# Patient Record
Sex: Male | Born: 1955 | Race: White | Hispanic: No | Marital: Single | Smoking: Former smoker
Health system: Southern US, Community
[De-identification: ages and names within clinical notes are randomized; demographics above are authoritative.]

---

## 2002-03-22 HISTORY — PX: CORONARY ARTERY BYPASS GRAFT: SHX141

## 2009-11-19 ENCOUNTER — Ambulatory Visit: Payer: Self-pay | Admitting: Internal Medicine

## 2011-03-23 HISTORY — PX: CORONARY ANGIOPLASTY WITH STENT PLACEMENT: SHX49

## 2012-02-10 DIAGNOSIS — I25810 Atherosclerosis of coronary artery bypass graft(s) without angina pectoris: Secondary | ICD-10-CM | POA: Insufficient documentation

## 2012-08-08 LAB — HM COLONOSCOPY: HM Colonoscopy: NORMAL

## 2014-01-07 LAB — LIPID PANEL
Cholesterol: 173 mg/dL (ref 0–200)
HDL: 28 mg/dL — AB (ref 35–70)
LDL CALC: 107 mg/dL
TRIGLYCERIDES: 191 mg/dL — AB (ref 40–160)

## 2014-01-07 LAB — PSA: PSA: 1.6

## 2014-05-17 LAB — HEMOGLOBIN A1C: Hgb A1c MFr Bld: 7.2 % — AB (ref 4.0–6.0)

## 2014-08-05 ENCOUNTER — Encounter: Payer: Self-pay | Admitting: Internal Medicine

## 2014-08-05 DIAGNOSIS — J309 Allergic rhinitis, unspecified: Secondary | ICD-10-CM | POA: Insufficient documentation

## 2014-08-05 DIAGNOSIS — I10 Essential (primary) hypertension: Secondary | ICD-10-CM | POA: Insufficient documentation

## 2014-08-05 DIAGNOSIS — E785 Hyperlipidemia, unspecified: Secondary | ICD-10-CM

## 2014-08-05 DIAGNOSIS — Z86010 Personal history of colonic polyps: Secondary | ICD-10-CM | POA: Insufficient documentation

## 2014-08-05 DIAGNOSIS — M436 Torticollis: Secondary | ICD-10-CM | POA: Insufficient documentation

## 2014-08-05 DIAGNOSIS — I2510 Atherosclerotic heart disease of native coronary artery without angina pectoris: Secondary | ICD-10-CM | POA: Insufficient documentation

## 2014-08-05 DIAGNOSIS — R4586 Emotional lability: Secondary | ICD-10-CM | POA: Insufficient documentation

## 2014-08-05 DIAGNOSIS — E1169 Type 2 diabetes mellitus with other specified complication: Secondary | ICD-10-CM | POA: Insufficient documentation

## 2014-08-05 DIAGNOSIS — N529 Male erectile dysfunction, unspecified: Secondary | ICD-10-CM | POA: Insufficient documentation

## 2014-08-05 DIAGNOSIS — K219 Gastro-esophageal reflux disease without esophagitis: Secondary | ICD-10-CM | POA: Insufficient documentation

## 2014-08-05 DIAGNOSIS — IMO0002 Reserved for concepts with insufficient information to code with codable children: Secondary | ICD-10-CM | POA: Insufficient documentation

## 2014-08-05 DIAGNOSIS — E1165 Type 2 diabetes mellitus with hyperglycemia: Secondary | ICD-10-CM | POA: Insufficient documentation

## 2014-08-05 DIAGNOSIS — G4733 Obstructive sleep apnea (adult) (pediatric): Secondary | ICD-10-CM | POA: Insufficient documentation

## 2014-08-05 DIAGNOSIS — N50819 Testicular pain, unspecified: Secondary | ICD-10-CM | POA: Insufficient documentation

## 2014-08-16 HISTORY — PX: COLONOSCOPY: SHX174

## 2014-09-18 ENCOUNTER — Encounter: Payer: Self-pay | Admitting: Internal Medicine

## 2014-09-18 ENCOUNTER — Ambulatory Visit: Payer: Self-pay | Admitting: Internal Medicine

## 2014-09-26 ENCOUNTER — Other Ambulatory Visit: Payer: Self-pay | Admitting: Internal Medicine

## 2014-10-09 ENCOUNTER — Other Ambulatory Visit: Payer: Self-pay | Admitting: Internal Medicine

## 2014-10-09 ENCOUNTER — Ambulatory Visit (INDEPENDENT_AMBULATORY_CARE_PROVIDER_SITE_OTHER): Payer: BLUE CROSS/BLUE SHIELD | Admitting: Internal Medicine

## 2014-10-09 ENCOUNTER — Encounter: Payer: Self-pay | Admitting: Internal Medicine

## 2014-10-09 VITALS — BP 116/62 | HR 78 | Ht 71.0 in | Wt 261.2 lb

## 2014-10-09 DIAGNOSIS — IMO0002 Reserved for concepts with insufficient information to code with codable children: Secondary | ICD-10-CM

## 2014-10-09 DIAGNOSIS — R319 Hematuria, unspecified: Secondary | ICD-10-CM

## 2014-10-09 DIAGNOSIS — E1165 Type 2 diabetes mellitus with hyperglycemia: Secondary | ICD-10-CM | POA: Diagnosis not present

## 2014-10-09 LAB — POC URINALYSIS WITH MICROSCOPIC (NON AUTO)MANUAL RESULT
CRYSTALS: 0
EPITHELIAL CELLS, URINE PER MICROSCOPY: 0
Mucus, UA: 0
RBC: 100 M/uL — AB (ref 4.69–6.13)
WBC Casts, UA: 2

## 2014-10-09 MED ORDER — CIPROFLOXACIN HCL 250 MG PO TABS: 250.0000 mg | ORAL_TABLET | Freq: Two times a day (BID) | ORAL | Status: DC

## 2014-10-09 NOTE — Progress Notes (Signed)
Date:  10/09/2014   Name:  Wyatt ProseMichael D Halberstam   DOB:  01-Aug-1955   MRN:  [id number]   Chief Complaint: Diabetes and Hematuria Diabetes He presents for his follow-up diabetic visit. He has type 2 diabetes mellitus. His disease course has been stable. Pertinent negatives for diabetes include no chest pain and no weakness. Symptoms are stable. His weight is decreasing steadily (has lost 10 lbs.). He participates in exercise daily. His breakfast blood glucose is taken between 6-7 am. His breakfast blood glucose range is generally 110-130 mg/dl.  Hematuria This is a new problem. The current episode started in the past 7 days. The problem is unchanged. He describes the hematuria as gross hematuria. He reports no clotting in his urine stream. He is experiencing no pain. He describes his urine color as tea colored. Irritative symptoms include nocturia. Obstructive symptoms do not include incomplete emptying or a slower stream. Pertinent negatives include no abdominal pain, fever, flank pain or genital pain.     Review of Systems:  Review of Systems  Constitutional: Negative for fever.  HENT: Negative for tinnitus.   Respiratory: Negative for shortness of breath.   Cardiovascular: Negative for chest pain.  Gastrointestinal: Negative for abdominal pain, constipation and blood in stool.  Genitourinary: Positive for hematuria and nocturia. Negative for flank pain and incomplete emptying.  Musculoskeletal: Positive for back pain (from overwork). Negative for arthralgias.  Neurological: Negative for weakness and light-headedness.    Patient Active Problem List   Diagnosis Date Noted  . Allergic rhinitis 08/05/2014  . CAD in native artery 08/05/2014  . Diabetes mellitus type 2, uncontrolled 08/05/2014  . Dyslipidemia 08/05/2014  . Epididymal pain 08/05/2014  . 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 Essential (primary) hypertension 08/05/2014  . Acid reflux 08/05/2014  . History of  colon polyps 08/05/2014  . Mood altered 08/05/2014  . Obstructive apnea 08/05/2014  . Neck rigid 08/05/2014  . 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 Authorizing Provider  amLODipine (NORVASC) 5 MG tablet Take 1 tablet by mouth daily. 03/27/14  Yes Historical Provider, MD  atorvastatin (LIPITOR) 80 MG tablet Take 1 tablet by mouth at bedtime. 03/27/14  Yes Historical Provider, MD  Choline Fenofibrate 135 MG capsule Take 1 tablet by mouth daily. 07/26/14  Yes Historical Provider, MD  clopidogrel (PLAVIX) 75 MG tablet Take 1 tablet by mouth daily. 06/26/14  Yes Historical Provider, MD  gabapentin (NEURONTIN) 300 MG capsule Take 1 capsule by mouth daily.   Yes Historical Provider, MD  glucose blood test strip Place 1 each onto the skin daily. 01/11/14  Yes Historical Provider, MD  lisinopril (PRINIVIL,ZESTRIL) 10 MG tablet Take 1 tablet by mouth daily. 06/26/14  Yes Historical Provider, MD  metoprolol succinate (TOPROL-XL) 100 MG 24 hr tablet TAKE 1 TABLET BY MOUTH TWICE DAILY 09/26/14  Yes Reubin MilanLaura H Shikara Mcauliffe, MD  Niacin CR 1000 MG TBCR Take 1 tablet by mouth at bedtime. 06/28/14  Yes Historical Provider, MD  SitaGLIPtin-MetFORMIN HCl [phone] MG TB24 Take 1 tablet by mouth daily. 07/26/14  Yes Historical Provider, MD  VIAGRA 100 MG tablet Take 1 tablet by mouth daily as needed. 09/26/14  Yes Historical Provider, MD    No Known Allergies  Past Surgical History  Procedure Laterality Date  . Coronary artery bypass graft  2004  . Coronary angioplasty with stent placement  2013  History  Substance Use Topics  . Smoking status: Former Games developer  . Smokeless tobacco: Not on file  . Alcohol Use: No     Medication list has been reviewed and updated.  Physical Examination:  Physical Exam  Constitutional: He is oriented to person, place, and time. He appears well-developed and well-nourished.  No distress.  HENT:  Head: Normocephalic and atraumatic.  Eyes: Conjunctivae are normal. Right eye exhibits no discharge. Left eye exhibits no discharge. No scleral icterus.  Neck: Normal range of motion. Neck supple. No thyromegaly present.  Cardiovascular: Normal rate, regular rhythm and normal heart sounds.   Pulmonary/Chest: Effort normal and breath sounds normal. No respiratory distress. He has no wheezes.  Abdominal: Soft. Bowel sounds are normal. There is no tenderness. There is no rebound and no guarding.  Musculoskeletal: Normal range of motion. He exhibits no edema or tenderness.  Neurological: He is alert and oriented to person, place, and time.  Skin: Skin is warm and dry. No rash noted.  Psychiatric: He has a normal mood and affect. His behavior is normal. Thought content normal.    BP 116/62 mmHg  Pulse 78  Ht  (1.803 m)  Wt 261 lb 3.2 oz (118.48 kg)  BMI 36.45 kg/m2  Assessment and Plan: 1. Hematuria Ordered Renal US from [facility] in [place] Treat with Cipro empirically and refer to Dr Renaldo Reel at [facility] - POC urinalysis w microscopic (non auto) - Ambulatory referral to Urology  2. Diabetes mellitus type 2, uncontrolled Will check at [facility] next week   Bari Edward, MD [facility]  10/09/2014

## 2014-10-15 ENCOUNTER — Other Ambulatory Visit: Payer: Self-pay | Admitting: Internal Medicine

## 2014-10-15 ENCOUNTER — Encounter: Payer: Self-pay | Admitting: Internal Medicine

## 2014-10-15 DIAGNOSIS — R310 Gross hematuria: Secondary | ICD-10-CM | POA: Insufficient documentation

## 2014-10-15 MED ORDER — WALGREENS ULTRA THIN LANCETS MISC: 1.0000 | Freq: Every day | Status: DC

## 2014-10-28 ENCOUNTER — Other Ambulatory Visit: Payer: Self-pay | Admitting: Internal Medicine

## 2015-01-03 ENCOUNTER — Other Ambulatory Visit: Payer: Self-pay | Admitting: Internal Medicine

## 2015-01-10 ENCOUNTER — Ambulatory Visit (INDEPENDENT_AMBULATORY_CARE_PROVIDER_SITE_OTHER): Payer: BLUE CROSS/BLUE SHIELD | Admitting: Internal Medicine

## 2015-01-10 ENCOUNTER — Encounter: Payer: Self-pay | Admitting: Internal Medicine

## 2015-01-10 VITALS — BP 110/62 | HR 68 | Ht 71.0 in | Wt 259.8 lb

## 2015-01-10 DIAGNOSIS — IMO0001 Reserved for inherently not codable concepts without codable children: Secondary | ICD-10-CM

## 2015-01-10 DIAGNOSIS — I10 Essential (primary) hypertension: Secondary | ICD-10-CM | POA: Diagnosis not present

## 2015-01-10 DIAGNOSIS — Z23 Encounter for immunization: Secondary | ICD-10-CM | POA: Diagnosis not present

## 2015-01-10 DIAGNOSIS — R310 Gross hematuria: Secondary | ICD-10-CM

## 2015-01-10 DIAGNOSIS — Z125 Encounter for screening for malignant neoplasm of prostate: Secondary | ICD-10-CM

## 2015-01-10 DIAGNOSIS — E785 Hyperlipidemia, unspecified: Secondary | ICD-10-CM

## 2015-01-10 DIAGNOSIS — E1165 Type 2 diabetes mellitus with hyperglycemia: Secondary | ICD-10-CM

## 2015-01-10 DIAGNOSIS — I25709 Atherosclerosis of coronary artery bypass graft(s), unspecified, with unspecified angina pectoris: Secondary | ICD-10-CM

## 2015-01-10 DIAGNOSIS — Z0000 Encounter for general adult medical examination without abnormal findings: Secondary | ICD-10-CM

## 2015-01-10 LAB — POCT URINALYSIS DIPSTICK
Bilirubin, UA: 0
Blood, UA: NEGATIVE
GLUCOSE UA: 0
Ketones, UA: 0
LEUKOCYTES UA: NEGATIVE
Nitrite, UA: 0
SPEC GRAV UA: 1.025
UROBILINOGEN UA: 0.2
pH, UA: 6

## 2015-01-10 MED ORDER — LISINOPRIL 10 MG PO TABS: 10.0000 mg | ORAL_TABLET | Freq: Every day | ORAL | Status: DC

## 2015-01-10 MED ORDER — CLOPIDOGREL BISULFATE 75 MG PO TABS: 75.0000 mg | ORAL_TABLET | Freq: Every day | ORAL | Status: DC

## 2015-01-10 MED ORDER — SITAGLIP PHOS-METFORMIN HCL ER 100-1000 MG PO TB24: 1.0000 | ORAL_TABLET | Freq: Every day | ORAL | Status: DC

## 2015-01-10 MED ORDER — ATORVASTATIN CALCIUM 80 MG PO TABS
80.0000 mg | ORAL_TABLET | Freq: Every day | ORAL | Status: DC
Start: 2015-01-10 — End: 2015-03-28

## 2015-01-10 MED ORDER — AMLODIPINE BESYLATE 5 MG PO TABS: 5.0000 mg | ORAL_TABLET | Freq: Every day | ORAL | Status: DC

## 2015-01-10 MED ORDER — NIACIN ER 1000 MG PO TBCR: 1.0000 | EXTENDED_RELEASE_TABLET | Freq: Every day | ORAL | Status: DC

## 2015-01-10 NOTE — Progress Notes (Signed)
Date:  01/10/2015   Name:  Wyatt Mata   DOB:  Mar 20, 1956   MRN:  [id number]   Chief Complaint: Annual Exam; Hypertension; Diabetes; and Hyperlipidemia Wyatt Mata is a 59 y.o. male who presents today for his Complete Annual Exam. He feels fairly well. He reports exercising none. He reports he is sleeping well. He is concerned about prostate cancer because his brother is fighting it now.  Hypertension This is a chronic problem. The current episode started more than 1 year ago. The problem is unchanged. The problem is controlled. Associated symptoms include shortness of breath. Pertinent negatives include no headaches or neck pain. Risk factors for coronary artery disease include dyslipidemia and diabetes mellitus. Past treatments include calcium channel blockers, beta blockers and ACE inhibitors.  Diabetes He presents for his follow-up diabetic visit. He has type 2 diabetes mellitus. Pertinent negatives for hypoglycemia include no headaches, nervousness/anxiousness or tremors. Pertinent negatives for diabetes include no fatigue, no polydipsia and no polyuria. Current diabetic treatment includes oral agent (dual therapy). He is compliant with treatment most of the time. His weight is stable. His breakfast blood glucose is taken between 6-7 am. His breakfast blood glucose range is generally 110-130 mg/dl.  Hyperlipidemia This is a chronic problem. The current episode started more than 1 year ago. The problem is controlled. Recent lipid tests were reviewed and are normal. Associated symptoms include shortness of breath. Pertinent negatives include no myalgias. The current treatment provides significant improvement of lipids.  CAD He has been having some chest pain with pain in his right arm which is typical of his previous angina. He has an appoint with his cardiologist in the next week. He does not want nitroglycerin because it conflicts with Viagra. He does continue on all of his medications as  prescribed. Hematuria patient had an episode of gross hematuria about 3 months ago. He did not see urology because of a large upfront cost. He states he has had no further symptoms. Groin pain he has had chronic left testicular and groin pain which has been monitored by urology. He was prescribed gabapentin which he continues to take but with questionable benefit. Recently saw a chiropractor who administered a local heat treatment that relieves his symptoms for 2 weeks.   Review of Systems  Constitutional: Negative for fever, chills and fatigue.  HENT: Negative for hearing loss, sore throat, tinnitus and trouble swallowing.   Eyes: Negative for visual disturbance.  Respiratory: Positive for shortness of breath. Negative for cough, chest tightness and wheezing.   Gastrointestinal: Positive for abdominal pain. Negative for diarrhea and constipation.  Endocrine: Negative for polydipsia and polyuria.  Genitourinary: Positive for testicular pain. Negative for dysuria, urgency, frequency, hematuria, discharge, penile swelling and penile pain.  Musculoskeletal: Positive for arthralgias. Negative for myalgias and neck pain.  Skin: Negative for color change and rash.  Neurological: Negative for tremors, light-headedness and headaches.  Hematological: Negative for adenopathy.  Psychiatric/Behavioral: Negative for sleep disturbance and dysphoric mood. The patient is not nervous/anxious.     Patient Active Problem List   Diagnosis Date Noted  . Hematuria, gross 10/15/2014  . Allergic rhinitis 08/05/2014  . CAD in native artery 08/05/2014  . Diabetes mellitus type 2, uncontrolled (HCC) 08/05/2014  . Dyslipidemia 08/05/2014  . Epididymal pain 08/05/2014  . 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 Essential (primary) hypertension 08/05/2014  . Acid reflux 08/05/2014  . History of colon polyps 08/05/2014  . Mood altered (HCC) 08/05/2014  .  Obstructive apnea 08/05/2014  . Neck rigid  08/05/2014  . 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 Authorizing Provider  amLODipine (NORVASC) 5 MG tablet Take 1 tablet by mouth daily. 03/27/14  Yes Historical Provider, MD  atorvastatin (LIPITOR) 80 MG tablet Take 1 tablet by mouth at bedtime. 03/27/14  Yes Historical Provider, MD  BAYER CONTOUR NEXT TEST test strip USE DAILY AS DIRECTED TO CHECK BLOOD SUGAR 10/09/14  Yes Reubin Milan, MD  Choline Fenofibrate (FENOFIBRIC ACID) 135 MG CPDR TAKE ONE CAPSULE BY MOUTH EVERY DAY 01/03/15  Yes Reubin Milan, MD  clopidogrel (PLAVIX) 75 MG tablet Take 1 tablet by mouth daily. 06/26/14  Yes Historical Provider, MD  gabapentin (NEURONTIN) 300 MG capsule Take 1 capsule by mouth daily.   Yes Historical Provider, MD  JANUMET XR [phone] MG TB24 TAKE 1 TABLET BY MOUTH DAILY 01/03/15  Yes Reubin Milan, MD  lisinopril (PRINIVIL,ZESTRIL) 10 MG tablet Take 1 tablet by mouth daily. 06/26/14  Yes Historical Provider, MD  metoprolol succinate (TOPROL-XL) 100 MG 24 hr tablet TAKE 1 TABLET BY MOUTH TWICE DAILY 10/28/14  Yes Reubin Milan, MD  Niacin CR 1000 MG TBCR Take 1 tablet by mouth at bedtime. 06/28/14  Yes Historical Provider, MD  VIAGRA 100 MG tablet TAKE 1 TABLET BY MOUTH EVERY DAY AS NEEDED FOR ED 10/28/14  Yes Reubin Milan, MD  Rushie Chestnut ULTRA THIN LANCETS MISC 1 each by Does not apply route daily. 10/15/14  Yes Reubin Milan, MD    No Known Allergies  Past Surgical History  Procedure Laterality Date  . Coronary artery bypass graft  2004  . Coronary angioplasty with stent placement  2013    Social History  Substance Use Topics  . Smoking status: Former Games developer  . Smokeless tobacco: None  . Alcohol Use: No     Medication list has been reviewed and updated.   Physical Exam  Constitutional: He is oriented to person, place, and time. He appears well-developed. No  distress.  HENT:  Head: Normocephalic and atraumatic.  Right Ear: Tympanic membrane and ear canal normal.  Left Ear: Tympanic membrane and ear canal normal.  Nose: Nose normal. Right sinus exhibits no maxillary sinus tenderness and no frontal sinus tenderness. Left sinus exhibits no maxillary sinus tenderness and no frontal sinus tenderness.  Mouth/Throat: Uvula is midline and oropharynx is clear and moist.  Eyes: Conjunctivae are normal. Right eye exhibits no discharge. Left eye exhibits no discharge. No scleral icterus.  Neck: Normal range of motion. Neck supple. Carotid bruit is not present. No thyromegaly present.  Cardiovascular: Normal rate, regular rhythm and normal heart sounds.   Pulmonary/Chest: Effort normal and breath sounds normal. No respiratory distress. He exhibits no mass. Right breast exhibits no mass. Left breast exhibits no mass.  Abdominal: Soft. Normal appearance and bowel sounds are normal. There is no hepatosplenomegaly. There is tenderness in the left lower quadrant. There is no rigidity, no rebound and no guarding.  Genitourinary: Rectum normal and prostate normal.  Musculoskeletal: Normal range of motion. He exhibits no edema or tenderness.  Lymphadenopathy:    He has no cervical adenopathy.  Neurological: He is alert and oriented to person, place, and time. He has normal strength.  Skin: Skin is warm and dry. No rash noted.  Psychiatric: He has a normal mood and affect. His behavior is normal. Thought content  normal.  Nursing note and vitals reviewed.   BP 110/62 mmHg  Pulse 68  Ht 5\' 11"  (1.803 m)  Wt 259 lb 12.8 oz (117.845 kg)  BMI 36.25 kg/m2  Assessment and Plan: 1. Annual physical exam - POCT urinalysis dipstick  2. Flu vaccine need - Flu Vaccine QUAD 36+ mos PF IM (Fluarix & Fluzone Quad PF)  3. Coronary artery disease involving coronary bypass graft of native heart with angina pectoris [facility](HCC) Patient declines a prescription for nitroglycerin He  is aware of appropriate measures if pain becomes severe otherwise he'll see cardiology in the near future - CBC with Differential/Platelet - clopidogrel (PLAVIX) 75 MG tablet; Take 1 tablet (75 mg total) by mouth daily.  Dispense: 30 tablet; Refill: 12  4. Essential (primary) hypertension Controlled - Comprehensive metabolic panel - lisinopril (PRINIVIL,ZESTRIL) 10 MG tablet; Take 1 tablet (10 mg total) by mouth daily.  Dispense: 30 tablet; Refill: 12 - amLODipine (NORVASC) 5 MG tablet; Take 1 tablet (5 mg total) by mouth daily.  Dispense: 30 tablet; Refill: 12  5. Uncontrolled type 2 diabetes mellitus without complication, without long-term current use of insulin (HCC) Normal foot exam with blood sugars running relatively normal Will advise patient of medication change if needed - Hemoglobin A1c - TSH - Microalbumin / creatinine urine ratio - SitaGLIPtin-MetFORMIN HCl (JANUMET XR) [phone] MG TB24; Take 1 tablet by mouth daily.  Dispense: 30 tablet; Refill: 12  6. Dyslipidemia On appropriate statin therapy - Lipid panel - atorvastatin (LIPITOR) 80 MG tablet; Take 1 tablet (80 mg total) by mouth at bedtime.  Dispense: 30 tablet; Refill: 12  7. Hematuria, gross Resolved - will need urology follow-up if recurrent  8. Prostate cancer screening Normal DRE - PSA   Bari EdwardLaura Estill Llerena, MD [facility]  01/10/2015

## 2015-01-11 LAB — COMPREHENSIVE METABOLIC PANEL
ALK PHOS: 48 IU/L (ref 39–117)
ALT: 43 IU/L (ref 0–44)
AST: 23 IU/L (ref 0–40)
Albumin/Globulin Ratio: 1.9 (ref 1.1–2.5)
Albumin: 4.4 g/dL (ref 3.5–5.5)
BUN/Creatinine Ratio: 19 (ref 9–20)
BUN: 18 mg/dL (ref 6–24)
Bilirubin Total: 0.6 mg/dL (ref 0.0–1.2)
CALCIUM: 9.3 mg/dL (ref 8.7–10.2)
CO2: 21 mmol/L (ref 18–29)
CREATININE: 0.95 mg/dL (ref 0.76–1.27)
Chloride: 104 mmol/L (ref 97–106)
GFR calc Af Amer: 101 mL/min/{1.73_m2} (ref >59)
GFR, EST NON AFRICAN AMERICAN: 87 mL/min/{1.73_m2} (ref >59)
GLOBULIN, TOTAL: 2.3 g/dL (ref 1.5–4.5)
GLUCOSE: 100 mg/dL — AB (ref 65–99)
Potassium: 4.4 mmol/L (ref 3.5–5.2)
Sodium: 143 mmol/L (ref 136–144)
Total Protein: 6.7 g/dL (ref 6.0–8.5)

## 2015-01-11 LAB — CBC WITH DIFFERENTIAL/PLATELET
BASOS ABS: 0 10*3/uL (ref 0.0–0.2)
Basos: 0 %
EOS (ABSOLUTE): 0.2 10*3/uL (ref 0.0–0.4)
Eos: 3 %
HEMATOCRIT: 46.9 % (ref 37.5–51.0)
Hemoglobin: 16.3 g/dL (ref 12.6–17.7)
IMMATURE GRANULOCYTES: 0 %
Immature Grans (Abs): 0 10*3/uL (ref 0.0–0.1)
LYMPHS ABS: 1.2 10*3/uL (ref 0.7–3.1)
Lymphs: 23 %
MCH: 30.4 pg (ref 26.6–33.0)
MCHC: 34.8 g/dL (ref 31.5–35.7)
MCV: 87 fL (ref 79–97)
MONOS ABS: 0.8 10*3/uL (ref 0.1–0.9)
Monocytes: 15 %
NEUTROS PCT: 59 %
Neutrophils Absolute: 3.1 10*3/uL (ref 1.4–7.0)
PLATELETS: 156 10*3/uL (ref 150–379)
RBC: 5.37 x10E6/uL (ref 4.14–5.80)
RDW: 14.1 % (ref 12.3–15.4)
WBC: 5.2 10*3/uL (ref 3.4–10.8)

## 2015-01-11 LAB — LIPID PANEL
CHOLESTEROL TOTAL: 203 mg/dL — AB (ref 100–199)
Chol/HDL Ratio: 6.3 ratio units — ABNORMAL HIGH (ref 0.0–5.0)
HDL: 32 mg/dL — AB (ref >39)
LDL Calculated: 148 mg/dL — ABNORMAL HIGH (ref 0–99)
TRIGLYCERIDES: 115 mg/dL (ref 0–149)
VLDL Cholesterol Cal: 23 mg/dL (ref 5–40)

## 2015-01-11 LAB — MICROALBUMIN / CREATININE URINE RATIO
Creatinine, Urine: 183.7 mg/dL
MICROALB/CREAT RATIO: 9.7 mg/g{creat} (ref 0.0–30.0)
MICROALBUM., U, RANDOM: 17.8 ug/mL

## 2015-01-11 LAB — TSH: TSH: 0.874 u[IU]/mL (ref 0.450–4.500)

## 2015-01-11 LAB — HEMOGLOBIN A1C
ESTIMATED AVERAGE GLUCOSE: 157 mg/dL
HEMOGLOBIN A1C: 7.1 % — AB (ref 4.8–5.6)

## 2015-01-11 LAB — PSA: Prostate Specific Ag, Serum: 2 ng/mL (ref 0.0–4.0)

## 2015-01-17 ENCOUNTER — Other Ambulatory Visit: Payer: Self-pay | Admitting: Internal Medicine

## 2015-01-21 HISTORY — PX: CORONARY ANGIOPLASTY WITH STENT PLACEMENT: SHX49

## 2015-02-28 ENCOUNTER — Other Ambulatory Visit: Payer: Self-pay | Admitting: Internal Medicine

## 2015-03-28 ENCOUNTER — Other Ambulatory Visit: Payer: Self-pay | Admitting: Internal Medicine

## 2015-04-09 ENCOUNTER — Ambulatory Visit (INDEPENDENT_AMBULATORY_CARE_PROVIDER_SITE_OTHER): Payer: BLUE CROSS/BLUE SHIELD | Admitting: Internal Medicine

## 2015-04-09 ENCOUNTER — Encounter: Payer: Self-pay | Admitting: Internal Medicine

## 2015-04-09 VITALS — BP 106/60 | HR 70 | Ht 71.5 in | Wt 255.6 lb

## 2015-04-09 DIAGNOSIS — I2510 Atherosclerotic heart disease of native coronary artery without angina pectoris: Secondary | ICD-10-CM

## 2015-04-09 DIAGNOSIS — R42 Dizziness and giddiness: Secondary | ICD-10-CM

## 2015-04-09 DIAGNOSIS — F39 Unspecified mood [affective] disorder: Secondary | ICD-10-CM

## 2015-04-09 DIAGNOSIS — R4586 Emotional lability: Secondary | ICD-10-CM

## 2015-04-09 MED ORDER — MECLIZINE HCL 25 MG PO TABS: 25.0000 mg | ORAL_TABLET | Freq: Three times a day (TID) | ORAL | Status: DC | PRN

## 2015-04-09 NOTE — Patient Instructions (Signed)

## 2015-04-09 NOTE — Progress Notes (Signed)
Date:  04/09/2015   Name:  Wyatt Mata   DOB:  07-10-1955   MRN:  [id number]   Chief Complaint: Dizziness Dizziness This is a new problem. The current episode started yesterday. Episode frequency: 10 times today. The problem has been gradually worsening. Associated symptoms include nausea and vertigo. Pertinent negatives include no chest pain, chills, congestion, coughing, fatigue, fever or vomiting. Exacerbated by: turning the head or looking down.   Mood Disorder - he is feeling depressed and tearful since November when he had 2 more cardiac stents placed.  All of his male family members have died before the age of 58 and his 39th birthday is in April. He decided to see a psychiatrist later this month in [place].   Review of Systems  Constitutional: Negative for fever, chills and fatigue.  HENT: Positive for tinnitus. Negative for congestion, ear pain, sinus pressure, trouble swallowing and voice change.   Eyes: Negative for visual disturbance.  Respiratory: Negative for cough and shortness of breath.   Cardiovascular: Negative for chest pain.  Gastrointestinal: Positive for nausea. Negative for vomiting.  Neurological: Positive for dizziness and vertigo.  Psychiatric/Behavioral: Positive for dysphoric mood. Negative for suicidal ideas. The patient is not nervous/anxious.     Patient Active Problem List   Diagnosis Date Noted  . Hematuria, gross 10/15/2014  . Allergic rhinitis 08/05/2014  . CAD in native artery 08/05/2014  . Diabetes mellitus type 2, uncontrolled (HCC) 08/05/2014  . Dyslipidemia 08/05/2014  . Epididymal pain 08/05/2014  . 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 Essential (primary) hypertension 08/05/2014  . Acid reflux 08/05/2014  . History of colon polyps 08/05/2014  . Mood altered (HCC) 08/05/2014  . Obstructive apnea 08/05/2014  . 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 Authorizing Provider  amLODipine (NORVASC) 5 MG tablet TAKE 1 TABLET BY MOUTH EVERY DAY 03/28/15  Yes Reubin Milan, MD  atorvastatin (LIPITOR) 80 MG tablet TAKE 1 TABLET BY MOUTH EVERY NIGHT AT BEDTIME 03/28/15  Yes Reubin Milan, MD  BAYER CONTOUR NEXT TEST test strip USE TO TEST BLOOD GLUCOSE ONCE DAILY. 01/17/15  Yes Reubin Milan, MD  Choline Fenofibrate (FENOFIBRIC ACID) 135 MG CPDR TAKE 1 CAPSULE BY MOUTH EVERY DAY 02/28/15  Yes Reubin Milan, MD  clopidogrel (PLAVIX) 75 MG tablet Take 1 tablet (75 mg total) by mouth daily. 01/10/15  Yes Reubin Milan, MD  gabapentin (NEURONTIN) 300 MG capsule Take 1 capsule by mouth daily.   Yes Historical Provider, MD  lisinopril (PRINIVIL,ZESTRIL) 10 MG tablet Take 1 tablet (10 mg total) by mouth daily. 01/10/15  Yes Reubin Milan, MD  metoprolol succinate (TOPROL-XL) 100 MG 24 hr tablet TAKE 1 TABLET BY MOUTH TWICE DAILY 10/28/14  Yes Reubin Milan, MD  Niacin CR 1000 MG TBCR Take 1 tablet (1,000 mg total) by mouth at bedtime. 01/10/15  Yes Reubin Milan, MD  SitaGLIPtin-MetFORMIN HCl (JANUMET XR) [phone] MG TB24 Take 1 tablet by mouth daily. 01/10/15  Yes Reubin Milan, MD  VIAGRA 100 MG tablet TAKE 1 TABLET BY MOUTH EVERY DAY AS NEEDED FOR ED 10/28/14  Yes Reubin Milan, MD  Rushie Chestnut ULTRA THIN LANCETS MISC 1 each by Does not apply route daily. 10/15/14  Yes Reubin Milan, MD    No Known Allergies  Past Surgical History  Procedure Laterality Date  .  Coronary artery bypass graft  2004  . Coronary angioplasty with stent placement  2013    Social History  Substance Use Topics  . Smoking status: Former Games developer  . Smokeless tobacco: None  . Alcohol Use: No     Medication list has been reviewed and updated.   Physical Exam  Constitutional: He is oriented to person, place, and time. He appears well-developed. No distress.  HENT:  Head: Normocephalic  and atraumatic.  Right Ear: Tympanic membrane and ear canal normal.  Left Ear: Tympanic membrane and ear canal normal.  Mouth/Throat: Oropharynx is clear and moist.  Eyes: Pupils are equal, round, and reactive to light. Right eye exhibits nystagmus.  Cardiovascular: Normal rate.   Pulmonary/Chest: Effort normal and breath sounds normal. No respiratory distress.  Musculoskeletal: Normal range of motion.  Neurological: He is alert and oriented to person, place, and time.  Skin: Skin is warm and dry. No rash noted.  Psychiatric: He has a normal mood and affect. His behavior is normal. Thought content normal.    BP 106/60 mmHg  Pulse 70  Ht 5' 11.5" (1.816 m)  Wt 255 lb 9.6 oz (115.939 kg)  BMI 35.16 kg/m2  Assessment and Plan: 1. Vertigo Patient educated - should resolve over the next 7-10 days - meclizine (ANTIVERT) 25 MG tablet; Take 1 tablet (25 mg total) by mouth 3 (three) times daily as needed for dizziness.  Dispense: 30 tablet; Refill: 0  2. Mood altered (HCC) Consult CBC as planned  3. CAD in native artery S/p 2 stents and doing well Continue current medications Return OV in one month as planned   Bari Edward, MD [facility]  04/09/2015

## 2015-04-27 ENCOUNTER — Other Ambulatory Visit: Payer: Self-pay | Admitting: Internal Medicine

## 2015-05-16 ENCOUNTER — Encounter: Payer: Self-pay | Admitting: Internal Medicine

## 2015-05-16 ENCOUNTER — Ambulatory Visit (INDEPENDENT_AMBULATORY_CARE_PROVIDER_SITE_OTHER): Payer: BLUE CROSS/BLUE SHIELD | Admitting: Internal Medicine

## 2015-05-16 VITALS — BP 104/68 | HR 80 | Ht 71.5 in | Wt 269.0 lb

## 2015-05-16 DIAGNOSIS — R4586 Emotional lability: Secondary | ICD-10-CM

## 2015-05-16 DIAGNOSIS — I10 Essential (primary) hypertension: Secondary | ICD-10-CM | POA: Diagnosis not present

## 2015-05-16 DIAGNOSIS — Z1159 Encounter for screening for other viral diseases: Secondary | ICD-10-CM

## 2015-05-16 DIAGNOSIS — F39 Unspecified mood [affective] disorder: Secondary | ICD-10-CM

## 2015-05-16 DIAGNOSIS — E1165 Type 2 diabetes mellitus with hyperglycemia: Secondary | ICD-10-CM | POA: Diagnosis not present

## 2015-05-16 DIAGNOSIS — IMO0001 Reserved for inherently not codable concepts without codable children: Secondary | ICD-10-CM

## 2015-05-16 NOTE — Progress Notes (Signed)
Date:  05/16/2015   Name:  Wyatt Mata   DOB:  1955-07-19   MRN:  [id number]   Chief Complaint: Follow-up; Hypertension; and Hyperlipidemia Hypertension This is a chronic problem. The current episode started more than 1 year ago. The problem is unchanged. The problem is controlled. Pertinent negatives include no chest pain, palpitations or shortness of breath.  Hyperlipidemia This is a chronic problem. The problem is controlled. Recent lipid tests were reviewed and are normal. Pertinent negatives include no chest pain or shortness of breath.  Diabetes He presents for his follow-up diabetic visit. He has type 2 diabetes mellitus. His disease course has been stable. Pertinent negatives for hypoglycemia include no dizziness or tremors. Pertinent negatives for diabetes include no chest pain and no fatigue. There is no change in his home blood glucose trend. His breakfast blood glucose is taken between 7-8 am. His breakfast blood glucose range is generally 110-130 mg/dl. An ACE inhibitor/angiotensin II receptor blocker is being taken.   Now in counseling for depression.  He has had two visits and feels that it is helpful.  No medication has been recommended. Lab Results  Component Value Date   HGBA1C 7.1* 01/10/2015   Lab Results  Component Value Date   CHOL 203* 01/10/2015   HDL 32* 01/10/2015   LDLCALC 148* 01/10/2015   TRIG 115 01/10/2015   CHOLHDL 6.3* 01/10/2015     Review of Systems  Constitutional: Negative for fever, chills and fatigue.  HENT: Negative for hearing loss and tinnitus.   Eyes: Negative for visual disturbance.  Respiratory: Negative for cough, chest tightness and shortness of breath.   Cardiovascular: Negative for chest pain and palpitations.  Gastrointestinal: Negative for abdominal pain, diarrhea and constipation.  Genitourinary: Negative for dysuria and hematuria.  Neurological: Negative for dizziness, tremors, light-headedness and numbness.    Psychiatric/Behavioral: Negative for behavioral problems and sleep disturbance.    Patient Active Problem List   Diagnosis Date Noted  . Hematuria, gross 10/15/2014  . Allergic rhinitis 08/05/2014  . CAD in native artery 08/05/2014  . Diabetes mellitus type 2, uncontrolled (HCC) 08/05/2014  . Dyslipidemia 08/05/2014  . Epididymal pain 08/05/2014  . 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 Essential (primary) hypertension 08/05/2014  . Acid reflux 08/05/2014  . History of colon polyps 08/05/2014  . Mood altered (HCC) 08/05/2014  . Obstructive apnea 08/05/2014  . 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 Authorizing Provider  amLODipine (NORVASC) 5 MG tablet TAKE 1 TABLET BY MOUTH EVERY DAY 03/28/15  Yes Reubin Milan, MD  atorvastatin (LIPITOR) 80 MG tablet TAKE 1 TABLET BY MOUTH EVERY NIGHT AT BEDTIME 03/28/15  Yes Reubin Milan, MD  BAYER CONTOUR NEXT TEST test strip USE TO TEST BLOOD GLUCOSE ONCE DAILY. 01/17/15  Yes Reubin Milan, MD  Choline Fenofibrate (FENOFIBRIC ACID) 135 MG CPDR TAKE 1 CAPSULE BY MOUTH EVERY DAY 02/28/15  Yes Reubin Milan, MD  clopidogrel (PLAVIX) 75 MG tablet Take 1 tablet (75 mg total) by mouth daily. 01/10/15  Yes Reubin Milan, MD  gabapentin (NEURONTIN) 300 MG capsule Take 1 capsule by mouth daily.   Yes Historical Provider, MD  lisinopril (PRINIVIL,ZESTRIL) 10 MG tablet Take 1 tablet (10 mg total) by mouth daily. 01/10/15  Yes Reubin Milan, MD  metoprolol succinate (TOPROL-XL) 100 MG 24 hr tablet TAKE 1 TABLET BY  MOUTH TWICE DAILY 04/27/15  Yes Reubin Milan, MD  Niacin CR 1000 MG TBCR Take 1 tablet (1,000 mg total) by mouth at bedtime. 01/10/15  Yes Reubin Milan, MD  SitaGLIPtin-MetFORMIN HCl (JANUMET XR) [phone] MG TB24 Take 1 tablet by mouth daily. 01/10/15  Yes Reubin Milan, MD  VIAGRA 100 MG  tablet TAKE 1 TABLET BY MOUTH EVERY DAY AS NEEDED FOR ED 04/27/15  Yes Reubin Milan, MD  Rushie Chestnut ULTRA THIN LANCETS MISC 1 each by Does not apply route daily. 10/15/14  Yes Reubin Milan, MD    No Known Allergies  Past Surgical History  Procedure Laterality Date  . Coronary artery bypass graft  2004  . Coronary angioplasty with stent placement  2013  . Coronary angioplasty with stent placement  01/2015    2 stents  - post circ and OM2    Social History  Substance Use Topics  . Smoking status: Former Games developer  . Smokeless tobacco: None  . Alcohol Use: No    Medication list has been reviewed and updated.   Physical Exam  Constitutional: He is oriented to person, place, and time. He appears well-developed. No distress.  HENT:  Head: Normocephalic and atraumatic.  Neck: Normal range of motion. Neck supple. No thyromegaly present.  Cardiovascular: Normal rate, regular rhythm and normal heart sounds.   Pulmonary/Chest: Effort normal and breath sounds normal. No respiratory distress.  Musculoskeletal: Normal range of motion.  Lymphadenopathy:    He has no cervical adenopathy.  Neurological: He is alert and oriented to person, place, and time.  Skin: Skin is warm and dry. No rash noted.  Psychiatric: He has a normal mood and affect. His behavior is normal. Thought content normal.  Nursing note and vitals reviewed.   BP 104/68 mmHg  Pulse 80  Ht 5' 11.5" (1.816 m)  Wt 269 lb (122.018 kg)  BMI 37.00 kg/m2  Assessment and Plan: 1. Essential (primary) hypertension controlled  2. Uncontrolled type 2 diabetes mellitus without complication, without long-term current use of insulin (HCC) Patient encouraged to have annual eye exam  3. Mood altered (HCC) Doing better in counselling   Bari Edward, MD [facility]  05/16/2015

## 2015-05-16 NOTE — Patient Instructions (Signed)
Diabetic Retinopathy Diabetic retinopathy is a disease of the light-sensitive membrane at the back of the eye (retina). It is a complication of diabetes and a common cause of blindness. Early detection of the disease is key to keeping your eyes healthy.  CAUSES  Diabetic retinopathy is caused by blood sugar (glucose) levels that are too high over an extended period of time. High blood sugars cause damage to the small blood vessels of the retina, allowing blood to leak through the vessel walls. This causes visual impairment and eventually blindness. RISK FACTORS  High blood pressure.  Having diabetes for a long time.  Having poorly controlled blood sugars. SIGNS AND SYMPTOMS  In the early stages of diabetic retinopathy, there are often no symptoms. As the condition advances, symptoms may include:  Blurred vision. This is usually caused by a swelling due to abnormal blood glucose levels. The blurriness may go away when blood glucose levels return to normal.  Moving specks or dark spots (floaters) in your vision. These can be caused by a small retinal hemorrhage. A hemorrhage is bleeding from blood vessels.  Missing parts of your field of vision, such as things at the side. This can be caused by larger retinal hemorrhages.  Difficulty reading books or signs.  Double vision.  Pain in one or both eyes.  Feeling pressure in one or both eyes.  Trouble seeing straight lines. Straight lines do not look straight.  Redness of the eyes that does not go away. DIAGNOSIS  Your eye care specialist can detect changes in the blood vessels of your eye by putting drops in your eyes that enlarge (dilate) your pupils. This allows your eye care specialist to get a good look at your retina to see if there are any changes that have occurred as a result of your diabetes. You should have your eyes examined once a year. TREATMENT  Your eye care specialist may use a special laser beam to seal the blood vessels  of the retina and stop them from leaking. Early detection and treatment are important so that further damage to your eyes can be prevented. In addition, managing your blood sugars and keeping them in the target range can slow the progress of the disease. HOME CARE INSTRUCTIONS   Keep your blood pressure within your target range.  Keep your blood glucose levels within your target range.  Follow your health care provider's instructions regarding diet and other means for controlling your blood glucose levels.  Check your blood levels for glucose as recommended by your health care provider.  Keep regular appointments with your eye specialist. An eye specialist can usually see diabetic retinopathy developing long before it starts causing problems. In many cases, it can be treated to prevent complications from occurring. If you have diabetes, you should have your eyes checked at least every year. Your risk of retinopathy increases the longer you have the disease.  If you smoke, quit. Ask your health care provider for help if needed. Smoking can make retinopathy worse. SEEK MEDICAL CARE IF:   You notice gradual blurring or other changes in your vision over time.  You notice that your glasses or contact lenses do not make things look as sharp as they once did.  You have trouble reading or seeing details at a distance with either eye.  You notice a sudden change in your vision or notice that parts of your field of vision appear missing or hazy.  You suddenly see moving specks or dark spots   in the field of vision of either eye.  You have sudden partial loss of vision in either eye.   This information is not intended to replace advice given to you by your health care provider. Make sure you discuss any questions you have with your health care provider.   Document Released: 03/05/2000 Document Revised: 12/27/2012 Document Reviewed: 08/28/2012 Elsevier Interactive Patient Education 2016 [employer]  

## 2015-05-17 LAB — HEMOGLOBIN A1C
ESTIMATED AVERAGE GLUCOSE: 160 mg/dL
Hgb A1c MFr Bld: 7.2 % — ABNORMAL HIGH (ref 4.8–5.6)

## 2015-05-17 LAB — HEPATITIS C ANTIBODY: Hep C Virus Ab: <0.1 s/co ratio (ref 0.0–0.9)

## 2015-05-19 ENCOUNTER — Telehealth: Payer: Self-pay

## 2015-05-19 NOTE — Telephone Encounter (Signed)
-----   Message from Reubin Milan, MD sent at 05/17/2015 10:00 AM EST ----- DM is slightly worse - work more on diet but continue same medications.  Hep C is negative.

## 2015-05-20 NOTE — Telephone Encounter (Signed)
Left message for patient to call back  

## 2015-05-20 NOTE — Telephone Encounter (Signed)
Spoke with patient. Patient advised of all results and verbalized understanding. Will call back with any future questions or concerns. MA[facility]  

## 2015-05-31 ENCOUNTER — Other Ambulatory Visit: Payer: Self-pay | Admitting: Internal Medicine

## 2015-06-26 ENCOUNTER — Other Ambulatory Visit: Payer: Self-pay | Admitting: Internal Medicine

## 2015-07-08 ENCOUNTER — Other Ambulatory Visit: Payer: Self-pay | Admitting: Internal Medicine

## 2015-08-01 ENCOUNTER — Other Ambulatory Visit: Payer: Self-pay | Admitting: Internal Medicine

## 2015-08-31 ENCOUNTER — Other Ambulatory Visit: Payer: Self-pay | Admitting: Internal Medicine

## 2015-09-19 ENCOUNTER — Ambulatory Visit: Payer: Self-pay | Admitting: Internal Medicine

## 2015-10-01 ENCOUNTER — Other Ambulatory Visit: Payer: Self-pay | Admitting: Internal Medicine

## 2015-10-01 NOTE — Telephone Encounter (Signed)
pts coming in on 7/21

## 2015-10-10 ENCOUNTER — Encounter: Payer: Self-pay | Admitting: Internal Medicine

## 2015-10-10 ENCOUNTER — Ambulatory Visit (INDEPENDENT_AMBULATORY_CARE_PROVIDER_SITE_OTHER): Payer: BLUE CROSS/BLUE SHIELD | Admitting: Internal Medicine

## 2015-10-10 VITALS — BP 108/62 | HR 68 | Ht 71.5 in | Wt 268.8 lb

## 2015-10-10 DIAGNOSIS — F39 Unspecified mood [affective] disorder: Secondary | ICD-10-CM

## 2015-10-10 DIAGNOSIS — E291 Testicular hypofunction: Secondary | ICD-10-CM

## 2015-10-10 DIAGNOSIS — E1165 Type 2 diabetes mellitus with hyperglycemia: Secondary | ICD-10-CM | POA: Diagnosis not present

## 2015-10-10 DIAGNOSIS — N528 Other male erectile dysfunction: Secondary | ICD-10-CM

## 2015-10-10 DIAGNOSIS — R4586 Emotional lability: Secondary | ICD-10-CM

## 2015-10-10 DIAGNOSIS — E118 Type 2 diabetes mellitus with unspecified complications: Secondary | ICD-10-CM | POA: Insufficient documentation

## 2015-10-10 DIAGNOSIS — IMO0001 Reserved for inherently not codable concepts without codable children: Secondary | ICD-10-CM

## 2015-10-10 DIAGNOSIS — R7989 Other specified abnormal findings of blood chemistry: Secondary | ICD-10-CM

## 2015-10-10 DIAGNOSIS — N529 Male erectile dysfunction, unspecified: Secondary | ICD-10-CM

## 2015-10-10 DIAGNOSIS — I10 Essential (primary) hypertension: Secondary | ICD-10-CM

## 2015-10-10 MED ORDER — TADALAFIL 20 MG PO TABS: 20.0000 mg | ORAL_TABLET | Freq: Every day | ORAL | Status: DC | PRN

## 2015-10-10 NOTE — Progress Notes (Signed)
Date:  10/10/2015   Name:  Wyatt Mata   DOB:  1955-08-25   MRN:  [id number]   Chief Complaint: Diabetes; Hyperlipidemia; and Hypertension Diabetes He presents for his follow-up diabetic visit. He has type 2 diabetes mellitus. His disease course has been stable. Hypoglycemia symptoms include dizziness (mild in the am). Pertinent negatives for hypoglycemia include no nervousness/anxiousness. Pertinent negatives for diabetes include no chest pain, no fatigue, no polydipsia and no polyuria. Current diabetic treatment includes oral agent (dual therapy). He is compliant with treatment most of the time. He monitors blood glucose at home 1-2 x per week. His breakfast blood glucose is taken between 7-8 am. His breakfast blood glucose range is generally 130-140 mg/dl.  Hyperlipidemia Pertinent negatives include no chest pain or shortness of breath.  Hypertension Pertinent negatives include no chest pain, palpitations or shortness of breath.  ED - went to Urology for ED.  They recommended Caverject but he did not want to do that.  They then recommended Cialis.   He also had a low testosterone but did not want to have the pellet implants due to cost.  He does have depression and ED but no other s/s of low testosterone.  Lab Results  Component Value Date   HGBA1C 7.2* 05/16/2015    Review of Systems  Constitutional: Negative for fever, chills, fatigue and unexpected weight change.  HENT: Negative for tinnitus and trouble swallowing.   Eyes: Negative for visual disturbance.  Respiratory: Negative for chest tightness, shortness of breath and wheezing.   Cardiovascular: Negative for chest pain, palpitations and leg swelling.  Endocrine: Negative for polydipsia and polyuria.  Genitourinary:       ED  Musculoskeletal: Negative for arthralgias.  Skin: Negative for rash.  Neurological: Positive for dizziness (mild in the am).  Psychiatric/Behavioral: Negative for sleep disturbance and dysphoric  mood. The patient is not nervous/anxious.     Patient Active Problem List   Diagnosis Date Noted  . Uncontrolled type 2 diabetes mellitus without complication, without long-term current use of insulin (HCC) 10/10/2015  . Hematuria, gross 10/15/2014  . Allergic rhinitis 08/05/2014  . CAD in native artery 08/05/2014  . Dyslipidemia 08/05/2014  . Epididymal pain 08/05/2014  . 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 Essential (primary) hypertension 08/05/2014  . Acid reflux 08/05/2014  . History of colon polyps 08/05/2014  . Mood altered (HCC) 08/05/2014  . Obstructive apnea 08/05/2014  . 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 Authorizing Provider  amLODipine (NORVASC) 5 MG tablet TAKE 1 TABLET BY MOUTH EVERY DAY 10/01/15  Yes Reubin MilanLaura H Wojciech Willetts, MD  atorvastatin (LIPITOR) 80 MG tablet TAKE 1 TABLET BY MOUTH EVERY NIGHT AT BEDTIME 10/01/15  Yes Reubin MilanLaura H Zakariah Urwin, MD  BAYER CONTOUR NEXT TEST test strip USE TO TEST BLOOD GLUCOSE ONCE DAILY. 01/17/15  Yes Reubin MilanLaura H Mykah Bellomo, MD  Choline Fenofibrate (FENOFIBRIC ACID) 135 MG CPDR TAKE 1 CAPSULE BY MOUTH EVERY DAY 08/31/15  Yes Reubin MilanLaura H Peggyann Zwiefelhofer, MD  clopidogrel (PLAVIX) 75 MG tablet TAKE 1 TABLET BY MOUTH DAILY 06/26/15  Yes Reubin MilanLaura H Vaishali Baise, MD  fluticasone [facility](FLONASE) 50 MCG/ACT nasal spray PLACE 2 SPRAYS IN EACH NOSTRIL EVERY DAY. 07/08/15  Yes Reubin MilanLaura H Pocahontas Cohenour, MD  gabapentin (NEURONTIN) 300 MG capsule Take 1 capsule by mouth daily.   Yes Historical Provider, MD  lisinopril (PRINIVIL,ZESTRIL) 10 MG tablet TAKE 1  TABLET BY MOUTH DAILY 06/26/15  Yes Reubin Milan, MD  metoprolol succinate (TOPROL-XL) 100 MG 24 hr tablet TAKE 1 TABLET BY MOUTH TWICE DAILY 04/27/15  Yes Reubin Milan, MD  Niacin CR 1000 MG TBCR Take 1 tablet (1,000 mg total) by mouth at bedtime. 01/10/15  Yes Reubin Milan, MD  SitaGLIPtin-MetFORMIN HCl  (JANUMET XR) [phone] MG TB24 Take 1 tablet by mouth daily. 01/10/15  Yes Reubin Milan, MD  VIAGRA 100 MG tablet TAKE 1 TABLET BY MOUTH EVERY DAY AS NEEDED FOR ED 05/31/15  Yes Reubin Milan, MD  Rushie Chestnut ULTRA THIN LANCETS MISC 1 each by Does not apply route daily. 10/15/14  Yes Reubin Milan, MD  niacin (NIASPAN) 1000 MG CR tablet TAKE 1 TABLET BY MOUTH AT BEDTIME Patient not taking: Reported on 10/10/2015 08/01/15   Reubin Milan, MD    No Known Allergies  Past Surgical History  Procedure Laterality Date  . Coronary artery bypass graft  2004  . Coronary angioplasty with stent placement  2013  . Coronary angioplasty with stent placement  01/2015    2 stents  - post circ and OM2    Social History  Substance Use Topics  . Smoking status: Former Games developer  . Smokeless tobacco: None  . Alcohol Use: No    Medication list has been reviewed and updated.   Physical Exam  Constitutional: He is oriented to person, place, and time. He appears well-developed. No distress.  HENT:  Head: Normocephalic and atraumatic.  Neck: Normal range of motion. Neck supple. Carotid bruit is not present.  Cardiovascular: Normal rate, regular rhythm and normal heart sounds.   Pulmonary/Chest: Effort normal and breath sounds normal. No respiratory distress. He has no wheezes.  Musculoskeletal: He exhibits no edema.  Neurological: He is alert and oriented to person, place, and time.  Skin: Skin is warm and dry. No rash noted.  Psychiatric: He has a normal mood and affect. His behavior is normal. Thought content normal.  Nursing note and vitals reviewed.   BP 108/62 mmHg  Pulse 68  Ht 5' 11.5" (1.816 m)  Wt 268 lb 12.8 oz (121.927 kg)  BMI 36.97 kg/m2  Assessment and Plan: 1. Uncontrolled type 2 diabetes mellitus without complication, without long-term current use of insulin (HCC) Continue oral agents and work on diet and weight loss - Hemoglobin A1c  2. Essential (primary)  hypertension controlled  3. ED (erectile dysfunction) of organic origin Samples of cialis given - will likely need PA - tadalafil (CIALIS) 20 MG tablet; Take 1 tablet (20 mg total) by mouth daily as needed for erectile dysfunction.  Dispense: 10 tablet; Refill: 12  4. Low serum testosterone Recheck level; patient will send me his labs from Urology since will need 2 values to seek PA - Testosterone  5. Mood altered (HCC) Improved with counseling but still having bad days   Bari Edward, MD [facility]  10/10/2015

## 2015-10-11 LAB — HEMOGLOBIN A1C
Est. average glucose Bld gHb Est-mCnc: 169 mg/dL
Hgb A1c MFr Bld: 7.5 % — ABNORMAL HIGH (ref 4.8–5.6)

## 2015-10-11 LAB — TESTOSTERONE: TESTOSTERONE: 280 ng/dL (ref 264–916)

## 2015-10-24 ENCOUNTER — Other Ambulatory Visit: Payer: Self-pay

## 2015-10-24 DIAGNOSIS — N529 Male erectile dysfunction, unspecified: Secondary | ICD-10-CM

## 2015-10-24 MED ORDER — TADALAFIL 20 MG PO TABS: 20.0000 mg | ORAL_TABLET | Freq: Every day | ORAL | 12 refills | Status: DC | PRN

## 2015-10-30 ENCOUNTER — Other Ambulatory Visit: Payer: Self-pay | Admitting: Internal Medicine

## 2015-11-25 ENCOUNTER — Telehealth: Payer: Self-pay

## 2015-11-25 NOTE — Telephone Encounter (Signed)
Patient still dizzy. Can he go to ENT and who [facility]

## 2015-11-25 NOTE — Telephone Encounter (Signed)
[facility] [address] [place]

## 2015-12-09 ENCOUNTER — Other Ambulatory Visit: Payer: Self-pay | Admitting: Internal Medicine

## 2016-02-04 ENCOUNTER — Other Ambulatory Visit: Payer: Self-pay | Admitting: Internal Medicine

## 2016-02-04 DIAGNOSIS — E785 Hyperlipidemia, unspecified: Secondary | ICD-10-CM

## 2016-02-04 DIAGNOSIS — IMO0001 Reserved for inherently not codable concepts without codable children: Secondary | ICD-10-CM

## 2016-02-04 DIAGNOSIS — E1165 Type 2 diabetes mellitus with hyperglycemia: Secondary | ICD-10-CM

## 2016-02-04 DIAGNOSIS — I10 Essential (primary) hypertension: Secondary | ICD-10-CM

## 2016-02-25 ENCOUNTER — Encounter: Payer: Self-pay | Admitting: Internal Medicine

## 2016-05-16 ENCOUNTER — Other Ambulatory Visit: Payer: Self-pay | Admitting: Internal Medicine

## 2016-05-17 ENCOUNTER — Telehealth: Payer: Self-pay

## 2016-05-17 NOTE — Telephone Encounter (Signed)
Spoke to pt and let him know Clialis being denied through insurance. Told him he needs to make an appt to discuss this, and have a diabetes follow up. Told him he needs to be seen twice a year for his condition.

## 2016-05-18 NOTE — Telephone Encounter (Signed)
pts coming in on 3/16

## 2016-06-04 ENCOUNTER — Ambulatory Visit (INDEPENDENT_AMBULATORY_CARE_PROVIDER_SITE_OTHER): Payer: BLUE CROSS/BLUE SHIELD | Admitting: Internal Medicine

## 2016-06-04 ENCOUNTER — Encounter: Payer: Self-pay | Admitting: Internal Medicine

## 2016-06-04 VITALS — BP 121/78 | HR 81 | Resp 16 | Ht 71.0 in | Wt 272.2 lb

## 2016-06-04 DIAGNOSIS — E785 Hyperlipidemia, unspecified: Secondary | ICD-10-CM

## 2016-06-04 DIAGNOSIS — S161XXA Strain of muscle, fascia and tendon at neck level, initial encounter: Secondary | ICD-10-CM

## 2016-06-04 DIAGNOSIS — E1165 Type 2 diabetes mellitus with hyperglycemia: Secondary | ICD-10-CM | POA: Diagnosis not present

## 2016-06-04 DIAGNOSIS — I10 Essential (primary) hypertension: Secondary | ICD-10-CM | POA: Diagnosis not present

## 2016-06-04 DIAGNOSIS — IMO0001 Reserved for inherently not codable concepts without codable children: Secondary | ICD-10-CM

## 2016-06-04 DIAGNOSIS — I2510 Atherosclerotic heart disease of native coronary artery without angina pectoris: Secondary | ICD-10-CM | POA: Diagnosis not present

## 2016-06-04 MED ORDER — GABAPENTIN 300 MG PO CAPS: 300.0000 mg | ORAL_CAPSULE | Freq: Every day | ORAL | 5 refills | Status: DC

## 2016-06-04 MED ORDER — SILDENAFIL CITRATE 100 MG PO TABS: 50.0000 mg | ORAL_TABLET | Freq: Every day | ORAL | 11 refills | Status: DC | PRN

## 2016-06-04 NOTE — Patient Instructions (Signed)
Resume gabapentin Use heat on neck Continue Goody's powder 1-2 per day

## 2016-06-04 NOTE — Progress Notes (Signed)
Date:  06/04/2016   Name:  Wyatt Mata   DOB:  [birth year]   MRN:  [id number]   Chief Complaint: Diabetes and Hypertension Diabetes  He presents for his follow-up diabetic visit. He has type 2 diabetes mellitus. His disease course has been stable. There are no hypoglycemic associated symptoms. Pertinent negatives for hypoglycemia include no dizziness, headaches or tremors. Associated symptoms include chest pain (stable angina). Pertinent negatives for diabetes include no fatigue, no polydipsia, no polyuria and no weakness. He monitors blood glucose at home 3-4 x per week.  Hypertension  This is a chronic problem. The problem is unchanged. The problem is controlled. Associated symptoms include chest pain (stable angina) and neck pain. Pertinent negatives include no headaches, palpitations or shortness of breath. Risk factors for coronary artery disease include dyslipidemia, diabetes mellitus and obesity. Hypertensive end-organ damage includes CAD/MI.  Neck Pain   This is a new problem. The current episode started 1 to 4 weeks ago. The problem occurs intermittently. The problem has been unchanged. The pain is associated with nothing. The pain is present in the left side. The quality of the pain is described as cramping. The pain is mild. The symptoms are aggravated by twisting. Associated symptoms include chest pain (stable angina). Pertinent negatives include no headaches, numbness or weakness. He has tried NSAIDs for the symptoms. The treatment provided mild relief.    Lab Results  Component Value Date   HGBA1C 7.5 (H) 10/10/2015     Review of Systems  Constitutional: Negative for appetite change, fatigue and unexpected weight change.  Eyes: Negative for visual disturbance.  Respiratory: Negative for cough, shortness of breath and wheezing.   Cardiovascular: Positive for chest pain (stable angina). Negative for palpitations and leg swelling.  Gastrointestinal: Negative for abdominal  pain and blood in stool.  Endocrine: Negative for polydipsia and polyuria.  Genitourinary: Negative for dysuria and hematuria.  Musculoskeletal: Positive for neck pain and neck stiffness. Negative for gait problem.  Skin: Negative for color change and rash.  Neurological: Negative for dizziness, tremors, weakness, numbness and headaches.  Psychiatric/Behavioral: Negative for dysphoric mood.    Patient Active Problem List   Diagnosis Date Noted  . Uncontrolled type 2 diabetes mellitus without complication, without long-term current use of insulin (HCC) 10/10/2015  . Low serum testosterone 10/10/2015  . Hematuria, gross 10/15/2014  . Allergic rhinitis 08/05/2014  . CAD in native artery 08/05/2014  . Dyslipidemia 08/05/2014  . Epididymal pain 08/05/2014  . 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 Essential (primary) hypertension 08/05/2014  . Acid reflux 08/05/2014  . History of colon polyps 08/05/2014  . Mood altered (HCC) 08/05/2014  . Obstructive apnea 08/05/2014  . 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 Authorizing Provider  amLODipine (NORVASC) 5 MG tablet TAKE 1 TABLET(5 MG) BY MOUTH DAILY 02/04/16  Yes Reubin Milan, MD  atorvastatin (LIPITOR) 80 MG tablet TAKE 1 TABLET(80 MG) BY MOUTH AT BEDTIME 02/04/16  Yes Reubin Milan, MD  BAYER CONTOUR NEXT TEST test strip USE TO TEST BLOOD GLUCOSE ONCE DAILY 12/10/15  Yes Reubin Milan, MD  Choline Fenofibrate (FENOFIBRIC ACID) 135 MG CPDR Take 135 mg by mouth daily. 05/17/16  Yes Historical Provider, MD  clopidogrel (PLAVIX) 75 MG tablet TAKE 1 TABLET BY MOUTH DAILY 06/26/15  Yes Reubin Milan, MD  fluticasone [facility]) 50 MCG/ACT nasal spray  PLACE 2 SPRAYS IN EACH NOSTRIL EVERY DAY. 07/08/15  Yes Reubin MilanLaura H Vane Yapp, MD  gabapentin (NEURONTIN) 300 MG capsule Take 1 capsule by mouth daily.   Yes  Historical Provider, MD  JANUMET XR [phone] MG TB24 TAKE 1 TABLET BY MOUTH DAILY 02/04/16  Yes Reubin MilanLaura H Merrick Feutz, MD  lisinopril (PRINIVIL,ZESTRIL) 10 MG tablet TAKE 1 TABLET BY MOUTH DAILY 06/26/15  Yes Reubin MilanLaura H Ellese Julius, MD  metoprolol succinate (TOPROL-XL) 100 MG 24 hr tablet TAKE 1 TABLET BY MOUTH TWICE DAILY 05/16/16  Yes Reubin MilanLaura H Perri Aragones, MD  MICROLET LANCETS MISC USE EACH DAY 10/30/15  Yes Reubin MilanLaura H Aymee Fomby, MD  niacin (NIASPAN) 1000 MG CR tablet TAKE 1 TABLET BY MOUTH AT BEDTIME 02/04/16  Yes Reubin MilanLaura H Havish Petties, MD  tadalafil (CIALIS) 20 MG tablet Take 1 tablet (20 mg total) by mouth daily as needed for erectile dysfunction. 10/24/15  Yes Reubin MilanLaura H Magen Suriano, MD    No Known Allergies  Past Surgical History:  Procedure Laterality Date  . CORONARY ANGIOPLASTY WITH STENT PLACEMENT  2013  . CORONARY ANGIOPLASTY WITH STENT PLACEMENT  01/2015   2 stents  - post circ and OM2  . CORONARY ARTERY BYPASS GRAFT  2004    Social History  Substance Use Topics  . Smoking status: Former Games developermoker  . Smokeless tobacco: Never Used  . Alcohol use No     Medication list has been reviewed and updated.   Physical Exam  Constitutional: He is oriented to person, place, and time. He appears well-developed. No distress.  HENT:  Head: Normocephalic and atraumatic.  Cardiovascular: Normal rate, regular rhythm and normal heart sounds.   Pulmonary/Chest: Effort normal and breath sounds normal. No respiratory distress. He has no wheezes.  Musculoskeletal: Normal range of motion.       Cervical back: He exhibits tenderness and spasm.  Neurological: He is alert and oriented to person, place, and time. He has normal strength and normal reflexes.  Skin: Skin is warm and dry. No rash noted.  Psychiatric: He has a normal mood and affect. His behavior is normal. Thought content normal.  Nursing note and vitals reviewed.   BP 121/78   Pulse 81   Resp 16   Ht 5\' 11"  (1.803 m)   Wt 272 lb 3.2 oz (123.5 kg)   BMI  37.96 kg/m   Assessment and Plan: 1. Uncontrolled type 2 diabetes mellitus without complication, without long-term current use of insulin (HCC) Continue current therapy - Comprehensive metabolic panel - Hemoglobin A1c  2. CAD in native artery Stable; follow up with cardiology  3. Essential (primary) hypertension controlled - CBC with Differential/Platelet  4. Dyslipidemia On triple therapy - Lipid panel  5. Strain of neck muscle, initial encounter Continue anti-inflammatories Use heat 20 minutes three times a day   Meds ordered this encounter  Medications  . gabapentin (NEURONTIN) 300 MG capsule    Sig: Take 1 capsule (300 mg total) by mouth daily.    Dispense:  30 capsule    Refill:  5  . sildenafil (VIAGRA) 100 MG tablet    Sig: Take 0.5-1 tablets (50-100 mg total) by mouth daily as needed for erectile dysfunction.    Dispense:  6 tablet    Refill:  11    Bari EdwardLaura Shetara Launer, MD [facility]  06/04/2016

## 2016-06-05 LAB — CBC WITH DIFFERENTIAL/PLATELET
BASOS: 0 %
Basophils Absolute: 0 10*3/uL (ref 0.0–0.2)
EOS (ABSOLUTE): 0.2 10*3/uL (ref 0.0–0.4)
Eos: 4 %
Hematocrit: 44.9 % (ref 37.5–51.0)
Hemoglobin: 15.5 g/dL (ref 13.0–17.7)
IMMATURE GRANS (ABS): 0 10*3/uL (ref 0.0–0.1)
Immature Granulocytes: 0 %
LYMPHS: 28 %
Lymphocytes Absolute: 1.2 10*3/uL (ref 0.7–3.1)
MCH: 30.2 pg (ref 26.6–33.0)
MCHC: 34.5 g/dL (ref 31.5–35.7)
MCV: 88 fL (ref 79–97)
MONOCYTES: 14 %
Monocytes Absolute: 0.6 10*3/uL (ref 0.1–0.9)
NEUTROS ABS: 2.4 10*3/uL (ref 1.4–7.0)
NEUTROS PCT: 54 %
PLATELETS: 130 10*3/uL — AB (ref 150–379)
RBC: 5.13 x10E6/uL (ref 4.14–5.80)
RDW: 15.1 % (ref 12.3–15.4)
WBC: 4.5 10*3/uL (ref 3.4–10.8)

## 2016-06-05 LAB — COMPREHENSIVE METABOLIC PANEL
ALK PHOS: 51 IU/L (ref 39–117)
ALT: 42 IU/L (ref 0–44)
AST: 27 IU/L (ref 0–40)
Albumin/Globulin Ratio: 1.7 (ref 1.2–2.2)
Albumin: 4 g/dL (ref 3.6–4.8)
BILIRUBIN TOTAL: 0.5 mg/dL (ref 0.0–1.2)
BUN/Creatinine Ratio: 23 (ref 10–24)
BUN: 22 mg/dL (ref 8–27)
CHLORIDE: 102 mmol/L (ref 96–106)
CO2: 21 mmol/L (ref 18–29)
CREATININE: 0.97 mg/dL (ref 0.76–1.27)
Calcium: 8.9 mg/dL (ref 8.6–10.2)
GFR calc Af Amer: 98 mL/min/{1.73_m2} (ref >59)
GFR calc non Af Amer: 84 mL/min/{1.73_m2} (ref >59)
GLUCOSE: 134 mg/dL — AB (ref 65–99)
Globulin, Total: 2.4 g/dL (ref 1.5–4.5)
Potassium: 4.4 mmol/L (ref 3.5–5.2)
Sodium: 139 mmol/L (ref 134–144)
TOTAL PROTEIN: 6.4 g/dL (ref 6.0–8.5)

## 2016-06-05 LAB — LIPID PANEL
CHOLESTEROL TOTAL: 185 mg/dL (ref 100–199)
Chol/HDL Ratio: 6.4 ratio units — ABNORMAL HIGH (ref 0.0–5.0)
HDL: 29 mg/dL — ABNORMAL LOW (ref >39)
LDL Calculated: 123 mg/dL — ABNORMAL HIGH (ref 0–99)
TRIGLYCERIDES: 166 mg/dL — AB (ref 0–149)
VLDL Cholesterol Cal: 33 mg/dL (ref 5–40)

## 2016-06-05 LAB — HEMOGLOBIN A1C
Est. average glucose Bld gHb Est-mCnc: 189 mg/dL
HEMOGLOBIN A1C: 8.2 % — AB (ref 4.8–5.6)

## 2016-06-07 ENCOUNTER — Other Ambulatory Visit: Payer: Self-pay | Admitting: Internal Medicine

## 2016-06-07 MED ORDER — EMPAGLIFLOZIN 25 MG PO TABS: 25.0000 mg | ORAL_TABLET | Freq: Every day | ORAL | 5 refills | Status: DC

## 2016-06-07 NOTE — Progress Notes (Signed)
Done

## 2016-06-19 ENCOUNTER — Other Ambulatory Visit: Payer: Self-pay | Admitting: Internal Medicine

## 2016-07-19 ENCOUNTER — Other Ambulatory Visit: Payer: Self-pay | Admitting: Internal Medicine

## 2016-08-09 ENCOUNTER — Ambulatory Visit (INDEPENDENT_AMBULATORY_CARE_PROVIDER_SITE_OTHER): Payer: BLUE CROSS/BLUE SHIELD | Admitting: Physician Assistant

## 2016-08-09 ENCOUNTER — Encounter: Payer: Self-pay | Admitting: Physician Assistant

## 2016-08-09 ENCOUNTER — Ambulatory Visit: Payer: Self-pay | Admitting: Physician Assistant

## 2016-08-09 VITALS — BP 124/62 | HR 62 | Temp 98.7°F | Ht 71.0 in | Wt 265.0 lb

## 2016-08-09 DIAGNOSIS — J029 Acute pharyngitis, unspecified: Secondary | ICD-10-CM | POA: Diagnosis not present

## 2016-08-09 MED ORDER — AMOXICILLIN 875 MG PO TABS: 875.0000 mg | ORAL_TABLET | Freq: Two times a day (BID) | ORAL | 0 refills | Status: AC

## 2016-08-09 NOTE — Progress Notes (Signed)
Subjective:    Patient ID: Wyatt Mata, male    DOB: 12/15/1955, 61 y.o.   MRN: [id number]  Wyatt Mata is a 61 y.o. male presenting on 08/09/2016 for Sore Throat (Started 10 days ago- Coughing: worse at night - yellow production. No fever. No congestion. )   HPI   [name] is a 61 y/o male former smoking presenting today for sore throat ongoing for 10 days and worsening. He denies sick contacts. He has some nasal congestion and yellow sputum production. No fevers, some coughing. No trouble breathing, N/V. Minimal sinus congestion, no ear pain. He sleeps recently with the air conditioner on as well as a fan in his bedroom. Also snores. Has tried cough drops which have provided minimal relief.   Social History  Substance Use Topics  . Smoking status: Former Games developer  . Smokeless tobacco: Never Used  . Alcohol use No    Review of Systems Per HPI unless specifically indicated above     Objective:    BP 124/62 (BP Location: Right Arm, Patient Position: Sitting, Cuff Size: Large)   Pulse 62   Temp 98.7 F (37.1 C)   Ht 5\' 11"  (1.803 m)   Wt 265 lb (120.2 kg)   SpO2 95%   BMI 36.96 kg/m   Wt Readings from Last 3 Encounters:  08/09/16 265 lb (120.2 kg)  06/04/16 272 lb 3.2 oz (123.5 kg)  10/10/15 268 lb 12.8 oz (121.9 kg)    Physical Exam  Constitutional: He is oriented to person, place, and time. He appears well-developed and well-nourished.  HENT:  Right Ear: Tympanic membrane and external ear normal.  Left Ear: Tympanic membrane and external ear normal.  Mouth/Throat: Oropharynx is clear and moist. No oropharyngeal exudate.  Neck: Neck supple.  Cardiovascular: Normal rate and regular rhythm.   Pulmonary/Chest: Effort normal and breath sounds normal.  Lymphadenopathy:    He has no cervical adenopathy.  Neurological: He is alert and oriented to person, place, and time.  Skin: Skin is warm and dry.  Psychiatric: He has a normal mood and affect. His behavior  is normal.   Results for orders placed or performed in visit on 06/04/16  CBC with Differential/Platelet  Result Value Ref Range   WBC 4.5 3.4 - 10.8 x10E3/uL   RBC 5.13 4.14 - 5.80 x10E6/uL   Hemoglobin 15.5 13.0 - 17.7 g/dL   Hematocrit 40.9 81.1 - 51.0 %   MCV 88 79 - 97 fL   MCH 30.2 26.6 - 33.0 pg   MCHC 34.5 31.5 - 35.7 g/dL   RDW 91.4 78.2 - 95.6 %   Platelets 130 (L) 150 - 379 x10E3/uL   Neutrophils 54 Not Estab. %   Lymphs 28 Not Estab. %   Monocytes 14 Not Estab. %   Eos 4 Not Estab. %   Basos 0 Not Estab. %   Neutrophils Absolute 2.4 1.4 - 7.0 x10E3/uL   Lymphocytes Absolute 1.2 0.7 - 3.1 x10E3/uL   Monocytes Absolute 0.6 0.1 - 0.9 x10E3/uL   EOS (ABSOLUTE) 0.2 0.0 - 0.4 x10E3/uL   Basophils Absolute 0.0 0.0 - 0.2 x10E3/uL   Immature Granulocytes 0 Not Estab. %   Immature Grans (Abs) 0.0 0.0 - 0.1 x10E3/uL  Comprehensive metabolic panel  Result Value Ref Range   Glucose 134 (H) 65 - 99 mg/dL   BUN 22 8 - 27 mg/dL   Creatinine, Ser 2.13 0.76 - 1.27 mg/dL   GFR calc non Af Denyse Dago  84 >59 mL/min/1.73   GFR calc Af Amer 98 >59 mL/min/1.73   BUN/Creatinine Ratio 23 10 - 24   Sodium 139 134 - 144 mmol/L   Potassium 4.4 3.5 - 5.2 mmol/L   Chloride 102 96 - 106 mmol/L   CO2 21 18 - 29 mmol/L   Calcium 8.9 8.6 - 10.2 mg/dL   Total Protein 6.4 6.0 - 8.5 g/dL   Albumin 4.0 3.6 - 4.8 g/dL   Globulin, Total 2.4 1.5 - 4.5 g/dL   Albumin/Globulin Ratio 1.7 1.2 - 2.2   Bilirubin Total 0.5 0.0 - 1.2 mg/dL   Alkaline Phosphatase 51 39 - 117 IU/L   AST 27 0 - 40 IU/L   ALT 42 0 - 44 IU/L  Hemoglobin A1c  Result Value Ref Range   Hgb A1c MFr Bld 8.2 (H) 4.8 - 5.6 %   Est. average glucose Bld gHb Est-mCnc 189 mg/dL  Lipid panel  Result Value Ref Range   Cholesterol, Total 185 100 - 199 mg/dL   Triglycerides 324166 (H) 0 - 149 mg/dL   HDL 29 (L) >40>39 mg/dL   VLDL Cholesterol Cal 33 5 - 40 mg/dL   LDL Calculated 102123 (H) 0 - 99 mg/dL   Chol/HDL Ratio 6.4 (H) 0.0 - 5.0 ratio  units      Assessment & Plan:   1. Pharyngitis, unspecified etiology  Exam benign today. Have gone over other etiologies to include viruses, allergies, dry air from his air conditioner/fan. Patient does not want to be on chronic allergy medications or even try them. Rapid strep negative,  antibiotics at patient request to cover for other possible bacterial causes. Can also try humidifier in room at night. OTC cough medication, no decongestants.  - POCT rapid strep A - amoxicillin (AMOXIL) 875 MG tablet; Take 1 tablet (875 mg total) by mouth 2 (two) times daily.  Dispense: 20 tablet; Refill: 0  Return if symptoms worsen or fail to improve.   Osvaldo AngstAdriana Pollak, PA-C [facility]  [facility] 08/09/2016, 2:36 PM

## 2016-08-09 NOTE — Patient Instructions (Addendum)
Can use Delsym over the counter, NO decongestants  Pharyngitis Pharyngitis is redness, pain, and swelling (inflammation) of your pharynx. What are the causes? Pharyngitis is usually caused by infection. Most of the time, these infections are from viruses (viral) and are part of a cold. However, sometimes pharyngitis is caused by bacteria (bacterial). Pharyngitis can also be caused by allergies. Viral pharyngitis may be spread from person to person by coughing, sneezing, and personal items or utensils (cups, forks, spoons, toothbrushes). Bacterial pharyngitis may be spread from person to person by more intimate contact, such as kissing. What are the signs or symptoms? Symptoms of pharyngitis include:  Sore throat.  Tiredness (fatigue).  Low-grade fever.  Headache.  Joint pain and muscle aches.  Skin rashes.  Swollen lymph nodes.  Plaque-like film on throat or tonsils (often seen with bacterial pharyngitis). How is this diagnosed? Your health care provider will ask you questions about your illness and your symptoms. Your medical history, along with a physical exam, is often all that is needed to diagnose pharyngitis. Sometimes, a rapid strep test is done. Other lab tests may also be done, depending on the suspected cause. How is this treated? Viral pharyngitis will usually get better in 3-4 days without the use of medicine. Bacterial pharyngitis is treated with medicines that kill germs (antibiotics). Follow these instructions at home:  Drink enough water and fluids to keep your urine clear or pale yellow.  Only take over-the-counter or prescription medicines as directed by your health care provider:  If you are prescribed antibiotics, make sure you finish them even if you start to feel better.  Do not take aspirin.  Get lots of rest.  Gargle with 8 oz of salt water ( tsp of salt per 1 qt of water) as often as every 1-2 hours to soothe your throat.  Throat lozenges (if you  are not at risk for choking) or sprays may be used to soothe your throat. Contact a health care provider if:  You have large, tender lumps in your neck.  You have a rash.  You cough up green, yellow-brown, or bloody spit. Get help right away if:  Your neck becomes stiff.  You drool or are unable to swallow liquids.  You vomit or are unable to keep medicines or liquids down.  You have severe pain that does not go away with the use of recommended medicines.  You have trouble breathing (not caused by a stuffy nose). This information is not intended to replace advice given to you by your health care provider. Make sure you discuss any questions you have with your health care provider. Document Released: 03/08/2005 Document Revised: 08/14/2015 Document Reviewed: 11/13/2012 Elsevier Interactive Patient Education  2017 [employer]

## 2016-08-10 LAB — POCT RAPID STREP A (OFFICE): Rapid Strep A Screen: NEGATIVE

## 2016-08-20 ENCOUNTER — Other Ambulatory Visit: Payer: Self-pay | Admitting: Internal Medicine

## 2016-08-20 DIAGNOSIS — E1165 Type 2 diabetes mellitus with hyperglycemia: Principal | ICD-10-CM

## 2016-08-20 DIAGNOSIS — IMO0001 Reserved for inherently not codable concepts without codable children: Secondary | ICD-10-CM

## 2016-08-20 DIAGNOSIS — I10 Essential (primary) hypertension: Secondary | ICD-10-CM

## 2016-08-21 ENCOUNTER — Other Ambulatory Visit: Payer: Self-pay | Admitting: Internal Medicine

## 2016-08-21 DIAGNOSIS — E785 Hyperlipidemia, unspecified: Secondary | ICD-10-CM

## 2016-09-19 ENCOUNTER — Other Ambulatory Visit: Payer: Self-pay | Admitting: Internal Medicine

## 2016-09-19 DIAGNOSIS — E1165 Type 2 diabetes mellitus with hyperglycemia: Principal | ICD-10-CM

## 2016-09-19 DIAGNOSIS — IMO0001 Reserved for inherently not codable concepts without codable children: Secondary | ICD-10-CM

## 2016-09-25 ENCOUNTER — Other Ambulatory Visit: Payer: Self-pay | Admitting: Internal Medicine

## 2016-09-28 ENCOUNTER — Encounter: Payer: Self-pay | Admitting: Internal Medicine

## 2016-09-28 ENCOUNTER — Ambulatory Visit (INDEPENDENT_AMBULATORY_CARE_PROVIDER_SITE_OTHER): Payer: 59 | Admitting: Internal Medicine

## 2016-09-28 VITALS — BP 128/68 | HR 74 | Ht 71.0 in | Wt 264.0 lb

## 2016-09-28 DIAGNOSIS — Z0000 Encounter for general adult medical examination without abnormal findings: Secondary | ICD-10-CM | POA: Diagnosis not present

## 2016-09-28 DIAGNOSIS — I2510 Atherosclerotic heart disease of native coronary artery without angina pectoris: Secondary | ICD-10-CM

## 2016-09-28 DIAGNOSIS — E785 Hyperlipidemia, unspecified: Secondary | ICD-10-CM

## 2016-09-28 DIAGNOSIS — Z125 Encounter for screening for malignant neoplasm of prostate: Secondary | ICD-10-CM | POA: Diagnosis not present

## 2016-09-28 DIAGNOSIS — N50819 Testicular pain, unspecified: Secondary | ICD-10-CM

## 2016-09-28 DIAGNOSIS — G4733 Obstructive sleep apnea (adult) (pediatric): Secondary | ICD-10-CM | POA: Diagnosis not present

## 2016-09-28 DIAGNOSIS — R310 Gross hematuria: Secondary | ICD-10-CM

## 2016-09-28 DIAGNOSIS — E1165 Type 2 diabetes mellitus with hyperglycemia: Secondary | ICD-10-CM

## 2016-09-28 DIAGNOSIS — I10 Essential (primary) hypertension: Secondary | ICD-10-CM | POA: Diagnosis not present

## 2016-09-28 DIAGNOSIS — IMO0001 Reserved for inherently not codable concepts without codable children: Secondary | ICD-10-CM

## 2016-09-28 LAB — POCT URINALYSIS DIPSTICK
BILIRUBIN UA: NEGATIVE
GLUCOSE UA: NEGATIVE
Ketones, UA: NEGATIVE
Leukocytes, UA: NEGATIVE
NITRITE UA: NEGATIVE
Protein, UA: NEGATIVE
Spec Grav, UA: 1.01 (ref 1.010–1.025)
Urobilinogen, UA: 0.2 E.U./dL
pH, UA: 6.5 (ref 5.0–8.0)

## 2016-09-28 MED ORDER — SITAGLIP PHOS-METFORMIN HCL ER 100-1000 MG PO TB24: 1.0000 | ORAL_TABLET | Freq: Every day | ORAL | 12 refills | Status: DC

## 2016-09-28 MED ORDER — GABAPENTIN 300 MG PO CAPS: 300.0000 mg | ORAL_CAPSULE | Freq: Two times a day (BID) | ORAL | 5 refills | Status: DC

## 2016-09-28 MED ORDER — FENOFIBRIC ACID 135 MG PO CPDR: 1.0000 | DELAYED_RELEASE_CAPSULE | Freq: Every day | ORAL | 12 refills | Status: DC

## 2016-09-28 MED ORDER — NIACIN ER (ANTIHYPERLIPIDEMIC) 1000 MG PO TBCR: 1000.0000 mg | EXTENDED_RELEASE_TABLET | Freq: Every day | ORAL | 12 refills | Status: DC

## 2016-09-28 NOTE — Patient Instructions (Signed)
Schedule DM eye exam annually

## 2016-09-28 NOTE — Progress Notes (Signed)
Date:  09/28/2016   Name:  Wyatt Mata   DOB:  1955/04/09   MRN:  [id number]   Chief Complaint: Annual Exam (Would like to up gabapentin from 30 mg to 60 mg ); Diabetes (been out of medicine for 10 days. ); Hypertension; and Hyperlipidemia Wyatt Mata is a 61 y.o. male who presents today for his Complete Annual Exam. He feels fairly well. He reports exercising with work but no scheduled exercise. He reports he is sleeping well.   Diabetes  He presents for his follow-up diabetic visit. He has type 2 diabetes mellitus. His disease course has been stable. Pertinent negatives for hypoglycemia include no dizziness or headaches. Pertinent negatives for diabetes include no chest pain and no fatigue. Current diabetic treatments: jardiance, janumet. He is compliant with treatment most of the time. He monitors blood glucose at home 1-2 x per week. There is no change in his home blood glucose trend. An ACE inhibitor/angiotensin II receptor blocker is being taken.  Hypertension  This is a chronic problem. The problem is controlled. Pertinent negatives include no chest pain, headaches, palpitations or shortness of breath. Past treatments include calcium channel blockers, beta blockers and ACE inhibitors. Hypertensive end-organ damage includes CAD/MI.  Hyperlipidemia  This is a chronic problem. Exacerbating diseases include diabetes. Pertinent negatives include no chest pain, myalgias or shortness of breath. Current antihyperlipidemic treatment includes statins, fibric acid derivatives and nicotinic acid. The current treatment provides significant improvement of lipids.  CAD - had full evaluation last fall with excellent report.  No medication changes were made. Microscopic/gross hematuria - noted about 2 years ago.  Pt cancelled [place] due to cost.  He has not noticed any further blood in his urine. Testicular pain - chronic pain starting in LLQ radiating into left testicle.  Evaluated by Urology - no  cause found,  Improved on gabapentin.  Currently on 300 mg per day; would like to increase the dose.  Lab Results  Component Value Date   HGBA1C 8.2 (H) 06/04/2016   Lab Results  Component Value Date   CHOL 185 06/04/2016   HDL 29 (L) 06/04/2016   LDLCALC 123 (H) 06/04/2016   TRIG 166 (H) 06/04/2016   CHOLHDL 6.4 (H) 06/04/2016     Review of Systems  Constitutional: Negative for appetite change, chills, diaphoresis, fatigue and unexpected weight change.  HENT: Negative for hearing loss, tinnitus, trouble swallowing and voice change.   Eyes: Negative for visual disturbance.  Respiratory: Negative for choking, shortness of breath and wheezing.   Cardiovascular: Negative for chest pain, palpitations and leg swelling.  Gastrointestinal: Negative for abdominal pain, blood in stool, constipation and diarrhea.  Genitourinary: Positive for testicular pain. Negative for difficulty urinating, dysuria, frequency and hematuria.  Musculoskeletal: Negative for arthralgias, back pain and myalgias.  Skin: Negative for color change and rash.  Neurological: Negative for dizziness, syncope and headaches.  Hematological: Negative for adenopathy.  Psychiatric/Behavioral: Negative for dysphoric mood and sleep disturbance.    Patient Active Problem List   Diagnosis Date Noted  . Uncontrolled type 2 diabetes mellitus without complication, without long-term current use of insulin (HCC) 10/10/2015  . Low serum testosterone 10/10/2015  . Hematuria, gross 10/15/2014  . Allergic rhinitis 08/05/2014  . CAD in native artery 08/05/2014  . Dyslipidemia 08/05/2014  . Epididymal pain 08/05/2014  . 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 Essential (primary) hypertension 08/05/2014  . Acid reflux 08/05/2014  . History of colon polyps 08/05/2014  .  Obstructive apnea 08/05/2014  . 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 Authorizing Provider  amLODipine (NORVASC) 5 MG tablet TAKE 1 TABLET BY MOUTH EVERY DAY 09/26/16  Yes Reubin Milan, MD  atorvastatin (LIPITOR) 80 MG tablet TAKE 1 TABLET(80 MG) BY MOUTH AT BEDTIME 08/22/16  Yes Reubin Milan, MD  BAYER CONTOUR NEXT TEST test strip USE TO TEST BLOOD GLUCOSE ONCE DAILY 12/10/15  Yes Reubin Milan, MD  Choline Fenofibrate (FENOFIBRIC ACID) 135 MG CPDR TAKE 1 CAPSULE BY MOUTH EVERY DAY 09/20/16  Yes Reubin Milan, MD  clopidogrel (PLAVIX) 75 MG tablet TAKE 1 TABLET BY MOUTH DAILY 07/19/16  Yes Reubin Milan, MD  empagliflozin (JARDIANCE) 25 MG TABS tablet Take 25 mg by mouth daily. 06/07/16  Yes Reubin Milan, MD  gabapentin (NEURONTIN) 300 MG capsule Take 1 capsule (300 mg total) by mouth daily. 06/04/16  Yes Reubin Milan, MD  JANUMET XR [phone] MG TB24 TAKE 1 TABLET BY MOUTH DAILY 09/20/16  Yes Reubin Milan, MD  lisinopril (PRINIVIL,ZESTRIL) 10 MG tablet TAKE 1 TABLET BY MOUTH DAILY 06/19/16  Yes Reubin Milan, MD  metoprolol succinate (TOPROL-XL) 100 MG 24 hr tablet TAKE 1 TABLET BY MOUTH TWICE DAILY 06/19/16  Yes Reubin Milan, MD  MICROLET LANCETS MISC USE EACH DAY 10/30/15  Yes Reubin Milan, MD  niacin (NIASPAN) 1000 MG CR tablet TAKE 1 TABLET BY MOUTH AT BEDTIME 09/20/16  Yes Reubin Milan, MD  sildenafil (VIAGRA) 100 MG tablet Take 0.5-1 tablets (50-100 mg total) by mouth daily as needed for erectile dysfunction. 06/04/16  Yes Reubin Milan, MD    No Known Allergies  Past Surgical History:  Procedure Laterality Date  . COLONOSCOPY  08/16/2014   tubular adenoma  . CORONARY ANGIOPLASTY WITH STENT PLACEMENT  2013  . CORONARY ANGIOPLASTY WITH STENT PLACEMENT  01/2015   2 stents  - post circ and OM2  . CORONARY ARTERY BYPASS GRAFT  2004    Social History  Substance Use Topics  . Smoking status: Former Games developer  . Smokeless tobacco: Never Used  . Alcohol use No     Medication list has been reviewed and updated.   Physical Exam  Constitutional: He is oriented to person, place, and time. He appears well-developed and well-nourished.  HENT:  Head: Normocephalic.  Right Ear: Tympanic membrane, external ear and ear canal normal.  Left Ear: Tympanic membrane, external ear and ear canal normal.  Nose: Nose normal.  Mouth/Throat: Uvula is midline and oropharynx is clear and moist.  Eyes: Conjunctivae and EOM are normal. Pupils are equal, round, and reactive to light.  Neck: Normal range of motion. Neck supple. Carotid bruit is not present. No thyromegaly present.  Cardiovascular: Normal rate, regular rhythm, normal heart sounds and intact distal pulses.   Pulmonary/Chest: Effort normal and breath sounds normal. He has no wheezes. Right breast exhibits no mass. Left breast exhibits no mass.  Abdominal: Soft. Normal appearance and bowel sounds are normal. There is no hepatosplenomegaly. There is tenderness in the left lower quadrant.  Musculoskeletal: Normal range of motion. He exhibits no edema or tenderness.       Right knee: Normal.       Left knee: Normal.       Feet:  Lymphadenopathy:    He has no cervical adenopathy.  Neurological: He is alert and  oriented to person, place, and time. He has normal reflexes.  Skin: Skin is warm, dry and intact.  Psychiatric: He has a normal mood and affect. His speech is normal and behavior is normal. Judgment and thought content normal.  Nursing note and vitals reviewed.   BP 128/68   Pulse 74   Ht 5\' 11"  (1.803 m)   Wt 264 lb (119.7 kg)   SpO2 96%   BMI 36.82 kg/m   Assessment and Plan: 1. Annual physical exam Continue to work on weight with Atkins-type diet - POCT urinalysis dipstick  2. Prostate cancer screening DRE deferred - PSA  3. CAD in native artery Stable without angina  4. Essential (primary) hypertension controlled - CBC with Differential/Platelet - Comprehensive metabolic  panel  5. Uncontrolled type 2 diabetes mellitus without complication, without long-term current use of insulin (HCC) Continue current therapy Encourage more regular FSBS at home - Hemoglobin A1c - Microalbumin / creatinine urine ratio - SitaGLIPtin-MetFORMIN HCl (JANUMET XR) [phone] MG TB24; Take 1 tablet by mouth daily.  Dispense: 30 tablet; Refill: 12  6. Dyslipidemia On triple therapy - Lipid panel - niacin (NIASPAN) 1000 MG CR tablet; Take 1 tablet (1,000 mg total) by mouth at bedtime.  Dispense: 30 tablet; Refill: 12 - Choline Fenofibrate (FENOFIBRIC ACID) 135 MG CPDR; Take 1 tablet by mouth daily.  Dispense: 30 capsule; Refill: 12  7. Obstructive apnea unchanged  8. Epididymal pain Increase gabapentin to 600 mg per day  9. Hematuria, gross persistent - US Renal; Future   Meds ordered this encounter  Medications  . gabapentin (NEURONTIN) 300 MG capsule    Sig: Take 1 capsule (300 mg total) by mouth 2 (two) times daily.    Dispense:  60 capsule    Refill:  5  . SitaGLIPtin-MetFORMIN HCl (JANUMET XR) [phone] MG TB24    Sig: Take 1 tablet by mouth daily.    Dispense:  30 tablet    Refill:  12  . niacin (NIASPAN) 1000 MG CR tablet    Sig: Take 1 tablet (1,000 mg total) by mouth at bedtime.    Dispense:  30 tablet    Refill:  12  . Choline Fenofibrate (FENOFIBRIC ACID) 135 MG CPDR    Sig: Take 1 tablet by mouth daily.    Dispense:  30 capsule    Refill:  12    Bari EdwardLaura Dessa Ledee, MD [facility]  09/28/2016

## 2016-09-29 ENCOUNTER — Encounter: Payer: Self-pay | Admitting: Internal Medicine

## 2016-09-29 LAB — COMPREHENSIVE METABOLIC PANEL
A/G RATIO: 1.9 (ref 1.2–2.2)
ALK PHOS: 46 IU/L (ref 39–117)
ALT: 60 IU/L — AB (ref 0–44)
AST: 34 IU/L (ref 0–40)
Albumin: 4.6 g/dL (ref 3.6–4.8)
BILIRUBIN TOTAL: 0.4 mg/dL (ref 0.0–1.2)
BUN / CREAT RATIO: 16 (ref 10–24)
BUN: 17 mg/dL (ref 8–27)
CHLORIDE: 101 mmol/L (ref 96–106)
CO2: 23 mmol/L (ref 20–29)
Calcium: 10 mg/dL (ref 8.6–10.2)
Creatinine, Ser: 1.05 mg/dL (ref 0.76–1.27)
GFR calc non Af Amer: 76 mL/min/{1.73_m2} (ref >59)
GFR, EST AFRICAN AMERICAN: 88 mL/min/{1.73_m2} (ref >59)
Globulin, Total: 2.4 g/dL (ref 1.5–4.5)
Glucose: 99 mg/dL (ref 65–99)
POTASSIUM: 4.8 mmol/L (ref 3.5–5.2)
Sodium: 141 mmol/L (ref 134–144)
TOTAL PROTEIN: 7 g/dL (ref 6.0–8.5)

## 2016-09-29 LAB — LIPID PANEL
Chol/HDL Ratio: 6.9 ratio — ABNORMAL HIGH (ref 0.0–5.0)
Cholesterol, Total: 235 mg/dL — ABNORMAL HIGH (ref 100–199)
HDL: 34 mg/dL — AB (ref >39)
LDL Calculated: 156 mg/dL — ABNORMAL HIGH (ref 0–99)
Triglycerides: 224 mg/dL — ABNORMAL HIGH (ref 0–149)
VLDL Cholesterol Cal: 45 mg/dL — ABNORMAL HIGH (ref 5–40)

## 2016-09-29 LAB — CBC WITH DIFFERENTIAL/PLATELET
BASOS: 1 %
Basophils Absolute: 0 10*3/uL (ref 0.0–0.2)
EOS (ABSOLUTE): 0.1 10*3/uL (ref 0.0–0.4)
Eos: 3 %
Hematocrit: 46.7 % (ref 37.5–51.0)
Hemoglobin: 16.2 g/dL (ref 13.0–17.7)
IMMATURE GRANS (ABS): 0 10*3/uL (ref 0.0–0.1)
Immature Granulocytes: 0 %
LYMPHS ABS: 1.4 10*3/uL (ref 0.7–3.1)
Lymphs: 33 %
MCH: 29.9 pg (ref 26.6–33.0)
MCHC: 34.7 g/dL (ref 31.5–35.7)
MCV: 86 fL (ref 79–97)
MONOS ABS: 0.5 10*3/uL (ref 0.1–0.9)
Monocytes: 13 %
NEUTROS ABS: 2 10*3/uL (ref 1.4–7.0)
Neutrophils: 50 %
Platelets: 140 10*3/uL — ABNORMAL LOW (ref 150–379)
RBC: 5.42 x10E6/uL (ref 4.14–5.80)
RDW: 15.2 % (ref 12.3–15.4)
WBC: 4.1 10*3/uL (ref 3.4–10.8)

## 2016-09-29 LAB — HEMOGLOBIN A1C
ESTIMATED AVERAGE GLUCOSE: 146 mg/dL
HEMOGLOBIN A1C: 6.7 % — AB (ref 4.8–5.6)

## 2016-09-29 LAB — PSA: Prostate Specific Ag, Serum: 1.6 ng/mL (ref 0.0–4.0)

## 2016-09-30 ENCOUNTER — Encounter: Payer: Self-pay | Admitting: Internal Medicine

## 2016-10-04 ENCOUNTER — Ambulatory Visit: Payer: Self-pay

## 2016-10-11 ENCOUNTER — Ambulatory Visit
Admission: RE | Admit: 2016-10-11 | Discharge: 2016-10-11 | Disposition: A | Discharge status: Home / Self Care | Payer: 59 | Source: Ambulatory Visit | Attending: Internal Medicine | Admitting: Internal Medicine

## 2016-10-11 DIAGNOSIS — N281 Cyst of kidney, acquired: Secondary | ICD-10-CM | POA: Insufficient documentation

## 2016-10-11 DIAGNOSIS — R310 Gross hematuria: Secondary | ICD-10-CM

## 2016-10-13 ENCOUNTER — Other Ambulatory Visit: Payer: Self-pay | Admitting: Internal Medicine

## 2016-10-13 DIAGNOSIS — R310 Gross hematuria: Secondary | ICD-10-CM

## 2016-10-22 ENCOUNTER — Telehealth: Payer: Self-pay

## 2016-10-22 NOTE — Telephone Encounter (Signed)
Patient rcvd a call about appt he has Thursday. He needs to know who the doctor is and what specialist as well as the address and phone number. I can not open this up due to my access issues.

## 2016-10-25 DIAGNOSIS — H52209 Unspecified astigmatism, unspecified eye: Secondary | ICD-10-CM | POA: Diagnosis not present

## 2016-10-26 ENCOUNTER — Telehealth: Payer: Self-pay

## 2016-10-26 ENCOUNTER — Other Ambulatory Visit: Payer: Self-pay | Admitting: Internal Medicine

## 2016-10-26 DIAGNOSIS — E785 Hyperlipidemia, unspecified: Secondary | ICD-10-CM

## 2016-10-26 DIAGNOSIS — IMO0001 Reserved for inherently not codable concepts without codable children: Secondary | ICD-10-CM

## 2016-10-26 DIAGNOSIS — E1165 Type 2 diabetes mellitus with hyperglycemia: Principal | ICD-10-CM

## 2016-10-26 NOTE — Telephone Encounter (Signed)
Patient called stating he missed appt for referral that was sent. Asked if we could call back with Name of Place, and there telephone number because he missed his appointment. I called and spoke to pt- informed him the place was [facility] in [place]- there number which is [phone]- and I explained the reason he was sent there is to be evaluated for hematuria. He verbalized understanding and stated he would call to try and reschedule.

## 2016-10-28 DIAGNOSIS — R1084 Generalized abdominal pain: Secondary | ICD-10-CM | POA: Diagnosis not present

## 2016-10-28 DIAGNOSIS — R3129 Other microscopic hematuria: Secondary | ICD-10-CM | POA: Diagnosis not present

## 2016-10-28 DIAGNOSIS — N453 Epididymo-orchitis: Secondary | ICD-10-CM | POA: Diagnosis not present

## 2016-10-28 DIAGNOSIS — N509 Disorder of male genital organs, unspecified: Secondary | ICD-10-CM | POA: Diagnosis not present

## 2016-11-19 ENCOUNTER — Encounter: Payer: Self-pay | Admitting: Internal Medicine

## 2016-12-01 ENCOUNTER — Telehealth: Payer: Self-pay

## 2016-12-01 ENCOUNTER — Other Ambulatory Visit: Payer: Self-pay | Admitting: Internal Medicine

## 2016-12-01 ENCOUNTER — Other Ambulatory Visit: Payer: Self-pay

## 2016-12-01 MED ORDER — SILDENAFIL CITRATE 100 MG PO TABS: 50.0000 mg | ORAL_TABLET | Freq: Every day | ORAL | 5 refills | Status: DC | PRN

## 2016-12-01 NOTE — Telephone Encounter (Signed)
Patient called left Vm- requesting refill on Viagra 100 mg send to [facility] [address] in [place]. Please Advise.

## 2016-12-01 NOTE — Telephone Encounter (Signed)
He has refills at [facility] - one year worth given in march.

## 2016-12-24 DIAGNOSIS — R3129 Other microscopic hematuria: Secondary | ICD-10-CM | POA: Diagnosis not present

## 2016-12-27 ENCOUNTER — Other Ambulatory Visit: Payer: Self-pay | Admitting: Internal Medicine

## 2016-12-31 DIAGNOSIS — I714 Abdominal aortic aneurysm, without rupture: Secondary | ICD-10-CM | POA: Diagnosis not present

## 2016-12-31 DIAGNOSIS — I700 Atherosclerosis of aorta: Secondary | ICD-10-CM | POA: Diagnosis not present

## 2016-12-31 DIAGNOSIS — I701 Atherosclerosis of renal artery: Secondary | ICD-10-CM | POA: Diagnosis not present

## 2017-01-04 ENCOUNTER — Telehealth: Payer: Self-pay

## 2017-01-04 NOTE — Telephone Encounter (Signed)
Patient called and left VM stating he was told by another doctor his cardiologist that his cholesterol is still too high after taking medication - Atorvastatin 80 mg. He recommended the Repatha injection for the patient. Pt called for Dr Jaclynn Guarneri opinion about medication and to see if we can up his medication.  Spoke with Dr Judithann Graves and called patient back. Informed him she stated that he needs to be reminded he had a bipass surgery in his 74s. And he has had several stents placed since that surgery. She highly recommends him starting the repatha injections because his disease is progressing. Pt stated he doesn't like needles and didn't want to do so. I explained to him that its Dr Karn Cassis opinion that he gets this medication.   He verbalized understanding but seemed upset and hung up the phone.

## 2017-01-06 DIAGNOSIS — E782 Mixed hyperlipidemia: Secondary | ICD-10-CM | POA: Diagnosis not present

## 2017-01-06 DIAGNOSIS — I25810 Atherosclerosis of coronary artery bypass graft(s) without angina pectoris: Secondary | ICD-10-CM | POA: Diagnosis not present

## 2017-01-17 DIAGNOSIS — Z23 Encounter for immunization: Secondary | ICD-10-CM | POA: Diagnosis not present

## 2017-01-30 ENCOUNTER — Other Ambulatory Visit: Payer: Self-pay | Admitting: Internal Medicine

## 2017-02-04 ENCOUNTER — Encounter: Payer: Self-pay | Admitting: Internal Medicine

## 2017-02-04 ENCOUNTER — Ambulatory Visit: Payer: 59 | Admitting: Internal Medicine

## 2017-02-04 ENCOUNTER — Other Ambulatory Visit: Payer: Self-pay | Admitting: Internal Medicine

## 2017-02-04 VITALS — BP 136/70 | HR 75 | Ht 71.0 in | Wt 266.4 lb

## 2017-02-04 DIAGNOSIS — E1165 Type 2 diabetes mellitus with hyperglycemia: Secondary | ICD-10-CM | POA: Diagnosis not present

## 2017-02-04 DIAGNOSIS — M62830 Muscle spasm of back: Secondary | ICD-10-CM | POA: Diagnosis not present

## 2017-02-04 DIAGNOSIS — I10 Essential (primary) hypertension: Secondary | ICD-10-CM

## 2017-02-04 DIAGNOSIS — IMO0001 Reserved for inherently not codable concepts without codable children: Secondary | ICD-10-CM

## 2017-02-04 DIAGNOSIS — I2510 Atherosclerotic heart disease of native coronary artery without angina pectoris: Secondary | ICD-10-CM | POA: Diagnosis not present

## 2017-02-04 MED ORDER — CYCLOBENZAPRINE HCL 10 MG PO TABS: 10.0000 mg | ORAL_TABLET | Freq: Every evening | ORAL | 0 refills | Status: DC | PRN

## 2017-02-04 NOTE — Progress Notes (Signed)
Date:  02/04/2017   Name:  Wyatt ProseMichael D Clouse   DOB:  10-22-1955   MRN:  [id number]   Chief Complaint: Diabetes and Hyperlipidemia Diabetes  He presents for his follow-up diabetic visit. He has type 2 diabetes mellitus. His disease course has been stable. Pertinent negatives for hypoglycemia include no headaches or tremors. Pertinent negatives for diabetes include no chest pain, no fatigue, no polydipsia and no polyuria. Symptoms are stable. Current diabetic treatment includes oral agent (triple therapy). He is compliant with treatment all of the time. His weight is stable.  Hyperlipidemia  This is a chronic problem. The problem is uncontrolled. Associated symptoms include myalgias. Pertinent negatives include no chest pain or shortness of breath. Current antihyperlipidemic treatment includes statins (considering Repatha).  Muscle spasm - back pain on right side, moves to right side.  Also has similar sx in his neck.  Lab Results  Component Value Date   HGBA1C 6.7 (H) 09/28/2016   Lab Results  Component Value Date   CHOL 235 (H) 09/28/2016   HDL 34 (L) 09/28/2016   LDLCALC 156 (H) 09/28/2016   TRIG 224 (H) 09/28/2016   CHOLHDL 6.9 (H) 09/28/2016   Wt Readings from Last 3 Encounters:  02/04/17 266 lb 6.4 oz (120.8 kg)  09/28/16 264 lb (119.7 kg)  08/09/16 265 lb (120.2 kg)      Review of Systems  Constitutional: Negative for appetite change, fatigue and unexpected weight change.  Eyes: Negative for visual disturbance.  Respiratory: Negative for cough, shortness of breath and wheezing.   Cardiovascular: Negative for chest pain, palpitations and leg swelling.  Gastrointestinal: Negative for abdominal pain and blood in stool.  Endocrine: Negative for polydipsia and polyuria.  Genitourinary: Negative for dysuria and hematuria.  Musculoskeletal: Positive for myalgias.  Skin: Negative for color change and rash.  Neurological: Negative for tremors, numbness and headaches.    Psychiatric/Behavioral: Negative for dysphoric mood.    Patient Active Problem List   Diagnosis Date Noted  . Uncontrolled type 2 diabetes mellitus without complication, without long-term current use of insulin (HCC) 10/10/2015  . Low serum testosterone 10/10/2015  . Hematuria, gross 10/15/2014  . Allergic rhinitis 08/05/2014  . CAD in native artery 08/05/2014  . Dyslipidemia 08/05/2014  . Epididymal pain 08/05/2014  . 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 Essential (primary) hypertension 08/05/2014  . Acid reflux 08/05/2014  . History of colon polyps 08/05/2014  . Obstructive apnea 08/05/2014  . 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 Authorizing Provider  amLODipine (NORVASC) 5 MG tablet TAKE 1 TABLET BY MOUTH EVERY DAY 09/26/16   Reubin MilanBerglund, Brianna Bennett H, MD  atorvastatin (LIPITOR) 80 MG tablet Take 80 mg daily by mouth. 01/30/17   [provider]  BAYER CONTOUR NEXT TEST test strip USE TO TEST BLOOD GLUCOSE ONCE DAILY 12/10/15   Reubin MilanBerglund, Aadya Kindler H, MD  Choline Fenofibrate (FENOFIBRIC ACID) 135 MG CPDR TAKE 1 CAPSULE BY MOUTH EVERY DAY 10/26/16   Reubin MilanBerglund, Lavita Pontius H, MD  clopidogrel (PLAVIX) 75 MG tablet TAKE 1 TABLET BY MOUTH DAILY 07/19/16   Reubin MilanBerglund, Ivonne Freeburg H, MD  gabapentin (NEURONTIN) 300 MG capsule Take 1 capsule (300 mg total) by mouth 2 (two) times daily. 09/28/16   Reubin MilanBerglund, Talib Headley H, MD  JANUMET XR [phone] MG TB24 TAKE 1 TABLET BY MOUTH DAILY 10/26/16   Reubin MilanBerglund, Lanaya Bennis H, MD  JARDIANCE 25 MG  TABS tablet TAKE 1 TABLET BY MOUTH DAILY 01/30/17   Reubin MilanBerglund, Marthe Dant H, MD  lisinopril (PRINIVIL,ZESTRIL) 10 MG tablet TAKE 1 TABLET BY MOUTH DAILY 01/30/17   Reubin MilanBerglund, Gordie Crumby H, MD  metoprolol succinate (TOPROL-XL) 100 MG 24 hr tablet TAKE 1 TABLET BY MOUTH TWICE DAILY 01/30/17   Reubin MilanBerglund, Eziah Negro H, MD  MICROLET LANCETS MISC USE EACH DAY 10/30/15   Reubin MilanBerglund, Sadhana Frater  H, MD  niacin (NIASPAN) 1000 MG CR tablet TAKE 1 TABLET BY MOUTH AT BEDTIME 10/26/16   Reubin MilanBerglund, Lue Sykora H, MD  sildenafil (VIAGRA) 100 MG tablet Take 0.5-1 tablets (50-100 mg total) by mouth daily as needed for erectile dysfunction. 12/01/16   Reubin MilanBerglund, Jaime Dome H, MD    No Known Allergies  Past Surgical History:  Procedure Laterality Date  . COLONOSCOPY  08/16/2014   tubular adenoma  . CORONARY ANGIOPLASTY WITH STENT PLACEMENT  2013  . CORONARY ANGIOPLASTY WITH STENT PLACEMENT  01/2015   2 stents  - post circ and OM2  . CORONARY ARTERY BYPASS GRAFT  2004    Social History   Tobacco Use  . Smoking status: Former Games developermoker  . Smokeless tobacco: Never Used  Substance Use Topics  . Alcohol use: No    Alcohol/week: 0.0 oz  . Drug use: No     Medication list has been reviewed and updated.  PHQ 2/9 Scores 02/04/2017  PHQ - 2 Score 0    Physical Exam  Constitutional: He is oriented to person, place, and time. He appears well-developed. No distress.  HENT:  Head: Normocephalic and atraumatic.  Neck: Normal range of motion. Carotid bruit is not present.  Cardiovascular: Normal rate, regular rhythm and normal heart sounds.  Pulmonary/Chest: Effort normal and breath sounds normal. No respiratory distress. He has no wheezes. He has no rales.  Musculoskeletal: Normal range of motion. He exhibits no edema or tenderness.       Arms: Neurological: He is alert and oriented to person, place, and time.  Skin: Skin is warm and dry. No rash noted.  Psychiatric: He has a normal mood and affect. His speech is normal and behavior is normal. Thought content normal.  Nursing note and vitals reviewed.   BP 136/70   Pulse 75   Ht 5\' 11"  (1.803 m)   Wt 266 lb 6.4 oz (120.8 kg)   SpO2 96%   BMI 37.16 kg/m   Assessment and Plan: 1. Muscle spasm of back Use heat or ice - cyclobenzaprine (FLEXERIL) 10 MG tablet; Take 1 tablet (10 mg total) at bedtime as needed by mouth for muscle spasms.  Dispense:  30 tablet; Refill: 0  2. Essential (primary) hypertension controlled  3. Uncontrolled type 2 diabetes mellitus without complication, without long-term current use of insulin (HCC) Continue current therapy - Comprehensive metabolic panel - Hemoglobin A1c  4. CAD in native artery Seeing Cardiology Considering Repatha   Meds ordered this encounter  Medications  . cyclobenzaprine (FLEXERIL) 10 MG tablet    Sig: Take 1 tablet (10 mg total) at bedtime as needed by mouth for muscle spasms.    Dispense:  30 tablet    Refill:  0    Partially dictated using Animal nutritionistDragon software. Any errors are unintentional.  Bari EdwardLaura Oswaldo Cueto, MD [facility]  02/04/2017

## 2017-02-04 NOTE — Patient Instructions (Signed)
Call back with the name of your eye doctor so we can request the notes.

## 2017-02-05 LAB — COMPREHENSIVE METABOLIC PANEL
ALBUMIN: 4.7 g/dL (ref 3.6–4.8)
ALK PHOS: 56 IU/L (ref 39–117)
ALT: 43 IU/L (ref 0–44)
AST: 29 IU/L (ref 0–40)
Albumin/Globulin Ratio: 1.8 (ref 1.2–2.2)
BILIRUBIN TOTAL: 0.4 mg/dL (ref 0.0–1.2)
BUN / CREAT RATIO: 25 — AB (ref 10–24)
BUN: 23 mg/dL (ref 8–27)
CHLORIDE: 101 mmol/L (ref 96–106)
CO2: 17 mmol/L — ABNORMAL LOW (ref 20–29)
Calcium: 9.7 mg/dL (ref 8.6–10.2)
Creatinine, Ser: 0.92 mg/dL (ref 0.76–1.27)
GFR calc Af Amer: 103 mL/min/{1.73_m2} (ref >59)
GFR calc non Af Amer: 89 mL/min/{1.73_m2} (ref >59)
GLOBULIN, TOTAL: 2.6 g/dL (ref 1.5–4.5)
GLUCOSE: 113 mg/dL — AB (ref 65–99)
Potassium: 4.6 mmol/L (ref 3.5–5.2)
SODIUM: 139 mmol/L (ref 134–144)
Total Protein: 7.3 g/dL (ref 6.0–8.5)

## 2017-02-05 LAB — HEMOGLOBIN A1C
Est. average glucose Bld gHb Est-mCnc: 146 mg/dL
HEMOGLOBIN A1C: 6.7 % — AB (ref 4.8–5.6)

## 2017-03-10 ENCOUNTER — Other Ambulatory Visit: Payer: Self-pay | Admitting: Internal Medicine

## 2017-04-05 DIAGNOSIS — I10 Essential (primary) hypertension: Secondary | ICD-10-CM | POA: Diagnosis not present

## 2017-04-05 DIAGNOSIS — E7800 Pure hypercholesterolemia, unspecified: Secondary | ICD-10-CM | POA: Diagnosis not present

## 2017-04-05 DIAGNOSIS — I2510 Atherosclerotic heart disease of native coronary artery without angina pectoris: Secondary | ICD-10-CM | POA: Diagnosis not present

## 2017-04-10 ENCOUNTER — Other Ambulatory Visit: Payer: Self-pay | Admitting: Internal Medicine

## 2017-04-15 ENCOUNTER — Telehealth: Payer: Self-pay

## 2017-04-15 NOTE — Telephone Encounter (Signed)
Patient called stating he stepped into a room or elevator where work was being done. There was a room full of dust and since then he is having sinus issues. States his nose is drying up quickly. Wanted advise on what to do. This started 2 weeks ago.  Spoke with Dr Judithann GravesBerglund, she told him to try a OTC saline rinse. This can help keep nose cleaned out. If he feels like its not getting better, or becoming worse come to see Dr Judithann GravesBerglund at a office visit. He verbalized understanding of all of this.   Also, his cardiologist is taking him off of Plavix medication. States his stents are doing there jobs for his heart and wants him to begin weaning off medication. Informed Dr Judithann GravesBerglund.

## 2017-05-13 ENCOUNTER — Other Ambulatory Visit: Payer: Self-pay | Admitting: Internal Medicine

## 2017-05-13 DIAGNOSIS — IMO0001 Reserved for inherently not codable concepts without codable children: Secondary | ICD-10-CM

## 2017-05-13 DIAGNOSIS — E1165 Type 2 diabetes mellitus with hyperglycemia: Principal | ICD-10-CM

## 2017-06-24 ENCOUNTER — Other Ambulatory Visit: Payer: Self-pay | Admitting: Internal Medicine

## 2017-06-24 ENCOUNTER — Ambulatory Visit: Payer: 59 | Admitting: Internal Medicine

## 2017-06-24 ENCOUNTER — Encounter: Payer: Self-pay | Admitting: Internal Medicine

## 2017-06-24 VITALS — BP 98/58 | HR 94 | Ht 71.0 in | Wt 270.0 lb

## 2017-06-24 DIAGNOSIS — E1169 Type 2 diabetes mellitus with other specified complication: Secondary | ICD-10-CM

## 2017-06-24 DIAGNOSIS — I2510 Atherosclerotic heart disease of native coronary artery without angina pectoris: Secondary | ICD-10-CM | POA: Diagnosis not present

## 2017-06-24 DIAGNOSIS — IMO0001 Reserved for inherently not codable concepts without codable children: Secondary | ICD-10-CM

## 2017-06-24 DIAGNOSIS — E785 Hyperlipidemia, unspecified: Secondary | ICD-10-CM | POA: Diagnosis not present

## 2017-06-24 DIAGNOSIS — I10 Essential (primary) hypertension: Secondary | ICD-10-CM | POA: Diagnosis not present

## 2017-06-24 DIAGNOSIS — E1165 Type 2 diabetes mellitus with hyperglycemia: Secondary | ICD-10-CM | POA: Diagnosis not present

## 2017-06-24 DIAGNOSIS — Z86010 Personal history of colonic polyps: Secondary | ICD-10-CM

## 2017-06-24 NOTE — Progress Notes (Signed)
Date:  06/24/2017   Name:  Wyatt ProseMichael D Jeng   DOB:  [birth year]   MRN:  [id number]   Chief Complaint: Hypertension and Diabetes Diabetes  He presents for his follow-up diabetic visit. He has type 2 diabetes mellitus. Hypoglycemia symptoms include dizziness. Pertinent negatives for hypoglycemia include no headaches, nervousness/anxiousness or tremors. Pertinent negatives for diabetes include no chest pain, no fatigue, no polydipsia and no polyuria. Current diabetic treatment includes oral agent (triple therapy) (jardiance and janumet). An ACE inhibitor/angiotensin II receptor blocker is being taken.  Hypertension  This is a chronic problem. The problem is unchanged. The problem is controlled. Pertinent negatives include no chest pain, headaches, palpitations or shortness of breath. Past treatments include calcium channel blockers, beta blockers and ACE inhibitors. Hypertensive end-organ damage includes CAD/MI.  Hyperlipidemia  This is a chronic problem. The problem is uncontrolled. Pertinent negatives include no chest pain or shortness of breath. Current antihyperlipidemic treatment includes statins (Repatha just started by cardiology).  CAD - he was taken off of Plavix at last Cardiology visit because he takes Goodys powders.  Since stopping plavix, and during a period when he did not have his medication for several days, he had some right arm pain radiating into his neck that lasted several days.  Lab Results  Component Value Date   HGBA1C 6.7 (H) 02/04/2017   Lab Results  Component Value Date   CREATININE 0.92 02/04/2017   BUN 23 02/04/2017   NA 139 02/04/2017   K 4.6 02/04/2017   CL 101 02/04/2017   CO2 17 (L) 02/04/2017     Review of Systems  Constitutional: Negative for appetite change, fatigue and unexpected weight change.  Eyes: Negative for visual disturbance.  Respiratory: Negative for cough, shortness of breath and wheezing.   Cardiovascular: Negative for chest pain,  palpitations and leg swelling.  Gastrointestinal: Negative for abdominal pain and blood in stool.  Endocrine: Negative for polydipsia and polyuria.  Genitourinary: Negative for dysuria and hematuria.  Skin: Negative for color change and rash.  Neurological: Positive for dizziness. Negative for tremors, numbness and headaches.  Psychiatric/Behavioral: Negative for dysphoric mood and sleep disturbance. The patient is not nervous/anxious.     Patient Active Problem List   Diagnosis Date Noted  . Uncontrolled type 2 diabetes mellitus without complication, without long-term current use of insulin (HCC) 10/10/2015  . Low serum testosterone 10/10/2015  . Hematuria, gross 10/15/2014  . Allergic rhinitis 08/05/2014  . CAD in native artery 08/05/2014  . Hyperlipidemia associated with type 2 diabetes mellitus (HCC) 08/05/2014  . Epididymal pain 08/05/2014  . 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 Essential (primary) hypertension 08/05/2014  . Acid reflux 08/05/2014  . History of colon polyps 08/05/2014  . Obstructive apnea 08/05/2014  . 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 Authorizing Provider  amLODipine (NORVASC) 5 MG tablet TAKE 1 TABLET BY MOUTH EVERY DAY 09/26/16   Reubin MilanBerglund, Hakop Humbarger H, MD  atorvastatin (LIPITOR) 80 MG tablet Take 80 mg daily by mouth. 01/30/17   [provider]  BAYER CONTOUR NEXT TEST test strip USE TO TEST BLOOD GLUCOSE ONCE DAILY 12/10/15   Reubin MilanBerglund, Sha Burling H, MD  Choline Fenofibrate (FENOFIBRIC ACID) 135 MG CPDR TAKE 1 CAPSULE BY MOUTH EVERY DAY 10/26/16   Reubin MilanBerglund, Chani Ghanem H, MD  clopidogrel (PLAVIX) 75 MG tablet TAKE 1 TABLET BY MOUTH DAILY 07/19/16  Reubin Milan, MD  cyclobenzaprine (FLEXERIL) 10 MG tablet Take 1 tablet (10 mg total) at bedtime as needed by mouth for muscle spasms. 02/04/17   Reubin Milan, MD  gabapentin  (NEURONTIN) 300 MG capsule TAKE 1 CAPSULE(300 MG) BY MOUTH TWICE DAILY 04/10/17   Reubin Milan, MD  JANUMET XR [phone] MG TB24 TAKE 1 TABLET BY MOUTH DAILY 05/13/17   Reubin Milan, MD  JARDIANCE 25 MG TABS tablet TAKE 1 TABLET BY MOUTH DAILY 03/10/17   Reubin Milan, MD  lisinopril (PRINIVIL,ZESTRIL) 10 MG tablet TAKE 1 TABLET BY MOUTH DAILY 03/10/17   Reubin Milan, MD  metoprolol succinate (TOPROL-XL) 100 MG 24 hr tablet TAKE 1 TABLET BY MOUTH TWICE DAILY 03/10/17   Reubin Milan, MD  MICROLET LANCETS MISC USE EACH DAY 10/30/15   Reubin Milan, MD  niacin (NIASPAN) 1000 MG CR tablet TAKE 1 TABLET BY MOUTH AT BEDTIME 10/26/16   Reubin Milan, MD  sildenafil (VIAGRA) 100 MG tablet Take 0.5-1 tablets (50-100 mg total) by mouth daily as needed for erectile dysfunction. 12/01/16   Reubin Milan, MD    No Known Allergies  Past Surgical History:  Procedure Laterality Date  . COLONOSCOPY  08/16/2014   tubular adenoma  . CORONARY ANGIOPLASTY WITH STENT PLACEMENT  2013  . CORONARY ANGIOPLASTY WITH STENT PLACEMENT  01/2015   2 stents  - post circ and OM2  . CORONARY ARTERY BYPASS GRAFT  2004    Social History   Tobacco Use  . Smoking status: Former Games developer  . Smokeless tobacco: Never Used  Substance Use Topics  . Alcohol use: No    Alcohol/week: 0.0 oz  . Drug use: No     Medication list has been reviewed and updated.  PHQ 2/9 Scores 02/04/2017  PHQ - 2 Score 0    Physical Exam  Constitutional: He is oriented to person, place, and time. He appears well-developed. No distress.  HENT:  Head: Normocephalic and atraumatic.  Pulmonary/Chest: Effort normal. No respiratory distress.  Musculoskeletal: Normal range of motion.  Neurological: He is alert and oriented to person, place, and time.  Skin: Skin is warm and dry. No rash noted.  Psychiatric: He has a normal mood and affect. His behavior is normal. Thought content normal.  Nursing note and vitals  reviewed.   BP (!) 98/58   Pulse 94   Ht 5\' 11"  (1.803 m)   Wt 270 lb (122.5 kg)   SpO2 94%   BMI 37.66 kg/m   Assessment and Plan: 1. Uncontrolled type 2 diabetes mellitus without complication, without long-term current use of insulin (HCC) Continue medications - Comprehensive metabolic panel - Hemoglobin A1c - Microalbumin / creatinine urine ratio  2. Essential (primary) hypertension controlled  3. Hyperlipidemia associated with type 2 diabetes mellitus (HCC) Now on lipitor and just started Repatha - Lipid panel  4. History of colon polyps Due for colonoscopy this year  5. CAD in native artery Continue medications If arm/neck pain recurs, go to ED or call cardiology May need to resume Plavix  No orders of the defined types were placed in this encounter.   Partially dictated using Animal nutritionist. Any errors are unintentional.  Bari Edward, MD [facility]  06/24/2017

## 2017-06-24 NOTE — Patient Instructions (Signed)
Dr. Rolene Arbouravid Tendler - gastroenterology did colonoscopy

## 2017-06-25 LAB — COMPREHENSIVE METABOLIC PANEL
ALT: 43 IU/L (ref 0–44)
AST: 23 IU/L (ref 0–40)
Albumin/Globulin Ratio: 1.9 (ref 1.2–2.2)
Albumin: 4.4 g/dL (ref 3.6–4.8)
Alkaline Phosphatase: 54 IU/L (ref 39–117)
BUN/Creatinine Ratio: 26 — ABNORMAL HIGH (ref 10–24)
BUN: 25 mg/dL (ref 8–27)
Bilirubin Total: 0.5 mg/dL (ref 0.0–1.2)
CALCIUM: 9.2 mg/dL (ref 8.6–10.2)
CO2: 21 mmol/L (ref 20–29)
CREATININE: 0.98 mg/dL (ref 0.76–1.27)
Chloride: 104 mmol/L (ref 96–106)
GFR calc Af Amer: 96 mL/min/{1.73_m2} (ref >59)
GFR, EST NON AFRICAN AMERICAN: 83 mL/min/{1.73_m2} (ref >59)
GLUCOSE: 99 mg/dL (ref 65–99)
Globulin, Total: 2.3 g/dL (ref 1.5–4.5)
Potassium: 4.6 mmol/L (ref 3.5–5.2)
Sodium: 141 mmol/L (ref 134–144)
Total Protein: 6.7 g/dL (ref 6.0–8.5)

## 2017-06-25 LAB — HEMOGLOBIN A1C
ESTIMATED AVERAGE GLUCOSE: 151 mg/dL
HEMOGLOBIN A1C: 6.9 % — AB (ref 4.8–5.6)

## 2017-06-25 LAB — LIPID PANEL
CHOLESTEROL TOTAL: 208 mg/dL — AB (ref 100–199)
Chol/HDL Ratio: 6.5 ratio — ABNORMAL HIGH (ref 0.0–5.0)
HDL: 32 mg/dL — AB (ref >39)
LDL Calculated: 123 mg/dL — ABNORMAL HIGH (ref 0–99)
TRIGLYCERIDES: 265 mg/dL — AB (ref 0–149)
VLDL Cholesterol Cal: 53 mg/dL — ABNORMAL HIGH (ref 5–40)

## 2017-06-29 LAB — MICROALBUMIN / CREATININE URINE RATIO
Creatinine, Urine: 83.9 mg/dL
Microalbumin, Urine: <3 ug/mL

## 2017-08-17 ENCOUNTER — Encounter: Payer: Self-pay | Admitting: Internal Medicine

## 2017-08-17 ENCOUNTER — Other Ambulatory Visit: Payer: Self-pay | Admitting: Internal Medicine

## 2017-08-17 ENCOUNTER — Ambulatory Visit
Admission: RE | Admit: 2017-08-17 | Discharge: 2017-08-17 | Disposition: A | Discharge status: Home / Self Care | Payer: 59 | Source: Ambulatory Visit | Attending: Internal Medicine | Admitting: Internal Medicine

## 2017-08-17 ENCOUNTER — Ambulatory Visit: Payer: 59 | Admitting: Internal Medicine

## 2017-08-17 VITALS — BP 130/62 | HR 70 | Temp 98.4°F | Resp 16 | Ht 71.5 in | Wt 266.0 lb

## 2017-08-17 DIAGNOSIS — M1611 Unilateral primary osteoarthritis, right hip: Secondary | ICD-10-CM | POA: Diagnosis not present

## 2017-08-17 DIAGNOSIS — I739 Peripheral vascular disease, unspecified: Secondary | ICD-10-CM | POA: Insufficient documentation

## 2017-08-17 DIAGNOSIS — M47896 Other spondylosis, lumbar region: Secondary | ICD-10-CM | POA: Insufficient documentation

## 2017-08-17 DIAGNOSIS — M25552 Pain in left hip: Secondary | ICD-10-CM | POA: Diagnosis not present

## 2017-08-17 DIAGNOSIS — M25551 Pain in right hip: Secondary | ICD-10-CM

## 2017-08-17 DIAGNOSIS — M545 Low back pain: Secondary | ICD-10-CM | POA: Diagnosis not present

## 2017-08-17 DIAGNOSIS — M1612 Unilateral primary osteoarthritis, left hip: Secondary | ICD-10-CM | POA: Diagnosis not present

## 2017-08-17 DIAGNOSIS — M24852 Other specific joint derangements of left hip, not elsewhere classified: Secondary | ICD-10-CM | POA: Insufficient documentation

## 2017-08-17 DIAGNOSIS — M24851 Other specific joint derangements of right hip, not elsewhere classified: Secondary | ICD-10-CM | POA: Diagnosis not present

## 2017-08-17 DIAGNOSIS — I708 Atherosclerosis of other arteries: Secondary | ICD-10-CM | POA: Diagnosis not present

## 2017-08-17 DIAGNOSIS — M5431 Sciatica, right side: Secondary | ICD-10-CM | POA: Diagnosis not present

## 2017-08-17 DIAGNOSIS — M4316 Spondylolisthesis, lumbar region: Secondary | ICD-10-CM | POA: Diagnosis not present

## 2017-08-17 DIAGNOSIS — I700 Atherosclerosis of aorta: Secondary | ICD-10-CM | POA: Insufficient documentation

## 2017-08-17 MED ORDER — PREDNISONE 10 MG PO TABS: ORAL_TABLET | ORAL | 0 refills | Status: DC

## 2017-08-17 NOTE — Progress Notes (Signed)
Date:  08/17/2017   Name:  Wyatt Mata   DOB:  February 11, 1956   MRN:  [id number]   Chief Complaint: Leg Pain (constant 3-4 weeks, burning, stinging, numbness, tingling, right leg)  He has had intermittent back pain for a while.  No hx of surgery.  Last lumbar xrays in 2011. He has been having more low back pain and did some work this weekend that aggravated it. He has burning numbness down his lateral right thigh from hip to knee.  No weakness, loss of bowel or bladder control or perineal numbness. He takes goodys powders twice a day.   Review of Systems  Respiratory: Negative for cough, chest tightness and shortness of breath.   Cardiovascular: Negative for chest pain and palpitations.  Genitourinary: Negative for difficulty urinating.  Musculoskeletal: Positive for arthralgias, back pain and myalgias.  Neurological: Positive for numbness (and burning down lateral right leg). Negative for dizziness, tremors, syncope and weakness.    Patient Active Problem List   Diagnosis Date Noted  . Uncontrolled type 2 diabetes mellitus without complication, without long-term current use of insulin (HCC) 10/10/2015  . Low serum testosterone 10/10/2015  . Hematuria, gross 10/15/2014  . Allergic rhinitis 08/05/2014  . CAD in native artery 08/05/2014  . Hyperlipidemia associated with type 2 diabetes mellitus (HCC) 08/05/2014  . Epididymal pain 08/05/2014  . 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 Essential (primary) hypertension 08/05/2014  . Acid reflux 08/05/2014  . History of colon polyps 08/05/2014  . Obstructive apnea 08/05/2014  . 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 Authorizing Provider  amLODipine (NORVASC) 5 MG tablet TAKE 1 TABLET BY MOUTH EVERY DAY 09/26/16  Yes Reubin Milan, MD  atorvastatin (LIPITOR) 80 MG tablet Take 80 mg daily by  mouth. 01/30/17  Yes [provider]  BAYER CONTOUR NEXT TEST test strip USE TO TEST BLOOD GLUCOSE ONCE DAILY 12/10/15  Yes Reubin Milan, MD  Choline Fenofibrate (FENOFIBRIC ACID) 135 MG CPDR TAKE 1 CAPSULE BY MOUTH EVERY DAY 10/26/16  Yes Reubin Milan, MD  cyclobenzaprine (FLEXERIL) 10 MG tablet Take 1 tablet (10 mg total) at bedtime as needed by mouth for muscle spasms. 02/04/17  Yes Reubin Milan, MD  gabapentin (NEURONTIN) 300 MG capsule TAKE 1 CAPSULE(300 MG) BY MOUTH TWICE DAILY 04/10/17  Yes Reubin Milan, MD  JANUMET XR [phone] MG TB24 TAKE 1 TABLET BY MOUTH DAILY 05/13/17  Yes Reubin Milan, MD  JARDIANCE 25 MG TABS tablet TAKE 1 TABLET BY MOUTH DAILY 03/10/17  Yes Reubin Milan, MD  lisinopril (PRINIVIL,ZESTRIL) 10 MG tablet TAKE 1 TABLET BY MOUTH DAILY 03/10/17  Yes Reubin Milan, MD  metoprolol succinate (TOPROL-XL) 100 MG 24 hr tablet TAKE 1 TABLET BY MOUTH TWICE DAILY 03/10/17  Yes Reubin Milan, MD  MICROLET LANCETS MISC USE EACH DAY 10/30/15  Yes Reubin Milan, MD  niacin (NIASPAN) 1000 MG CR tablet TAKE 1 TABLET BY MOUTH AT BEDTIME 10/26/16  Yes Reubin Milan, MD  REPATHA PUSHTRONEX SYSTEM 420 MG/3.5ML SOCT Inject 1 Dose into the muscle every 30 (thirty) days. 06/07/17  Yes [provider]  sildenafil (VIAGRA) 100 MG tablet Take 0.5-1 tablets (50-100 mg total) by mouth daily as needed for erectile dysfunction. 12/01/16  Yes Reubin Milan, MD    No Known Allergies  Past Surgical History:  Procedure Laterality Date  . COLONOSCOPY  08/16/2014   tubular adenoma  . CORONARY ANGIOPLASTY WITH STENT PLACEMENT  2013  . CORONARY ANGIOPLASTY WITH STENT PLACEMENT  01/2015   2 stents  - post circ and OM2  . CORONARY ARTERY BYPASS GRAFT  2004    Social History   Tobacco Use  . Smoking status: Former Games developer  . Smokeless tobacco: Never Used  Substance Use Topics  . Alcohol use: No    Alcohol/week: 0.0 oz  . Drug use: No      Medication list has been reviewed and updated.  Current Meds  Medication Sig  . amLODipine (NORVASC) 5 MG tablet TAKE 1 TABLET BY MOUTH EVERY DAY  . atorvastatin (LIPITOR) 80 MG tablet Take 80 mg daily by mouth.  Marland Kitchen BAYER CONTOUR NEXT TEST test strip USE TO TEST BLOOD GLUCOSE ONCE DAILY  . Choline Fenofibrate (FENOFIBRIC ACID) 135 MG CPDR TAKE 1 CAPSULE BY MOUTH EVERY DAY  . cyclobenzaprine (FLEXERIL) 10 MG tablet Take 1 tablet (10 mg total) at bedtime as needed by mouth for muscle spasms.  Marland Kitchen gabapentin (NEURONTIN) 300 MG capsule TAKE 1 CAPSULE(300 MG) BY MOUTH TWICE DAILY  . JANUMET XR [phone] MG TB24 TAKE 1 TABLET BY MOUTH DAILY  . JARDIANCE 25 MG TABS tablet TAKE 1 TABLET BY MOUTH DAILY  . lisinopril (PRINIVIL,ZESTRIL) 10 MG tablet TAKE 1 TABLET BY MOUTH DAILY  . metoprolol succinate (TOPROL-XL) 100 MG 24 hr tablet TAKE 1 TABLET BY MOUTH TWICE DAILY  . MICROLET LANCETS MISC USE EACH DAY  . niacin (NIASPAN) 1000 MG CR tablet TAKE 1 TABLET BY MOUTH AT BEDTIME  . REPATHA PUSHTRONEX SYSTEM 420 MG/3.5ML SOCT Inject 1 Dose into the muscle every 30 (thirty) days.  . sildenafil (VIAGRA) 100 MG tablet Take 0.5-1 tablets (50-100 mg total) by mouth daily as needed for erectile dysfunction.    PHQ 2/9 Scores 02/04/2017  PHQ - 2 Score 0    Physical Exam  Constitutional: He is oriented to person, place, and time. He appears well-developed. No distress.  HENT:  Head: Normocephalic and atraumatic.  Neck: Normal range of motion. Neck supple.  Cardiovascular: Normal rate, regular rhythm and normal heart sounds.  Pulmonary/Chest: Effort normal and breath sounds normal. No respiratory distress.  Abdominal: Soft. Bowel sounds are normal.  Musculoskeletal:       Right hip: He exhibits decreased range of motion (internal rotation).       Left hip: He exhibits decreased range of motion (internal rotation).       Lumbar back: He exhibits bony tenderness. He exhibits no deformity, no pain and no  spasm.  SLR positive on right  Neurological: He is alert and oriented to person, place, and time.  Skin: Skin is warm and dry. No rash noted.  Psychiatric: He has a normal mood and affect. His behavior is normal. Thought content normal.  Nursing note and vitals reviewed.   BP 130/62   Pulse 70   Temp 98.4 F (36.9 C) (Oral)   Resp 16   Ht 5' 11.5" (1.816 m)   Wt 266 lb (120.7 kg)   SpO2 93%   BMI 36.58 kg/m   Assessment and Plan: 1. Sciatica of right side Will get lumbar films and begin prednisone taper - DG Lumbar Spine Complete; Future - predniSONE (DELTASONE) 10 MG tablet; Take 6 on day 1, 5 on day 2, 4 on day 3, 3 on day 4, 2 on day 5 and 1 on day 1  then stop.  Dispense: 21 tablet; Refill: 0  2. Pain of both hip joints Check xrays - DG HIPS BILAT WITH PELVIS 3-4 VIEWS; Future   Meds ordered this encounter  Medications  . predniSONE (DELTASONE) 10 MG tablet    Sig: Take 6 on day 1, 5 on day 2, 4 on day 3, 3 on day 4, 2 on day 5 and 1 on day 1 then stop.    Dispense:  21 tablet    Refill:  0    Partially dictated using Animal nutritionist. Any errors are unintentional.  Bari Edward, MD [facility]  08/17/2017   There are no diagnoses linked to this encounter.

## 2017-08-19 DIAGNOSIS — N320 Bladder-neck obstruction: Secondary | ICD-10-CM | POA: Diagnosis not present

## 2017-08-19 DIAGNOSIS — R3129 Other microscopic hematuria: Secondary | ICD-10-CM | POA: Diagnosis not present

## 2017-08-19 DIAGNOSIS — N453 Epididymo-orchitis: Secondary | ICD-10-CM | POA: Diagnosis not present

## 2017-08-19 DIAGNOSIS — N318 Other neuromuscular dysfunction of bladder: Secondary | ICD-10-CM | POA: Diagnosis not present

## 2017-08-24 DIAGNOSIS — D120 Benign neoplasm of cecum: Secondary | ICD-10-CM | POA: Diagnosis not present

## 2017-08-24 DIAGNOSIS — Z86010 Personal history of colonic polyps: Secondary | ICD-10-CM | POA: Diagnosis not present

## 2017-08-24 HISTORY — PX: COLONOSCOPY: SHX174

## 2017-08-29 ENCOUNTER — Encounter: Payer: Self-pay | Admitting: Internal Medicine

## 2017-09-14 ENCOUNTER — Telehealth: Payer: Self-pay

## 2017-09-14 DIAGNOSIS — M62830 Muscle spasm of back: Secondary | ICD-10-CM

## 2017-09-14 NOTE — Telephone Encounter (Signed)
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

## 2017-09-14 NOTE — Telephone Encounter (Signed)
He can take cyclobenzaprine if he has muscle spasm and he can take tylenol for pain.

## 2017-09-16 ENCOUNTER — Other Ambulatory Visit: Payer: Self-pay | Admitting: Internal Medicine

## 2017-09-16 DIAGNOSIS — E785 Hyperlipidemia, unspecified: Secondary | ICD-10-CM

## 2017-09-16 MED ORDER — CYCLOBENZAPRINE HCL 10 MG PO TABS: 10.0000 mg | ORAL_TABLET | Freq: Every evening | ORAL | 0 refills | Status: DC | PRN

## 2017-09-16 NOTE — Telephone Encounter (Signed)
Sent refill and notified patient

## 2017-10-03 ENCOUNTER — Other Ambulatory Visit: Payer: Self-pay | Admitting: Internal Medicine

## 2017-10-04 ENCOUNTER — Telehealth: Payer: Self-pay

## 2017-10-04 NOTE — Telephone Encounter (Signed)
Called patient and spoke with him asking how much Flexeril medication he has left and is taking daily. He stated he "doesn't think" he took that medication because he thought it has something to do with urinary output.  He wrote down the name of the medication as I spelled it our for him, and he said he will go home tonight and see if he still has it. I explained to him this medication was given in the past for his sciatica, and to call me back tomorrow with more information.  cnm

## 2017-10-04 NOTE — Telephone Encounter (Signed)
-----   Message from Reubin MilanLaura H Berglund, MD sent at 10/03/2017  4:19 PM EDT ----- Regarding: RE: refill He should only be taking one flexeril per day.  He just got it filled 2 weeks ago.  So unless he is taking more than one a day, it is too early. Dr. Judithann GravesBerglund ----- Message ----- From: Letta MedianHolden, Jamie Ann, CMA Sent: 10/03/2017  11:46 AM To: Reubin MilanLaura H Berglund, MD Subject: refill                                         Requests refill on Flexeril for sciatic pain last filled 6/28/219 30 count. [facility] send any message related to this to Leelah Hanna.

## 2017-10-10 ENCOUNTER — Ambulatory Visit (INDEPENDENT_AMBULATORY_CARE_PROVIDER_SITE_OTHER): Payer: 59 | Admitting: Internal Medicine

## 2017-10-10 ENCOUNTER — Encounter: Payer: Self-pay | Admitting: Internal Medicine

## 2017-10-10 ENCOUNTER — Telehealth: Payer: Self-pay

## 2017-10-10 VITALS — BP 102/60 | HR 62 | Ht 71.0 in | Wt 261.0 lb

## 2017-10-10 DIAGNOSIS — I10 Essential (primary) hypertension: Secondary | ICD-10-CM

## 2017-10-10 DIAGNOSIS — E785 Hyperlipidemia, unspecified: Secondary | ICD-10-CM

## 2017-10-10 DIAGNOSIS — M51369 Other intervertebral disc degeneration, lumbar region without mention of lumbar back pain or lower extremity pain: Secondary | ICD-10-CM | POA: Insufficient documentation

## 2017-10-10 DIAGNOSIS — Z125 Encounter for screening for malignant neoplasm of prostate: Secondary | ICD-10-CM | POA: Diagnosis not present

## 2017-10-10 DIAGNOSIS — Z0000 Encounter for general adult medical examination without abnormal findings: Secondary | ICD-10-CM

## 2017-10-10 DIAGNOSIS — E1169 Type 2 diabetes mellitus with other specified complication: Secondary | ICD-10-CM | POA: Diagnosis not present

## 2017-10-10 DIAGNOSIS — E1165 Type 2 diabetes mellitus with hyperglycemia: Secondary | ICD-10-CM | POA: Diagnosis not present

## 2017-10-10 DIAGNOSIS — M5136 Other intervertebral disc degeneration, lumbar region: Secondary | ICD-10-CM | POA: Diagnosis not present

## 2017-10-10 DIAGNOSIS — Z0001 Encounter for general adult medical examination with abnormal findings: Secondary | ICD-10-CM

## 2017-10-10 DIAGNOSIS — I25810 Atherosclerosis of coronary artery bypass graft(s) without angina pectoris: Secondary | ICD-10-CM

## 2017-10-10 DIAGNOSIS — IMO0001 Reserved for inherently not codable concepts without codable children: Secondary | ICD-10-CM

## 2017-10-10 LAB — POCT URINALYSIS DIPSTICK
BILIRUBIN UA: NEGATIVE
Blood, UA: NEGATIVE
GLUCOSE UA: POSITIVE — AB
Ketones, UA: NEGATIVE
Leukocytes, UA: NEGATIVE
Nitrite, UA: NEGATIVE
Protein, UA: NEGATIVE
Spec Grav, UA: 1.02 (ref 1.010–1.025)
Urobilinogen, UA: 0.2 E.U./dL
pH, UA: 6 (ref 5.0–8.0)

## 2017-10-10 MED ORDER — PREDNISONE 10 MG PO TABS: ORAL_TABLET | ORAL | 0 refills | Status: DC

## 2017-10-10 NOTE — Telephone Encounter (Signed)
ERROR

## 2017-10-10 NOTE — Progress Notes (Signed)
Date:  10/10/2017   Name:  Wyatt ProseMichael D Gillyard   DOB:  1956-02-26   MRN:  [id number]   Chief Complaint: Annual Exam Wyatt ProseMichael D Wymore is a 62 y.o. male who presents today for his Complete Annual Exam. He feels fairly well. He reports exercising none. He reports he is sleeping well. Recent colonoscopy showed 2 benign polyps - follow up 5 yrs.  He had his eye exam within the past year.  Diabetes  He presents for his follow-up diabetic visit. He has type 2 diabetes mellitus. His disease course has been stable. Pertinent negatives for hypoglycemia include no dizziness or headaches. Pertinent negatives for diabetes include no chest pain and no fatigue. Current diabetic treatment includes oral agent (triple therapy). He is compliant with treatment most of the time.  Hypertension  This is a chronic problem. The problem is unchanged. The problem is controlled. Pertinent negatives include no chest pain, headaches, palpitations or shortness of breath.  Hyperlipidemia  This is a chronic problem. The problem is controlled. Pertinent negatives include no chest pain, myalgias or shortness of breath.  Back Pain  This is a recurrent problem. The pain is present in the lumbar spine. The quality of the pain is described as aching. The pain radiates to the right thigh. The pain is moderate. Associated symptoms include abdominal pain. Pertinent negatives include no bladder incontinence, bowel incontinence, chest pain, dysuria, headaches or perianal numbness. He has tried NSAIDs and muscle relaxant (and prednisone) for the symptoms. Improvement on treatment: prednisone helped but not lasting.   Lab Results  Component Value Date   CHOL 208 (H) 06/24/2017   HDL 32 (L) 06/24/2017   LDLCALC 123 (H) 06/24/2017   TRIG 265 (H) 06/24/2017   CHOLHDL 6.5 (H) 06/24/2017      Review of Systems  Constitutional: Negative for appetite change, chills, diaphoresis, fatigue and unexpected weight change.  HENT: Negative for  hearing loss, tinnitus, trouble swallowing and voice change.   Eyes: Negative for visual disturbance.  Respiratory: Negative for choking, shortness of breath and wheezing.   Cardiovascular: Negative for chest pain, palpitations and leg swelling.  Gastrointestinal: Positive for abdominal pain. Negative for blood in stool, bowel incontinence, constipation and diarrhea.  Genitourinary: Negative for bladder incontinence, difficulty urinating, dysuria, frequency, hematuria and urgency.  Musculoskeletal: Positive for arthralgias and back pain. Negative for myalgias.  Skin: Negative for color change and rash.  Allergic/Immunologic: Negative for environmental allergies and food allergies.  Neurological: Negative for dizziness, syncope and headaches.  Hematological: Negative for adenopathy.  Psychiatric/Behavioral: Negative for dysphoric mood and sleep disturbance.    Patient Active Problem List   Diagnosis Date Noted  . Uncontrolled type 2 diabetes mellitus without complication, without long-term current use of insulin (HCC) 10/10/2015  . Low serum testosterone 10/10/2015  . Hematuria, gross 10/15/2014  . Allergic rhinitis 08/05/2014  . CAD in native artery 08/05/2014  . Hyperlipidemia associated with type 2 diabetes mellitus (HCC) 08/05/2014  . Epididymal pain 08/05/2014  . 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 Essential (primary) hypertension 08/05/2014  . Acid reflux 08/05/2014  . History of colon polyps 08/05/2014  . Obstructive apnea 08/05/2014  . 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 Authorizing Provider  amLODipine (NORVASC) 5 MG tablet TAKE 1 TABLET BY MOUTH EVERY DAY 09/26/16  Yes Reubin MilanBerglund, Kito Cuffe H, MD  atorvastatin (LIPITOR) 80 MG tablet  Take 80 mg daily by mouth. 01/30/17  Yes [provider]  BAYER CONTOUR NEXT TEST test strip USE TO  TEST BLOOD GLUCOSE ONCE DAILY 12/10/15  Yes Reubin Milan, MD  Choline Fenofibrate (FENOFIBRIC ACID) 135 MG CPDR TAKE 1 CAPSULE BY MOUTH EVERY DAY 10/26/16  Yes Reubin Milan, MD  cyclobenzaprine (FLEXERIL) 10 MG tablet Take 1 tablet (10 mg total) by mouth at bedtime as needed for muscle spasms. 09/16/17  Yes Reubin Milan, MD  JARDIANCE 25 MG TABS tablet TAKE 1 TABLET BY MOUTH DAILY 03/10/17  Yes Reubin Milan, MD  lisinopril (PRINIVIL,ZESTRIL) 10 MG tablet TAKE 1 TABLET BY MOUTH DAILY 03/10/17  Yes Reubin Milan, MD  metoprolol succinate (TOPROL-XL) 100 MG 24 hr tablet TAKE 1 TABLET BY MOUTH TWICE DAILY 03/10/17  Yes Reubin Milan, MD  MICROLET LANCETS MISC USE EACH DAY 10/30/15  Yes Reubin Milan, MD  niacin (NIASPAN) 1000 MG CR tablet TAKE 1 TABLET BY MOUTH AT BEDTIME 10/26/16  Yes Reubin Milan, MD  REPATHA PUSHTRONEX SYSTEM 420 MG/3.5ML SOCT Inject 1 Dose into the muscle every 30 (thirty) days. 06/07/17  Yes [provider]  sildenafil (VIAGRA) 100 MG tablet Take 0.5-1 tablets (50-100 mg total) by mouth daily as needed for erectile dysfunction. 12/01/16  Yes Reubin Milan, MD  tamsulosin (FLOMAX) 0.4 MG CAPS capsule TK ONE C PO D 30 MIN AFTER SUPPER 08/19/17  Yes [provider]  gabapentin (NEURONTIN) 300 MG capsule TAKE 1 CAPSULE(300 MG) BY MOUTH TWICE DAILY 04/10/17   Reubin Milan, MD  JANUMET XR [phone] MG TB24 TAKE 1 TABLET BY MOUTH DAILY 05/13/17   Reubin Milan, MD    No Known Allergies  Past Surgical History:  Procedure Laterality Date  . COLONOSCOPY  08/16/2014   tubular adenoma  . COLONOSCOPY  08/24/2017   one polyp - repeat 5 yrs  . CORONARY ANGIOPLASTY WITH STENT PLACEMENT  2013  . CORONARY ANGIOPLASTY WITH STENT PLACEMENT  01/2015   2 stents  - post circ and OM2  . CORONARY ARTERY BYPASS GRAFT  2004    Social History   Tobacco Use  . Smoking status: Former Games developer  . Smokeless tobacco: Never Used  Substance Use  Topics  . Alcohol use: No    Alcohol/week: 0.0 oz  . Drug use: No     Medication list has been reviewed and updated.  Current Meds  Medication Sig  . amLODipine (NORVASC) 5 MG tablet TAKE 1 TABLET BY MOUTH EVERY DAY  . atorvastatin (LIPITOR) 80 MG tablet Take 80 mg daily by mouth.  Marland Kitchen BAYER CONTOUR NEXT TEST test strip USE TO TEST BLOOD GLUCOSE ONCE DAILY  . Choline Fenofibrate (FENOFIBRIC ACID) 135 MG CPDR TAKE 1 CAPSULE BY MOUTH EVERY DAY  . cyclobenzaprine (FLEXERIL) 10 MG tablet Take 1 tablet (10 mg total) by mouth at bedtime as needed for muscle spasms.  Marland Kitchen JARDIANCE 25 MG TABS tablet TAKE 1 TABLET BY MOUTH DAILY  . lisinopril (PRINIVIL,ZESTRIL) 10 MG tablet TAKE 1 TABLET BY MOUTH DAILY  . metoprolol succinate (TOPROL-XL) 100 MG 24 hr tablet TAKE 1 TABLET BY MOUTH TWICE DAILY  . MICROLET LANCETS MISC USE EACH DAY  . niacin (NIASPAN) 1000 MG CR tablet TAKE 1 TABLET BY MOUTH AT BEDTIME  . REPATHA PUSHTRONEX SYSTEM 420 MG/3.5ML SOCT Inject 1 Dose into the muscle every 30 (thirty) days.  . sildenafil (VIAGRA) 100 MG tablet Take 0.5-1 tablets (50-100 mg total)  by mouth daily as needed for erectile dysfunction.  . tamsulosin (FLOMAX) 0.4 MG CAPS capsule TK ONE C PO D 30 MIN AFTER SUPPER    PHQ 2/9 Scores 02/04/2017  PHQ - 2 Score 0    Physical Exam  Constitutional: He is oriented to person, place, and time. He appears well-developed and well-nourished.  HENT:  Head: Normocephalic.  Right Ear: Tympanic membrane, external ear and ear canal normal.  Left Ear: Tympanic membrane, external ear and ear canal normal.  Nose: Nose normal.  Mouth/Throat: Uvula is midline and oropharynx is clear and moist.  Eyes: Pupils are equal, round, and reactive to light. Conjunctivae and EOM are normal.  Neck: Normal range of motion. Neck supple. Carotid bruit is not present. No thyromegaly present.  Cardiovascular: Normal rate, regular rhythm, normal heart sounds and intact distal pulses.    Pulmonary/Chest: Effort normal and breath sounds normal. He has no wheezes. Right breast exhibits no mass. Left breast exhibits no mass.  Abdominal: Soft. Normal appearance and bowel sounds are normal. There is no hepatosplenomegaly. There is tenderness (LLQ).  Musculoskeletal: Normal range of motion.       Lumbar back: He exhibits no bony tenderness and no spasm.  Lymphadenopathy:    He has no cervical adenopathy.  Neurological: He is alert and oriented to person, place, and time. He has normal strength and normal reflexes. No cranial nerve deficit or sensory deficit.  SLR negative bilaterally  Skin: Skin is warm, dry and intact.  Psychiatric: He has a normal mood and affect. His speech is normal and behavior is normal. Judgment and thought content normal.  Nursing note and vitals reviewed.   BP 102/60 (BP Location: Right Arm, Patient Position: Sitting, Cuff Size: Large)   Pulse 62   Ht 5\' 11"  (1.803 m)   Wt 261 lb (118.4 kg)   SpO2 97%   BMI 36.40 kg/m   Assessment and Plan: 1. Annual physical exam Recent colonoscopy was normal - POCT urinalysis dipstick  2. Prostate cancer screening DRE deferred - PSA  3. Essential (primary) hypertension controlled - CBC with Differential/Platelet - Comprehensive metabolic panel  4. Hyperlipidemia associated with type 2 diabetes mellitus (HCC) On statin therapy - Lipid panel  5. Uncontrolled type 2 diabetes mellitus without complication, without long-term current use of insulin (HCC) Continue oral agents - Hemoglobin A1c  6. 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 DDD (degenerative disc disease), lumbar Will give one more round of prednisone May need to see Ortho Continue Goody's bid - predniSONE (DELTASONE) 10 MG tablet; Take 6 on day 1, 5 on day 2, 4 on day 3, 3 on day 4, 2 on day 5 and 1 on day 1 then stop.  Dispense: 21 tablet; Refill:  0   Meds ordered this encounter  Medications  . predniSONE (DELTASONE) 10 MG tablet    Sig: Take 6 on day 1, 5 on day 2, 4 on day 3, 3 on day 4, 2 on day 5 and 1 on day 1 then stop.    Dispense:  21 tablet    Refill:  0    Partially dictated using Animal nutritionist. Any errors are unintentional.  Bari Edward, MD [facility]  10/10/2017  There are no diagnoses linked to this encounter.

## 2017-10-11 LAB — CBC WITH DIFFERENTIAL/PLATELET
BASOS ABS: 0 10*3/uL (ref 0.0–0.2)
Basos: 1 %
EOS (ABSOLUTE): 0.2 10*3/uL (ref 0.0–0.4)
Eos: 4 %
Hematocrit: 48.8 % (ref 37.5–51.0)
Hemoglobin: 16.8 g/dL (ref 13.0–17.7)
Immature Grans (Abs): 0 10*3/uL (ref 0.0–0.1)
Immature Granulocytes: 0 %
LYMPHS ABS: 1.5 10*3/uL (ref 0.7–3.1)
Lymphs: 29 %
MCH: 30.8 pg (ref 26.6–33.0)
MCHC: 34.4 g/dL (ref 31.5–35.7)
MCV: 89 fL (ref 79–97)
Monocytes Absolute: 0.5 10*3/uL (ref 0.1–0.9)
Monocytes: 10 %
NEUTROS ABS: 2.8 10*3/uL (ref 1.4–7.0)
Neutrophils: 56 %
Platelets: 147 10*3/uL — ABNORMAL LOW (ref 150–450)
RBC: 5.46 x10E6/uL (ref 4.14–5.80)
RDW: 15.4 % (ref 12.3–15.4)
WBC: 5.1 10*3/uL (ref 3.4–10.8)

## 2017-10-11 LAB — HEMOGLOBIN A1C
ESTIMATED AVERAGE GLUCOSE: 151 mg/dL
HEMOGLOBIN A1C: 6.9 % — AB (ref 4.8–5.6)

## 2017-10-11 LAB — COMPREHENSIVE METABOLIC PANEL
A/G RATIO: 2 (ref 1.2–2.2)
ALBUMIN: 4.4 g/dL (ref 3.6–4.8)
ALK PHOS: 49 IU/L (ref 39–117)
ALT: 37 IU/L (ref 0–44)
AST: 36 IU/L (ref 0–40)
BILIRUBIN TOTAL: 0.5 mg/dL (ref 0.0–1.2)
BUN/Creatinine Ratio: 14 (ref 10–24)
BUN: 18 mg/dL (ref 8–27)
CHLORIDE: 105 mmol/L (ref 96–106)
CO2: 18 mmol/L — ABNORMAL LOW (ref 20–29)
Calcium: 9.7 mg/dL (ref 8.6–10.2)
Creatinine, Ser: 1.29 mg/dL — ABNORMAL HIGH (ref 0.76–1.27)
GFR calc Af Amer: 68 mL/min/{1.73_m2} (ref >59)
GFR calc non Af Amer: 59 mL/min/{1.73_m2} — ABNORMAL LOW (ref >59)
GLOBULIN, TOTAL: 2.2 g/dL (ref 1.5–4.5)
Glucose: 101 mg/dL — ABNORMAL HIGH (ref 65–99)
POTASSIUM: 5.4 mmol/L — AB (ref 3.5–5.2)
SODIUM: 142 mmol/L (ref 134–144)
Total Protein: 6.6 g/dL (ref 6.0–8.5)

## 2017-10-11 LAB — LIPID PANEL
CHOLESTEROL TOTAL: 182 mg/dL (ref 100–199)
Chol/HDL Ratio: 5.9 ratio — ABNORMAL HIGH (ref 0.0–5.0)
HDL: 31 mg/dL — ABNORMAL LOW (ref >39)
LDL CALC: 118 mg/dL — AB (ref 0–99)
Triglycerides: 163 mg/dL — ABNORMAL HIGH (ref 0–149)
VLDL Cholesterol Cal: 33 mg/dL (ref 5–40)

## 2017-10-11 LAB — PSA: PROSTATE SPECIFIC AG, SERUM: 1.9 ng/mL (ref 0.0–4.0)

## 2017-10-20 ENCOUNTER — Other Ambulatory Visit: Payer: Self-pay | Admitting: Internal Medicine

## 2017-10-20 DIAGNOSIS — IMO0001 Reserved for inherently not codable concepts without codable children: Secondary | ICD-10-CM

## 2017-10-20 DIAGNOSIS — E1165 Type 2 diabetes mellitus with hyperglycemia: Secondary | ICD-10-CM

## 2017-10-20 DIAGNOSIS — E785 Hyperlipidemia, unspecified: Secondary | ICD-10-CM

## 2017-10-21 ENCOUNTER — Encounter: Payer: Self-pay | Admitting: Internal Medicine

## 2017-10-21 ENCOUNTER — Ambulatory Visit: Payer: 59 | Admitting: Internal Medicine

## 2017-10-21 VITALS — BP 122/64 | HR 84 | Temp 98.1°F | Ht 71.0 in | Wt 258.0 lb

## 2017-10-21 DIAGNOSIS — J029 Acute pharyngitis, unspecified: Secondary | ICD-10-CM

## 2017-10-21 DIAGNOSIS — H6690 Otitis media, unspecified, unspecified ear: Secondary | ICD-10-CM

## 2017-10-21 DIAGNOSIS — M5136 Other intervertebral disc degeneration, lumbar region: Secondary | ICD-10-CM

## 2017-10-21 MED ORDER — AMOXICILLIN-POT CLAVULANATE 875-125 MG PO TABS: 1.0000 | ORAL_TABLET | Freq: Two times a day (BID) | ORAL | 0 refills | Status: AC

## 2017-10-21 NOTE — Patient Instructions (Signed)
Claritin or Allegra for drainage  Delsym cough syrup over the counter

## 2017-10-21 NOTE — Progress Notes (Signed)
Date:  10/21/2017   Name:  Donald ProseMichael D Stills   DOB:  Jul 31, 1955   MRN:  [id number]   Chief Complaint: Sore Throat (Trouble swallowing. Fever at night with chills. X 1 week. Cough with yellow/ darker yellow production.)  Sore Throat   This is a new problem. The current episode started in the past 7 days. The problem has been waxing and waning. The maximum temperature recorded prior to his arrival was 100.4 - 100.9 F. The pain is mild. Associated symptoms include ear pain. Pertinent negatives include no coughing, headaches, shortness of breath or trouble swallowing. He has tried acetaminophen for the symptoms.  Otalgia   There is pain in both ears. This is a new problem. The current episode started in the past 7 days. The problem has been unchanged. Associated symptoms include a sore throat. Pertinent negatives include no coughing, headaches, hearing loss or rash.  Back Pain  This is a recurrent problem. The current episode started more than 1 month ago. The problem has been gradually worsening since onset. The pain is present in the lumbar spine. The pain is moderate. Associated symptoms include a fever. Pertinent negatives include no headaches.     Review of Systems  Constitutional: Positive for fatigue and fever. Negative for chills.  HENT: Positive for ear pain, postnasal drip and sore throat. Negative for hearing loss, sinus pressure, sinus pain and trouble swallowing.   Eyes: Negative for visual disturbance.  Respiratory: Negative for cough, chest tightness, shortness of breath and wheezing.   Musculoskeletal: Positive for back pain.  Skin: Negative for rash.  Neurological: Negative for dizziness and headaches.    Patient Active Problem List   Diagnosis Date Noted  . DDD (degenerative disc disease), lumbar 10/10/2017  . Uncontrolled type 2 diabetes mellitus without complication, without long-term current use of insulin (HCC) 10/10/2015  . Low serum testosterone 10/10/2015  .  Hematuria, gross 10/15/2014  . Allergic rhinitis 08/05/2014  . CAD in native artery 08/05/2014  . Hyperlipidemia associated with type 2 diabetes mellitus (HCC) 08/05/2014  . Epididymal pain 08/05/2014  . 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 Essential (primary) hypertension 08/05/2014  . Acid reflux 08/05/2014  . History of colon polyps 08/05/2014  . Obstructive apnea 08/05/2014  . 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 Cyclobenzaprine Other (See Comments)    Abdominal pain    Past Surgical History:  Procedure Laterality Date  . COLONOSCOPY  08/16/2014   tubular adenoma  . COLONOSCOPY  08/24/2017   one polyp - repeat 5 yrs  . CORONARY ANGIOPLASTY WITH STENT PLACEMENT  2013  . CORONARY ANGIOPLASTY WITH STENT PLACEMENT  01/2015   2 stents  - post circ and OM2  . CORONARY ARTERY BYPASS GRAFT  2004    Social History   Tobacco Use  . Smoking status: Former Games developermoker  . Smokeless tobacco: Never Used  Substance Use Topics  . Alcohol use: No    Alcohol/week: 0.0 oz  . Drug use: No     Medication list has been reviewed and updated.  Current Meds  Medication Sig  . amLODipine (NORVASC) 5 MG tablet TAKE 1 TABLET BY MOUTH EVERY DAY  . atorvastatin (LIPITOR) 80 MG tablet Take 80 mg daily by mouth.  Marland Kitchen. BAYER CONTOUR NEXT TEST test strip USE TO TEST BLOOD GLUCOSE ONCE DAILY  . Choline Fenofibrate (FENOFIBRIC ACID) 135 MG CPDR TAKE 1 CAPSULE BY  MOUTH EVERY DAY  . Choline Fenofibrate (FENOFIBRIC ACID) 135 MG CPDR TAKE 1 CAPSULE BY MOUTH DAILY  . cyclobenzaprine (FLEXERIL) 10 MG tablet Take 1 tablet (10 mg total) by mouth at bedtime as needed for muscle spasms.  Marland Kitchen gabapentin (NEURONTIN) 300 MG capsule TAKE 1 CAPSULE(300 MG) BY MOUTH TWICE DAILY  . JANUMET XR [phone] MG TB24 TAKE 1 TABLET BY MOUTH DAILY  . JARDIANCE 25 MG TABS tablet TAKE 1 TABLET BY MOUTH DAILY  . lisinopril  (PRINIVIL,ZESTRIL) 10 MG tablet TAKE 1 TABLET BY MOUTH DAILY  . metoprolol succinate (TOPROL-XL) 100 MG 24 hr tablet TAKE 1 TABLET BY MOUTH TWICE DAILY  . MICROLET LANCETS MISC USE EACH DAY  . niacin (NIASPAN) 1000 MG CR tablet TAKE 1 TABLET BY MOUTH AT BEDTIME  . niacin (NIASPAN) 1000 MG CR tablet TAKE 1 TABLET(1000 MG) BY MOUTH AT BEDTIME  . predniSONE (DELTASONE) 10 MG tablet Take 6 on day 1, 5 on day 2, 4 on day 3, 3 on day 4, 2 on day 5 and 1 on day 1 then stop.  Marland Kitchen REPATHA PUSHTRONEX SYSTEM 420 MG/3.5ML SOCT Inject 1 Dose into the muscle every 30 (thirty) days.  . sildenafil (VIAGRA) 100 MG tablet Take 0.5-1 tablets (50-100 mg total) by mouth daily as needed for erectile dysfunction.  . tamsulosin (FLOMAX) 0.4 MG CAPS capsule TK ONE C PO D 30 MIN AFTER SUPPER    PHQ 2/9 Scores 02/04/2017  PHQ - 2 Score 0    Physical Exam  Constitutional: He is oriented to person, place, and time. He appears well-developed and well-nourished. No distress.  HENT:  Head: Normocephalic and atraumatic.  Right Ear: External ear and ear canal normal. Tympanic membrane is erythematous and retracted.  Left Ear: External ear and ear canal normal. Tympanic membrane is erythematous and retracted.  Nose: Right sinus exhibits no maxillary sinus tenderness and no frontal sinus tenderness. Left sinus exhibits no maxillary sinus tenderness and no frontal sinus tenderness.  Mouth/Throat: Uvula is midline and mucous membranes are normal. No oral lesions. Posterior oropharyngeal erythema present. No oropharyngeal exudate or posterior oropharyngeal edema.  Cardiovascular: Normal rate, regular rhythm and normal heart sounds.  Pulmonary/Chest: Effort normal and breath sounds normal. No respiratory distress. He has no wheezes. He has no rales.  Musculoskeletal:       Lumbar back: He exhibits decreased range of motion and spasm. He exhibits no bony tenderness.  Lymphadenopathy:    He has no cervical adenopathy.    Neurological: He is alert and oriented to person, place, and time.  Skin: Skin is warm and dry. No rash noted.  Psychiatric: He has a normal mood and affect. His behavior is normal. Thought content normal.  Nursing note and vitals reviewed.   BP 122/64   Pulse 84   Temp 98.1 F (36.7 C) (Oral)   Ht 5\' 11"  (1.803 m)   Wt 258 lb (117 kg)   SpO2 97%   BMI 35.98 kg/m   Assessment and Plan: 1. Acute otitis media, unspecified otitis media type Continue Goody powders - amoxicillin-clavulanate (AUGMENTIN) 875-125 MG tablet; Take 1 tablet by mouth 2 (two) times daily for 10 days.  Dispense: 20 tablet; Refill: 0  2. Pharyngitis, unspecified etiology rec Delsym for cough, allegra or claritin for PND - amoxicillin-clavulanate (AUGMENTIN) 875-125 MG tablet; Take 1 tablet by mouth 2 (two) times daily for 10 days.  Dispense: 20 tablet; Refill: 0  3. DDD (degenerative disc disease), lumbar Persistent despite 2 rounds  of prednisone and flexeril - Ambulatory referral to Orthopedic Surgery   Meds ordered this encounter  Medications  . amoxicillin-clavulanate (AUGMENTIN) 875-125 MG tablet    Sig: Take 1 tablet by mouth 2 (two) times daily for 10 days.    Dispense:  20 tablet    Refill:  0    Partially dictated using Animal nutritionist. Any errors are unintentional.  Bari Edward, MD [facility]  10/21/2017

## 2017-11-02 ENCOUNTER — Other Ambulatory Visit: Payer: Self-pay | Admitting: Internal Medicine

## 2017-11-02 ENCOUNTER — Telehealth: Payer: Self-pay

## 2017-11-02 MED ORDER — CLINDAMYCIN HCL 300 MG PO CAPS: 300.0000 mg | ORAL_CAPSULE | Freq: Three times a day (TID) | ORAL | 0 refills | Status: AC

## 2017-11-02 NOTE — Telephone Encounter (Signed)
I will send in a different antibiotic for his ears.  If that does not resolve the issue, he will need to see ENT.   I do not know what he is referring to about his toe.

## 2017-11-02 NOTE — Telephone Encounter (Signed)
Patient called stating he was seen two weeks ago for ear pain. Finished all the antibiotics and right ear began hurting again yesterday and much worse this morning. Wants advice?  Also, his right great toe is flaring up again- red and swollen.  Please advise?

## 2017-11-02 NOTE — Telephone Encounter (Signed)
I misunderstood message and patient was referring to throat and ear. Please disregard. Patient will take antibiotics and call if no improvement.

## 2017-11-04 DIAGNOSIS — I10 Essential (primary) hypertension: Secondary | ICD-10-CM | POA: Diagnosis not present

## 2017-11-04 DIAGNOSIS — I25810 Atherosclerosis of coronary artery bypass graft(s) without angina pectoris: Secondary | ICD-10-CM | POA: Diagnosis not present

## 2017-11-04 DIAGNOSIS — E7800 Pure hypercholesterolemia, unspecified: Secondary | ICD-10-CM | POA: Diagnosis not present

## 2017-11-11 DIAGNOSIS — M5416 Radiculopathy, lumbar region: Secondary | ICD-10-CM | POA: Insufficient documentation

## 2017-11-11 DIAGNOSIS — M4316 Spondylolisthesis, lumbar region: Secondary | ICD-10-CM | POA: Diagnosis not present

## 2017-11-11 DIAGNOSIS — M5136 Other intervertebral disc degeneration, lumbar region: Secondary | ICD-10-CM | POA: Diagnosis not present

## 2017-11-15 ENCOUNTER — Telehealth: Payer: Self-pay

## 2017-11-15 DIAGNOSIS — I25810 Atherosclerosis of coronary artery bypass graft(s) without angina pectoris: Secondary | ICD-10-CM | POA: Diagnosis not present

## 2017-11-15 NOTE — Telephone Encounter (Signed)
Called and left VM asking patient if taking those OTC meds. Awaiting call back .

## 2017-11-15 NOTE — Telephone Encounter (Signed)
Patient called stating his throat is still painful. We seen him 10/21/2017 and he was given amox/clav and he said this did not help him. Anything else we can give?  Please Advise.

## 2017-11-15 NOTE — Telephone Encounter (Signed)
Is he taking Allegra or Claritin for post nasal drainage?

## 2017-11-17 ENCOUNTER — Other Ambulatory Visit: Payer: Self-pay | Admitting: Internal Medicine

## 2017-11-17 DIAGNOSIS — M4316 Spondylolisthesis, lumbar region: Secondary | ICD-10-CM | POA: Diagnosis not present

## 2017-11-17 DIAGNOSIS — M5416 Radiculopathy, lumbar region: Secondary | ICD-10-CM | POA: Diagnosis not present

## 2017-11-17 DIAGNOSIS — H6690 Otitis media, unspecified, unspecified ear: Secondary | ICD-10-CM

## 2017-11-17 DIAGNOSIS — M4726 Other spondylosis with radiculopathy, lumbar region: Secondary | ICD-10-CM | POA: Diagnosis not present

## 2017-11-17 DIAGNOSIS — M5136 Other intervertebral disc degeneration, lumbar region: Secondary | ICD-10-CM | POA: Diagnosis not present

## 2017-11-17 MED ORDER — AZITHROMYCIN 250 MG PO TABS: ORAL_TABLET | ORAL | 0 refills | Status: AC

## 2017-11-17 NOTE — Telephone Encounter (Signed)
Advised patient and he will call after the next Abx are finished/ Increased pain in other ear now but wants to finish Abx before seeing ENT.

## 2017-11-17 NOTE — Telephone Encounter (Signed)
Patient called back stating he takes Claritin and it helps during the day but not at night. He said his throat is worse at night along with having a cough and thick phlegm coming up out of throat. He also said his throat irritation is now moving to his ear causing an ear ache. Wanted to know if he could try a different antibiotic? Please Advise.

## 2017-11-17 NOTE — Telephone Encounter (Signed)
I will send him another antibiotics but if he still has problems after that, he will need to see me again or ENT.

## 2017-11-24 DIAGNOSIS — M5136 Other intervertebral disc degeneration, lumbar region: Secondary | ICD-10-CM | POA: Diagnosis not present

## 2017-11-24 DIAGNOSIS — M5416 Radiculopathy, lumbar region: Secondary | ICD-10-CM | POA: Diagnosis not present

## 2017-11-24 DIAGNOSIS — M4316 Spondylolisthesis, lumbar region: Secondary | ICD-10-CM | POA: Diagnosis not present

## 2017-11-30 DIAGNOSIS — M4316 Spondylolisthesis, lumbar region: Secondary | ICD-10-CM | POA: Insufficient documentation

## 2017-12-05 DIAGNOSIS — H5203 Hypermetropia, bilateral: Secondary | ICD-10-CM | POA: Diagnosis not present

## 2017-12-05 DIAGNOSIS — H52209 Unspecified astigmatism, unspecified eye: Secondary | ICD-10-CM | POA: Diagnosis not present

## 2017-12-05 LAB — HM DIABETES EYE EXAM

## 2017-12-24 ENCOUNTER — Other Ambulatory Visit: Payer: Self-pay | Admitting: Internal Medicine

## 2018-01-13 ENCOUNTER — Ambulatory Visit (INDEPENDENT_AMBULATORY_CARE_PROVIDER_SITE_OTHER): Payer: 59

## 2018-01-13 DIAGNOSIS — Z23 Encounter for immunization: Secondary | ICD-10-CM

## 2018-02-12 DIAGNOSIS — I714 Abdominal aortic aneurysm, without rupture, unspecified: Secondary | ICD-10-CM | POA: Insufficient documentation

## 2018-02-12 NOTE — Progress Notes (Signed)
Date:  02/13/2018   Name:  Wyatt Mata   DOB:  03/29/1955   MRN:  [id number]   Chief Complaint: Hypertension  Diabetes  He presents for his follow-up diabetic visit. He has type 2 diabetes mellitus. His disease course has been stable. Pertinent negatives for hypoglycemia include no dizziness, headaches or tremors. Pertinent negatives for diabetes include no chest pain, no fatigue, no polydipsia and no polyuria. Current diabetic treatment includes oral agent (triple therapy) Alma Friendly, metformin, jardiance). He is following a generally healthy diet. When asked about meal planning, he reported none. An ACE inhibitor/angiotensin II receptor blocker is being taken. Eye exam is not current.  Hypertension  This is a chronic problem. The problem is controlled. Pertinent negatives include no chest pain, headaches, palpitations or shortness of breath. Past treatments include beta blockers, calcium channel blockers and ACE inhibitors. Hypertensive end-organ damage includes kidney disease and CAD/MI.  Hyperlipidemia  This is a chronic problem. The problem is resistant. Pertinent negatives include no chest pain or shortness of breath. Current antihyperlipidemic treatment includes statins, fibric acid derivatives and nicotinic acid (and Repatha was stopped). The current treatment provides moderate improvement of lipids.   Lab Results  Component Value Date   HGBA1C 6.9 (H) 10/10/2017   Lab Results  Component Value Date   CREATININE 1.29 (H) 10/10/2017   BUN 18 10/10/2017   NA 142 10/10/2017   K 5.4 (H) 10/10/2017   CL 105 10/10/2017   CO2 18 (L) 10/10/2017     Review of Systems  Constitutional: Negative for appetite change, fatigue and unexpected weight change.  HENT: Positive for postnasal drip. Negative for sinus pressure, sore throat and trouble swallowing.   Eyes: Negative for visual disturbance.  Respiratory: Negative for cough, shortness of breath and wheezing.   Cardiovascular:  Negative for chest pain, palpitations and leg swelling.  Gastrointestinal: Negative for abdominal pain and blood in stool.  Endocrine: Negative for polydipsia and polyuria.  Genitourinary: Negative for dysuria and hematuria.  Musculoskeletal: Positive for back pain (sciatica).  Skin: Negative for color change and rash.  Allergic/Immunologic: Negative for environmental allergies.  Neurological: Positive for light-headedness. Negative for dizziness, tremors, numbness and headaches.  Psychiatric/Behavioral: Negative for dysphoric mood and sleep disturbance.    Patient Active Problem List   Diagnosis Date Noted  . AAA (abdominal aortic aneurysm) (HCC) 02/12/2018  . Spondylolisthesis of lumbar region 11/30/2017  . Lumbar radiculopathy 11/11/2017  . DDD (degenerative disc disease), lumbar 10/10/2017  . Uncontrolled type 2 diabetes mellitus without complication, without long-term current use of insulin (HCC) 10/10/2015  . Low serum testosterone 10/10/2015  . Hematuria, gross 10/15/2014  . Allergic rhinitis 08/05/2014  . CAD in native artery 08/05/2014  . Hyperlipidemia associated with type 2 diabetes mellitus (HCC) 08/05/2014  . Epididymal pain 08/05/2014  . 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 Essential (primary) hypertension 08/05/2014  . Acid reflux 08/05/2014  . History of colon polyps 08/05/2014  . Obstructive apnea 08/05/2014  . 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 Cyclobenzaprine Other (See Comments)    Abdominal pain    Past Surgical History:  Procedure Laterality Date  . COLONOSCOPY  08/16/2014   tubular adenoma  . COLONOSCOPY  08/24/2017   one polyp - repeat 5 yrs  . CORONARY ANGIOPLASTY WITH STENT PLACEMENT  2013  . CORONARY ANGIOPLASTY WITH STENT PLACEMENT  01/2015   2 stents  - post circ  and OM2  . CORONARY ARTERY BYPASS GRAFT  2004    Social History   Tobacco Use    . Smoking status: Former Games developermoker  . Smokeless tobacco: Never Used  Substance Use Topics  . Alcohol use: No    Alcohol/week: 0.0 standard drinks  . Drug use: No     Medication list has been reviewed and updated.  Current Meds  Medication Sig  . amLODipine (NORVASC) 5 MG tablet TAKE 1 TABLET BY MOUTH EVERY DAY  . atorvastatin (LIPITOR) 80 MG tablet Take 80 mg daily by mouth.  Marland Kitchen. BAYER CONTOUR NEXT TEST test strip USE TO TEST BLOOD GLUCOSE ONCE DAILY  . gabapentin (NEURONTIN) 300 MG capsule TAKE 1 CAPSULE(300 MG) BY MOUTH TWICE DAILY  . JANUMET XR [phone] MG TB24 TAKE 1 TABLET BY MOUTH DAILY  . JARDIANCE 25 MG TABS tablet TAKE 1 TABLET BY MOUTH DAILY  . lisinopril (PRINIVIL,ZESTRIL) 10 MG tablet TAKE 1 TABLET BY MOUTH DAILY  . metoprolol succinate (TOPROL-XL) 100 MG 24 hr tablet TAKE 1 TABLET BY MOUTH TWICE DAILY  . MICROLET LANCETS MISC USE EACH DAY  . sildenafil (VIAGRA) 100 MG tablet TAKE 1/2 TO 1 TABLET BY MOUTH DAILY AS NEEDED FOR ERECTILE DYSFUNCTION  . [DISCONTINUED] Choline Fenofibrate (FENOFIBRIC ACID) 135 MG CPDR TAKE 1 CAPSULE BY MOUTH DAILY  . [DISCONTINUED] niacin (NIASPAN) 1000 MG CR tablet TAKE 1 TABLET(1000 MG) BY MOUTH AT BEDTIME  . [DISCONTINUED] tamsulosin (FLOMAX) 0.4 MG CAPS capsule TK ONE C PO D 30 MIN AFTER SUPPER    PHQ 2/9 Scores 02/13/2018 02/04/2017  PHQ - 2 Score 0 0  PHQ- 9 Score 0 -    Physical Exam  Constitutional: He is oriented to person, place, and time. He appears well-developed. No distress.  HENT:  Head: Normocephalic and atraumatic.  Eyes: Pupils are equal, round, and reactive to light.  Neck: Normal range of motion. Neck supple.  Cardiovascular: Normal rate, regular rhythm and normal heart sounds.  Pulmonary/Chest: Effort normal and breath sounds normal. No respiratory distress.  Musculoskeletal: He exhibits no edema.  Lymphadenopathy:    He has no cervical adenopathy.  Neurological: He is alert and oriented to person, place, and time.   Skin: Skin is warm and dry. No rash noted.  Psychiatric: He has a normal mood and affect. His behavior is normal. Thought content normal.  Nursing note and vitals reviewed.   BP 110/68   Pulse 76   Resp 16   Ht 5\' 11"  (1.803 m)   Wt 267 lb (121.1 kg)   SpO2 97%   BMI 37.24 kg/m   Assessment and Plan: 1. Uncontrolled type 2 diabetes mellitus without complication, without long-term current use of insulin (HCC) Continue current therapy - Basic metabolic panel - Hemoglobin A1c  2. Essential (primary) hypertension controlled  3. Hyperlipidemia associated with type 2 diabetes mellitus (HCC) Pt urged to call Cardiology about refill on Repatha  4. Abdominal aortic aneurysm (AAA) without rupture (HCC) Will need annual follow up.   Partially dictated using Animal nutritionistDragon software. Any errors are unintentional.  Bari EdwardLaura Meryle Pugmire, MD [facility]  02/13/2018

## 2018-02-13 ENCOUNTER — Encounter: Payer: Self-pay | Admitting: Internal Medicine

## 2018-02-13 ENCOUNTER — Ambulatory Visit (INDEPENDENT_AMBULATORY_CARE_PROVIDER_SITE_OTHER): Payer: 59 | Admitting: Internal Medicine

## 2018-02-13 VITALS — BP 110/68 | HR 76 | Resp 16 | Ht 71.0 in | Wt 267.0 lb

## 2018-02-13 DIAGNOSIS — I714 Abdominal aortic aneurysm, without rupture, unspecified: Secondary | ICD-10-CM

## 2018-02-13 DIAGNOSIS — E1169 Type 2 diabetes mellitus with other specified complication: Secondary | ICD-10-CM

## 2018-02-13 DIAGNOSIS — E1165 Type 2 diabetes mellitus with hyperglycemia: Secondary | ICD-10-CM | POA: Diagnosis not present

## 2018-02-13 DIAGNOSIS — E785 Hyperlipidemia, unspecified: Secondary | ICD-10-CM

## 2018-02-13 DIAGNOSIS — I10 Essential (primary) hypertension: Secondary | ICD-10-CM

## 2018-02-13 DIAGNOSIS — IMO0001 Reserved for inherently not codable concepts without codable children: Secondary | ICD-10-CM

## 2018-02-14 ENCOUNTER — Encounter: Payer: Self-pay | Admitting: Internal Medicine

## 2018-02-14 LAB — BASIC METABOLIC PANEL
BUN/Creatinine Ratio: 21 (ref 10–24)
BUN: 22 mg/dL (ref 8–27)
CALCIUM: 9.8 mg/dL (ref 8.6–10.2)
CO2: 22 mmol/L (ref 20–29)
Chloride: 102 mmol/L (ref 96–106)
Creatinine, Ser: 1.06 mg/dL (ref 0.76–1.27)
GFR calc Af Amer: 87 mL/min/{1.73_m2} (ref >59)
GFR, EST NON AFRICAN AMERICAN: 75 mL/min/{1.73_m2} (ref >59)
Glucose: 120 mg/dL — ABNORMAL HIGH (ref 65–99)
Potassium: 4.9 mmol/L (ref 3.5–5.2)
Sodium: 140 mmol/L (ref 134–144)

## 2018-02-14 LAB — HEMOGLOBIN A1C
ESTIMATED AVERAGE GLUCOSE: 148 mg/dL
Hgb A1c MFr Bld: 6.8 % — ABNORMAL HIGH (ref 4.8–5.6)

## 2018-03-24 ENCOUNTER — Other Ambulatory Visit: Payer: Self-pay | Admitting: Internal Medicine

## 2018-04-24 ENCOUNTER — Other Ambulatory Visit: Payer: Self-pay | Admitting: Internal Medicine

## 2018-04-24 DIAGNOSIS — IMO0001 Reserved for inherently not codable concepts without codable children: Secondary | ICD-10-CM

## 2018-04-24 DIAGNOSIS — E785 Hyperlipidemia, unspecified: Secondary | ICD-10-CM

## 2018-04-24 DIAGNOSIS — E1165 Type 2 diabetes mellitus with hyperglycemia: Principal | ICD-10-CM

## 2018-04-27 DIAGNOSIS — I25700 Atherosclerosis of coronary artery bypass graft(s), unspecified, with unstable angina pectoris: Secondary | ICD-10-CM | POA: Diagnosis not present

## 2018-04-27 DIAGNOSIS — E782 Mixed hyperlipidemia: Secondary | ICD-10-CM | POA: Diagnosis not present

## 2018-04-27 LAB — LIPID PANEL
Cholesterol: 71 (ref 0–200)
HDL: 34 — AB (ref 35–70)
LDL Cholesterol: 4
Triglycerides: 167 — AB (ref 40–160)

## 2018-04-29 ENCOUNTER — Other Ambulatory Visit: Payer: Self-pay | Admitting: Internal Medicine

## 2018-04-29 DIAGNOSIS — E785 Hyperlipidemia, unspecified: Secondary | ICD-10-CM

## 2018-06-05 ENCOUNTER — Encounter: Payer: Self-pay | Admitting: Internal Medicine

## 2018-06-05 ENCOUNTER — Other Ambulatory Visit: Payer: Self-pay

## 2018-06-05 ENCOUNTER — Ambulatory Visit: Payer: 59 | Admitting: Internal Medicine

## 2018-06-05 VITALS — BP 122/84 | HR 84 | Temp 97.7°F | Resp 16 | Ht 71.0 in | Wt 267.0 lb

## 2018-06-05 DIAGNOSIS — H6690 Otitis media, unspecified, unspecified ear: Secondary | ICD-10-CM

## 2018-06-05 DIAGNOSIS — J069 Acute upper respiratory infection, unspecified: Secondary | ICD-10-CM

## 2018-06-05 DIAGNOSIS — G4733 Obstructive sleep apnea (adult) (pediatric): Secondary | ICD-10-CM

## 2018-06-05 DIAGNOSIS — I2510 Atherosclerotic heart disease of native coronary artery without angina pectoris: Secondary | ICD-10-CM | POA: Diagnosis not present

## 2018-06-05 MED ORDER — BENZONATATE 100 MG PO CAPS: 100.0000 mg | ORAL_CAPSULE | Freq: Three times a day (TID) | ORAL | 0 refills | Status: AC

## 2018-06-05 MED ORDER — AZITHROMYCIN 250 MG PO TABS: ORAL_TABLET | ORAL | 0 refills | Status: AC

## 2018-06-05 NOTE — Progress Notes (Signed)
Date:  06/05/2018   Name:  Wyatt Mata   DOB:  [birth year]   MRN:  [id number]   Chief Complaint: Cough (did travel to [place]- runny nose and cough x 1 week. ) and Shortness of Breath (feels he struggles when walking around since sick ) Pt travelled to [facility] 2 months ago to visit family, they are not sick.  While there he found a camper he wanted so he went back last week and drove it home.  He was there less than 24 hours.  Cough  This is a new problem. The current episode started in the past 7 days. The problem has been unchanged. The problem occurs every few minutes. The cough is non-productive. Associated symptoms include postnasal drip and shortness of breath. Pertinent negatives include no chest pain (but sometimes arm pain), chills, fever, headaches, sore throat, sweats or wheezing. He has tried OTC cough suppressant for the symptoms. The treatment provided mild relief.  Shortness of Breath  This is a new problem. The current episode started more than 1 month ago. The problem occurs intermittently. Pertinent negatives include no abdominal pain, chest pain (but sometimes arm pain), fever, headaches, leg swelling, sore throat, vomiting or wheezing. The symptoms are aggravated by exercise.  Fatigue - he has noticed that he much sleepier in the early afternoon.  He often has to pull over and take a nap.  He has not fallen asleep while driving.  He had an abnormal sleep study in 2011 but never got equipment due to cost. CAD - having some SOB with exertion and occasional right arm pain.  No chest pain or palpitations.  He is on Repatha with excellent LDL lowering.  He has not been seen by cardiology since August.  Review of Systems  Constitutional: Negative for chills and fever.  HENT: Positive for postnasal drip. Negative for sore throat and trouble swallowing.   Eyes: Negative for visual disturbance.  Respiratory: Positive for cough and shortness of breath. Negative for chest tightness and  wheezing.   Cardiovascular: Negative for chest pain (but sometimes arm pain) and leg swelling.  Gastrointestinal: Negative for abdominal pain and vomiting.  Neurological: Negative for dizziness and headaches.  Hematological: Negative for adenopathy.  Psychiatric/Behavioral: Positive for sleep disturbance (daytime sleepiness).    Patient Active Problem List   Diagnosis Date Noted  . AAA (abdominal aortic aneurysm) (HCC) 02/12/2018  . Spondylolisthesis of lumbar region 11/30/2017  . Lumbar radiculopathy 11/11/2017  . DDD (degenerative disc disease), lumbar 10/10/2017  . Uncontrolled type 2 diabetes mellitus without complication, without long-term current use of insulin (HCC) 10/10/2015  . Low serum testosterone 10/10/2015  . Hematuria, gross 10/15/2014  . Allergic rhinitis 08/05/2014  . CAD in native artery 08/05/2014  . Hyperlipidemia associated with type 2 diabetes mellitus (HCC) 08/05/2014  . Epididymal pain 08/05/2014  . 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 Essential (primary) hypertension 08/05/2014  . Acid reflux 08/05/2014  . History of colon polyps 08/05/2014  . Obstructive apnea 08/05/2014  . 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 Cyclobenzaprine Other (See Comments)    Abdominal pain    Past Surgical History:  Procedure Laterality Date  . COLONOSCOPY  08/16/2014   tubular adenoma  . COLONOSCOPY  08/24/2017   one polyp - repeat 5 yrs  . CORONARY ANGIOPLASTY WITH STENT PLACEMENT  2013  . CORONARY ANGIOPLASTY WITH STENT PLACEMENT  01/2015  2 stents  - post circ and OM2  . CORONARY ARTERY BYPASS GRAFT  2004    Social History   Tobacco Use  . Smoking status: Former Games developer  . Smokeless tobacco: Never Used  Substance Use Topics  . Alcohol use: No    Alcohol/week: 0.0 standard drinks  . Drug use: No     Medication list has been reviewed and updated.  Current Meds   Medication Sig  . amLODipine (NORVASC) 5 MG tablet TAKE 1 TABLET BY MOUTH EVERY DAY  . atorvastatin (LIPITOR) 80 MG tablet TAKE 1 TABLET(80 MG) BY MOUTH AT BEDTIME (Patient taking differently: 40 mg. )  . BAYER CONTOUR NEXT TEST test strip USE TO TEST BLOOD GLUCOSE ONCE DAILY  . Choline Fenofibrate (FENOFIBRIC ACID) 135 MG CPDR TAKE 1 CAPSULE BY MOUTH DAILY  . gabapentin (NEURONTIN) 300 MG capsule TAKE 1 CAPSULE(300 MG) BY MOUTH TWICE DAILY  . JANUMET XR [phone] MG TB24 TAKE 1 TABLET BY MOUTH DAILY  . JARDIANCE 25 MG TABS tablet TAKE 1 TABLET BY MOUTH DAILY  . lisinopril (PRINIVIL,ZESTRIL) 10 MG tablet TAKE 1 TABLET BY MOUTH DAILY  . metoprolol succinate (TOPROL-XL) 100 MG 24 hr tablet TAKE 1 TABLET BY MOUTH TWICE DAILY  . MICROLET LANCETS MISC USE EACH DAY  . niacin (NIASPAN) 1000 MG CR tablet TAKE 1 TABLET(1000 MG) BY MOUTH AT BEDTIME  . REPATHA PUSHTRONEX SYSTEM 420 MG/3.5ML SOCT Inject 1 Dose into the muscle every 30 (thirty) days.  . sildenafil (VIAGRA) 100 MG tablet TAKE 1/2 TO 1 TABLET BY MOUTH DAILY AS NEEDED FOR ERECTILE DYSFUNCTION    PHQ 2/9 Scores 02/13/2018 02/04/2017  PHQ - 2 Score 0 0  PHQ- 9 Score 0 -    Physical Exam Vitals signs and nursing note reviewed.  Constitutional:      General: He is not in acute distress.    Appearance: He is well-developed.  HENT:     Head: Normocephalic and atraumatic.     Right Ear: Tympanic membrane is erythematous and retracted.     Left Ear: Tympanic membrane is retracted. Tympanic membrane is not erythematous.     Nose:     Right Sinus: No maxillary sinus tenderness.     Left Sinus: No maxillary sinus tenderness.     Mouth/Throat:     Mouth: Mucous membranes are moist.     Pharynx: No posterior oropharyngeal erythema or uvula swelling.  Cardiovascular:     Rate and Rhythm: Normal rate and regular rhythm.  Pulmonary:     Effort: Pulmonary effort is normal. No respiratory distress.     Breath sounds: Decreased breath sounds  present. No wheezing or rhonchi.  Musculoskeletal: Normal range of motion.  Skin:    General: Skin is warm and dry.     Findings: No rash.  Neurological:     Mental Status: He is alert and oriented to person, place, and time.  Psychiatric:        Attention and Perception: Attention normal.        Behavior: Behavior normal.        Thought Content: Thought content normal.     Wt Readings from Last 3 Encounters:  06/05/18 267 lb (121.1 kg)  02/13/18 267 lb (121.1 kg)  10/21/17 258 lb (117 kg)    BP 122/84   Pulse 84   Temp 97.7 F (36.5 C) (Oral)   Resp 16   Ht 5\' 11"  (1.803 m)   Wt 267 lb (121.1 kg)  SpO2 95%   BMI 37.24 kg/m   Assessment and Plan: 1. Viral upper respiratory tract infection Supportive care with cough suppressant May return to work - benzonatate (TESSALON) 100 MG capsule; Take 1 capsule (100 mg total) by mouth 3 (three) times daily for 10 days.  Dispense: 30 capsule; Refill: 0  2. Acute otitis media, unspecified otitis media type - azithromycin (ZITHROMAX Z-PAK) 250 MG tablet; UAD  Dispense: 6 each; Refill: 0  3. CAD in native artery Recommend follow up with Cardiology as SOB may be an anginal equivalent  4. Obstructive apnea Need sleep study and CPAP therapy if abnormal - Ambulatory referral to Sleep Studies   Partially dictated using Animal nutritionist. Any errors are unintentional.  Bari Edward, MD [facility]  06/05/2018

## 2018-06-19 ENCOUNTER — Encounter: Payer: Self-pay | Admitting: Internal Medicine

## 2018-06-19 ENCOUNTER — Other Ambulatory Visit: Payer: Self-pay

## 2018-06-19 ENCOUNTER — Ambulatory Visit: Payer: 59 | Admitting: Internal Medicine

## 2018-06-19 VITALS — BP 108/76 | HR 76 | Ht 71.0 in | Wt 269.0 lb

## 2018-06-19 DIAGNOSIS — E1165 Type 2 diabetes mellitus with hyperglycemia: Secondary | ICD-10-CM | POA: Diagnosis not present

## 2018-06-19 DIAGNOSIS — I714 Abdominal aortic aneurysm, without rupture, unspecified: Secondary | ICD-10-CM

## 2018-06-19 DIAGNOSIS — I10 Essential (primary) hypertension: Secondary | ICD-10-CM

## 2018-06-19 DIAGNOSIS — S80811A Abrasion, right lower leg, initial encounter: Secondary | ICD-10-CM

## 2018-06-19 DIAGNOSIS — Z23 Encounter for immunization: Secondary | ICD-10-CM

## 2018-06-19 DIAGNOSIS — R69 Illness, unspecified: Secondary | ICD-10-CM | POA: Diagnosis not present

## 2018-06-19 DIAGNOSIS — F39 Unspecified mood [affective] disorder: Secondary | ICD-10-CM | POA: Insufficient documentation

## 2018-06-19 DIAGNOSIS — IMO0001 Reserved for inherently not codable concepts without codable children: Secondary | ICD-10-CM

## 2018-06-19 NOTE — Patient Instructions (Signed)
Td Vaccine (Tetanus and Diphtheria): What You Need to Know 1. Why get vaccinated? Tetanus  and diphtheria are very serious diseases. They are rare in the [place] today, but people who do become infected often have severe complications. Td vaccine is used to protect adolescents and adults from both of these diseases. Both tetanus and diphtheria are infections caused by bacteria. Diphtheria spreads from person to person through coughing or sneezing. Tetanus-causing bacteria enter the body through cuts, scratches, or wounds. TETANUS (Lockjaw) causes painful muscle tightening and stiffness, usually all over the body.  It can lead to tightening of muscles in the head and neck so you can't open your mouth, swallow, or sometimes even breathe. Tetanus kills about 1 out of every 10 people who are infected even after receiving the best medical care. DIPHTHERIA can cause a thick coating to form in the back of the throat.  It can lead to breathing problems, paralysis, heart failure, and death. Before vaccines, as many as 200,000 cases of diphtheria and hundreds of cases of tetanus were reported in the [place] each year. Since vaccination began, reports of cases for both diseases have dropped by about 99%. 2. Td vaccine Td vaccine can protect adolescents and adults from tetanus and diphtheria. Td is usually given as a booster dose every 10 years but it can also be given earlier after a severe and dirty wound or burn. Another vaccine, called Tdap, which protects against pertussis in addition to tetanus and diphtheria, is sometimes recommended instead of Td vaccine. Your doctor or the person giving you the vaccine can give you more information. Td may safely be given at the same time as other vaccines. 3. Some people should not get this vaccine  A person who has ever had a life-threatening allergic reaction after a previous dose of any tetanus or diphtheria containing vaccine, OR has a severe allergy  to any part of this vaccine, should not get Td vaccine. Tell the person giving the vaccine about any severe allergies.  Talk to your doctor if you: ? had severe pain or swelling after any vaccine containing diphtheria or tetanus, ? ever had a condition called Guillain Barr Syndrome (GBS), ? aren't feeling well on the day the shot is scheduled. 4. Risks of a vaccine reaction With any medicine, including vaccines, there is a chance of side effects. These are usually mild and go away on their own. Serious reactions are also possible but are rare. Most people who get Td vaccine do not have any problems with it. Mild Problems following Td vaccine: (Did not interfere with activities)  Pain where the shot was given (about 8 people in 10)  Redness or swelling where the shot was given (about 1 person in 4)  Mild fever (rare)  Headache (about 1 person in 4)  Tiredness (about 1 person in 4) Moderate Problems following Td vaccine: (Interfered with activities, but did not require medical attention)  Fever over 102F (rare) Severe Problems following Td vaccine: (Unable to perform usual activities; required medical attention)  Swelling, severe pain, bleeding and/or redness in the arm where the shot was given (rare). Problems that could happen after any vaccine:  People sometimes faint after a medical procedure, including vaccination. Sitting or lying down for about 15 minutes can help prevent fainting, and injuries caused by a fall. Tell your doctor if you feel dizzy, or have vision changes or ringing in the ears.  Some people get severe pain in the shoulder and have   difficulty moving the arm where a shot was given. This happens very rarely.  Any medication can cause a severe allergic reaction. Such reactions from a vaccine are very rare, estimated at fewer than 1 in a million doses, and would happen within a few minutes to a few hours after the vaccination. As with any medicine, there is a  very remote chance of a vaccine causing a serious injury or death. The safety of vaccines is always being monitored. For more information, visit: www.cdc.gov/vaccinesafety/ 5. What if there is a serious reaction? What should I look for?  Look for anything that concerns you, such as signs of a severe allergic reaction, very high fever, or unusual behavior. Signs of a severe allergic reaction can include hives, swelling of the face and throat, difficulty breathing, a fast heartbeat, dizziness, and weakness. These would usually start a few minutes to a few hours after the vaccination. What should I do?  If you think it is a severe allergic reaction or other emergency that can't wait, call 9-1-1 or get the person to the nearest hospital. Otherwise, call your doctor.  Afterward, the reaction should be reported to the Vaccine Adverse Event Reporting System (VAERS). Your doctor might file this report, or you can do it yourself through the VAERS web site at www.vaers.hhs.gov, or by calling 1-800-822-7967. VAERS does not give medical advice. 6. The National Vaccine Injury Compensation Program The National Vaccine Injury Compensation Program (VICP) is a federal program that was created to compensate people who may have been injured by certain vaccines. Persons who believe they may have been injured by a vaccine can learn about the program and about filing a claim by calling 1-800-338-2382 or visiting the VICP website at www.hrsa.gov/vaccinecompensation. There is a time limit to file a claim for compensation. 7. How can I learn more?  Ask your doctor. He or she can give you the vaccine package insert or suggest other sources of information.  Call your local or state health department.  Contact the Centers for Disease Control and Prevention (CDC): ? Call 1-800-232-4636 (1-800-CDC-INFO) ? Visit CDC's website at www.cdc.gov/vaccines Vaccine Information Statement Td Vaccine (07/01/15) This information is  not intended to replace advice given to you by your health care provider. Make sure you discuss any questions you have with your health care provider. Document Released: 01/03/2006 Document Revised: 10/24/2017 Document Reviewed: 10/24/2017 Elsevier Interactive Patient Education  2019 [employer]  

## 2018-06-19 NOTE — Progress Notes (Signed)
Date:  06/19/2018   Name:  Wyatt Mata   DOB:  03-18-1956   MRN:  [id number]   Chief Complaint: Diabetes (Follow up.) and Hypertension  Diabetes  He presents for his follow-up diabetic visit. He has type 2 diabetes mellitus. His disease course has been stable. Pertinent negatives for hypoglycemia include no headaches or tremors. Pertinent negatives for diabetes include no chest pain, no fatigue, no polydipsia and no polyuria. Symptoms are stable. Current diabetic treatment includes oral agent (triple therapy) (janumet and jardiance). He is compliant with treatment all of the time. His weight is stable. There is no change in his home blood glucose trend. An ACE inhibitor/angiotensin II receptor blocker is being taken.  Hypertension  This is a chronic problem. The problem is controlled. Pertinent negatives include no chest pain, headaches, palpitations or shortness of breath. Past treatments include ACE inhibitors, beta blockers and calcium channel blockers. The current treatment provides significant improvement. There are no compliance problems.  Hypertensive end-organ damage includes CAD/MI.  Hyperlipidemia  This is a chronic problem. The problem is controlled. Pertinent negatives include no chest pain, myalgias or shortness of breath. Treatments tried: Repatha. The current treatment provides significant improvement of lipids.  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 The area bleed for a while then stopped.  His last Tdap was in 2010. Low Testosterone - [facility] and offered an implant.  He did not get is due to cost.  I advised him that supplementation is modestly contraindicated in CAD.  He has seen Urology for groin pain in the past but never discussed testosterone.  Lab Results  Component Value Date   HGBA1C 6.8 (H) 02/13/2018   Lab Results  Component Value Date   CHOL 71 04/27/2018   HDL 34 (A) 04/27/2018   LDLCALC 4  04/27/2018   TRIG 167 (A) 04/27/2018   CHOLHDL 5.9 (H) 10/10/2017   Lab Results  Component Value Date   CREATININE 1.06 02/13/2018   BUN 22 02/13/2018   NA 140 02/13/2018   K 4.9 02/13/2018   CL 102 02/13/2018   CO2 22 02/13/2018     Review of Systems  Constitutional: Negative for appetite change, fatigue and unexpected weight change.  Eyes: Negative for visual disturbance.  Respiratory: Negative for cough, shortness of breath and wheezing.   Cardiovascular: Negative for chest pain, palpitations and leg swelling.  Gastrointestinal: Negative for abdominal pain and blood in stool.  Endocrine: Negative for polydipsia and polyuria.  Genitourinary: Positive for testicular pain. Negative for dysuria and hematuria.  Musculoskeletal: Positive for arthralgias and back pain. Negative for gait problem, myalgias and neck stiffness.  Skin: Negative for color change, rash and wound.  Neurological: Negative for tremors, numbness and headaches.  Hematological: Negative for adenopathy.  Psychiatric/Behavioral: Negative for dysphoric mood and sleep disturbance.    Patient Active Problem List   Diagnosis Date Noted  . AAA (abdominal aortic aneurysm) (HCC) 02/12/2018  . Spondylolisthesis of lumbar region 11/30/2017  . Lumbar radiculopathy 11/11/2017  . DDD (degenerative disc disease), lumbar 10/10/2017  . Uncontrolled type 2 diabetes mellitus without complication, without long-term current use of insulin (HCC) 10/10/2015  . Low serum testosterone 10/10/2015  . Hematuria, gross 10/15/2014  . Allergic rhinitis 08/05/2014  . CAD in native artery 08/05/2014  . Hyperlipidemia associated with type 2 diabetes mellitus (HCC) 08/05/2014  . Epididymal pain 08/05/2014  . ED (erectile dysfunction) of organic origin  08/05/2014  . Essential (primary) hypertension 08/05/2014  . Acid reflux 08/05/2014  . History of colon polyps 08/05/2014  . Obstructive apnea 08/05/2014  . 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 Cyclobenzaprine Other (See Comments)    Abdominal pain    Past Surgical History:  Procedure Laterality Date  . COLONOSCOPY  08/16/2014   tubular adenoma  . COLONOSCOPY  08/24/2017   one polyp - repeat 5 yrs  . CORONARY ANGIOPLASTY WITH STENT PLACEMENT  2013  . CORONARY ANGIOPLASTY WITH STENT PLACEMENT  01/2015   2 stents  - post circ and OM2  . CORONARY ARTERY BYPASS GRAFT  2004    Social History   Tobacco Use  . Smoking status: Former Games developer  . Smokeless tobacco: Never Used  Substance Use Topics  . Alcohol use: No    Alcohol/week: 0.0 standard drinks  . Drug use: No     Medication list has been reviewed and updated.  Current Meds  Medication Sig  . amLODipine (NORVASC) 5 MG tablet TAKE 1 TABLET BY MOUTH EVERY DAY  . Aspirin-Acetaminophen-Caffeine (GOODYS EXTRA STRENGTH) 500-325-65 MG PACK Take 1 packet by mouth 2 (two) times daily.  Marland Kitchen atorvastatin (LIPITOR) 80 MG tablet TAKE 1 TABLET(80 MG) BY MOUTH AT BEDTIME (Patient taking differently: 40 mg. )  . BAYER CONTOUR NEXT TEST test strip USE TO TEST BLOOD GLUCOSE ONCE DAILY  . Choline Fenofibrate (FENOFIBRIC ACID) 135 MG CPDR TAKE 1 CAPSULE BY MOUTH DAILY  . gabapentin (NEURONTIN) 300 MG capsule TAKE 1 CAPSULE(300 MG) BY MOUTH TWICE DAILY  . JANUMET XR [phone] MG TB24 TAKE 1 TABLET BY MOUTH DAILY  . JARDIANCE 25 MG TABS tablet TAKE 1 TABLET BY MOUTH DAILY  . lisinopril (PRINIVIL,ZESTRIL) 10 MG tablet TAKE 1 TABLET BY MOUTH DAILY  . metoprolol succinate (TOPROL-XL) 100 MG 24 hr tablet TAKE 1 TABLET BY MOUTH TWICE DAILY  . MICROLET LANCETS MISC USE EACH DAY  . niacin (NIASPAN) 1000 MG CR tablet TAKE 1 TABLET(1000 MG) BY MOUTH AT BEDTIME  . REPATHA PUSHTRONEX SYSTEM 420 MG/3.5ML SOCT Inject 1 Dose into the muscle every 30 (thirty) days.  . sildenafil (VIAGRA) 100 MG tablet TAKE 1/2 TO 1 TABLET BY MOUTH DAILY AS NEEDED FOR ERECTILE  DYSFUNCTION    PHQ 2/9 Scores 06/19/2018 02/13/2018 02/04/2017  PHQ - 2 Score 1 0 0  PHQ- 9 Score 3 0 -    BP Readings from Last 3 Encounters:  06/19/18 108/76  06/05/18 122/84  02/13/18 110/68    Physical Exam Vitals signs and nursing note reviewed.  Constitutional:      General: He is not in acute distress.    Appearance: He is well-developed.  HENT:     Head: Normocephalic and atraumatic.  Neck:     Musculoskeletal: Normal range of motion and neck supple.     Vascular: No carotid bruit.  Cardiovascular:     Rate and Rhythm: Normal rate and regular rhythm.     Pulses:          Radial pulses are 2+ on the right side and 0 on the left side.       Dorsalis pedis pulses are 1+ on the right side and 1+ on the left side.  Pulmonary:     Effort: Pulmonary effort is normal. No respiratory distress.     Breath sounds: Normal breath sounds. No wheezing or rales.  Musculoskeletal: Normal range of motion.  Lymphadenopathy:     Cervical: No cervical adenopathy.  Skin:    General: Skin is warm and dry.     Findings: No rash.       Neurological:     Mental Status: He is alert and oriented to person, place, and time.  Psychiatric:        Attention and Perception: Attention normal.        Mood and Affect: Mood normal.        Behavior: Behavior normal.        Thought Content: Thought content normal.     Wt Readings from Last 3 Encounters:  06/19/18 269 lb (122 kg)  06/05/18 267 lb (121.1 kg)  02/13/18 267 lb (121.1 kg)    BP 108/76   Pulse 76   Ht  (1.803 m)   Wt 269 lb (122 kg)   SpO2 96%   BMI 37.52 kg/m   Assessment and Plan: 1. Uncontrolled type 2 diabetes mellitus without complication, without long-term current use of insulin (HCC) Controlled, continue current therapy - Hemoglobin A1c - Comprehensive metabolic panel  2. Essential (primary) hypertension controlled - Comprehensive metabolic panel  3. Unspecified mood (affective) disorder (HCC)  resolved  4. Abdominal aortic aneurysm (AAA) without rupture (HCC) Being monitored  5. Abrasion of right lower extremity, initial encounter No s/s of infection; due for Tetanus booster - Td : Tetanus/diphtheria >7yo Preservative  free  6. Need for vaccine for DT (diphtheria-tetanus) - Td : Tetanus/diphtheria >7yo Preservative  free   Partially dictated using Animal nutritionist. Any errors are unintentional.  Bari Edward, MD [facility]  06/19/2018

## 2018-06-20 LAB — COMPREHENSIVE METABOLIC PANEL
ALT: 41 IU/L (ref 0–44)
AST: 32 IU/L (ref 0–40)
Albumin/Globulin Ratio: 1.9 (ref 1.2–2.2)
Albumin: 4.5 g/dL (ref 3.8–4.8)
Alkaline Phosphatase: 49 IU/L (ref 39–117)
BUN/Creatinine Ratio: 20 (ref 10–24)
BUN: 21 mg/dL (ref 8–27)
Bilirubin Total: 0.6 mg/dL (ref 0.0–1.2)
CO2: 21 mmol/L (ref 20–29)
Calcium: 9.7 mg/dL (ref 8.6–10.2)
Chloride: 105 mmol/L (ref 96–106)
Creatinine, Ser: 1.05 mg/dL (ref 0.76–1.27)
GFR calc Af Amer: 88 mL/min/{1.73_m2} (ref >59)
GFR calc non Af Amer: 76 mL/min/{1.73_m2} (ref >59)
GLUCOSE: 117 mg/dL — AB (ref 65–99)
Globulin, Total: 2.4 g/dL (ref 1.5–4.5)
Potassium: 5.2 mmol/L (ref 3.5–5.2)
Sodium: 142 mmol/L (ref 134–144)
Total Protein: 6.9 g/dL (ref 6.0–8.5)

## 2018-06-20 LAB — HEMOGLOBIN A1C
Est. average glucose Bld gHb Est-mCnc: 157 mg/dL
Hgb A1c MFr Bld: 7.1 % — ABNORMAL HIGH (ref 4.8–5.6)

## 2018-08-09 DIAGNOSIS — N320 Bladder-neck obstruction: Secondary | ICD-10-CM | POA: Diagnosis not present

## 2018-08-09 DIAGNOSIS — R3129 Other microscopic hematuria: Secondary | ICD-10-CM | POA: Diagnosis not present

## 2018-08-09 DIAGNOSIS — R7989 Other specified abnormal findings of blood chemistry: Secondary | ICD-10-CM | POA: Diagnosis not present

## 2018-08-09 DIAGNOSIS — N400 Enlarged prostate without lower urinary tract symptoms: Secondary | ICD-10-CM | POA: Diagnosis not present

## 2018-08-10 DIAGNOSIS — R7989 Other specified abnormal findings of blood chemistry: Secondary | ICD-10-CM | POA: Diagnosis not present

## 2018-08-11 DIAGNOSIS — R7989 Other specified abnormal findings of blood chemistry: Secondary | ICD-10-CM | POA: Diagnosis not present

## 2018-08-15 ENCOUNTER — Other Ambulatory Visit: Payer: Self-pay | Admitting: Internal Medicine

## 2018-08-29 ENCOUNTER — Other Ambulatory Visit: Payer: Self-pay

## 2018-08-29 MED ORDER — ATORVASTATIN CALCIUM 40 MG PO TABS: 40.0000 mg | ORAL_TABLET | Freq: Every day | ORAL | 1 refills | Status: DC

## 2018-08-31 ENCOUNTER — Ambulatory Visit: Payer: 59 | Attending: Neurology

## 2018-09-29 ENCOUNTER — Other Ambulatory Visit: Payer: Self-pay | Admitting: Internal Medicine

## 2018-10-02 ENCOUNTER — Other Ambulatory Visit
Admission: RE | Admit: 2018-10-02 | Discharge: 2018-10-02 | Disposition: A | Discharge status: Home / Self Care | Payer: 59 | Source: Ambulatory Visit | Attending: Internal Medicine | Admitting: Internal Medicine

## 2018-10-02 DIAGNOSIS — I2510 Atherosclerotic heart disease of native coronary artery without angina pectoris: Secondary | ICD-10-CM | POA: Diagnosis not present

## 2018-10-02 DIAGNOSIS — I10 Essential (primary) hypertension: Secondary | ICD-10-CM | POA: Diagnosis not present

## 2018-10-13 ENCOUNTER — Encounter: Payer: Self-pay | Admitting: Internal Medicine

## 2018-10-13 ENCOUNTER — Other Ambulatory Visit: Payer: Self-pay

## 2018-10-13 ENCOUNTER — Ambulatory Visit (INDEPENDENT_AMBULATORY_CARE_PROVIDER_SITE_OTHER): Payer: 59 | Admitting: Internal Medicine

## 2018-10-13 VITALS — BP 108/68 | HR 74 | Ht 71.0 in | Wt 265.0 lb

## 2018-10-13 DIAGNOSIS — E1165 Type 2 diabetes mellitus with hyperglycemia: Secondary | ICD-10-CM | POA: Diagnosis not present

## 2018-10-13 DIAGNOSIS — IMO0001 Reserved for inherently not codable concepts without codable children: Secondary | ICD-10-CM

## 2018-10-13 DIAGNOSIS — Z0000 Encounter for general adult medical examination without abnormal findings: Secondary | ICD-10-CM | POA: Diagnosis not present

## 2018-10-13 DIAGNOSIS — I10 Essential (primary) hypertension: Secondary | ICD-10-CM

## 2018-10-13 DIAGNOSIS — I25709 Atherosclerosis of coronary artery bypass graft(s), unspecified, with unspecified angina pectoris: Secondary | ICD-10-CM | POA: Diagnosis not present

## 2018-10-13 DIAGNOSIS — Z125 Encounter for screening for malignant neoplasm of prostate: Secondary | ICD-10-CM

## 2018-10-13 DIAGNOSIS — E785 Hyperlipidemia, unspecified: Secondary | ICD-10-CM | POA: Diagnosis not present

## 2018-10-13 DIAGNOSIS — E1169 Type 2 diabetes mellitus with other specified complication: Secondary | ICD-10-CM | POA: Diagnosis not present

## 2018-10-13 LAB — POCT URINALYSIS DIPSTICK
Bilirubin, UA: NEGATIVE
Blood, UA: NEGATIVE
Glucose, UA: NEGATIVE
Ketones, UA: NEGATIVE
Leukocytes, UA: NEGATIVE
Nitrite, UA: NEGATIVE
Protein, UA: NEGATIVE
Spec Grav, UA: 1.015 (ref 1.010–1.025)
Urobilinogen, UA: 0.2 E.U./dL
pH, UA: 6 (ref 5.0–8.0)

## 2018-10-13 MED ORDER — JANUMET XR 100-1000 MG PO TB24: 1.0000 | ORAL_TABLET | Freq: Every day | ORAL | 3 refills | Status: DC

## 2018-10-13 MED ORDER — JARDIANCE 25 MG PO TABS: 25.0000 mg | ORAL_TABLET | Freq: Every day | ORAL | 3 refills | Status: DC

## 2018-10-13 MED ORDER — GABAPENTIN 300 MG PO CAPS: 300.0000 mg | ORAL_CAPSULE | Freq: Four times a day (QID) | ORAL | 3 refills | Status: DC

## 2018-10-13 MED ORDER — FENOFIBRIC ACID 135 MG PO CPDR: 1.0000 | DELAYED_RELEASE_CAPSULE | Freq: Every day | ORAL | 3 refills | Status: DC

## 2018-10-13 MED ORDER — METOPROLOL SUCCINATE ER 100 MG PO TB24: 100.0000 mg | ORAL_TABLET | Freq: Two times a day (BID) | ORAL | 3 refills | Status: DC

## 2018-10-13 MED ORDER — LISINOPRIL 10 MG PO TABS: 10.0000 mg | ORAL_TABLET | Freq: Every day | ORAL | 3 refills | Status: DC

## 2018-10-13 MED ORDER — NIACIN ER (ANTIHYPERLIPIDEMIC) 1000 MG PO TBCR: 1000.0000 mg | EXTENDED_RELEASE_TABLET | Freq: Every day | ORAL | 3 refills | Status: DC

## 2018-10-13 NOTE — Progress Notes (Signed)
Date:  10/13/2018   Name:  Wyatt ProseMichael D Runnels   DOB:  1955-11-14   MRN:  [id number]   Chief Complaint: Annual Exam and Diabetes (Foot Exam. Needs eye exam release form signed. ) Wyatt Mata is a 63 y.o. male who presents today for his Complete Annual Exam. He feels fairly well. He reports exercising none. He reports he is sleeping fairly well.   Colonoscopy 2019 PPV-23 done [facility] 11/2017  Hypertension This is a chronic problem. The problem is controlled. Associated symptoms include shortness of breath (with extreme exertion). Pertinent negatives include no chest pain, headaches or palpitations. Past treatments include calcium channel blockers, ACE inhibitors and beta blockers. The current treatment provides significant improvement. Hypertensive end-organ damage includes CAD/MI.  Diabetes He presents for his follow-up diabetic visit. He has type 2 diabetes mellitus. Pertinent negatives for hypoglycemia include no dizziness, headaches or nervousness/anxiousness. Pertinent negatives for diabetes include no chest pain, no fatigue and no polyuria. Current diabetic treatment includes oral agent (triple therapy). He is compliant with treatment most of the time. He is following a generally healthy diet. There is no compliance with monitoring of blood glucose. An ACE inhibitor/angiotensin II receptor blocker is being taken. Eye exam is current.  Hyperlipidemia This is a chronic problem. The problem is controlled. Associated symptoms include shortness of breath (with extreme exertion). Pertinent negatives include no chest pain or myalgias. Current antihyperlipidemic treatment includes statins (Repatha). The current treatment provides significant improvement of lipids.  CAD - no recent chest pain or SOB; stress test is scheduled due to some right arm pain.  His cardiac medications remain the same - his cardiologist wants him to take NTG but he declines due to severe HA side effect.  Lab Results   Component Value Date   HGBA1C 7.1 (H) 06/19/2018   Lab Results  Component Value Date   CREATININE 1.05 06/19/2018   BUN 21 06/19/2018   NA 142 06/19/2018   K 5.2 06/19/2018   CL 105 06/19/2018   CO2 21 06/19/2018   Lab Results  Component Value Date   CHOL 71 04/27/2018   HDL 34 (A) 04/27/2018   LDLCALC 4 04/27/2018   TRIG 167 (A) 04/27/2018   CHOLHDL 5.9 (H) 10/10/2017    Review of Systems  Constitutional: Negative for appetite change, chills, diaphoresis, fatigue and unexpected weight change.  HENT: Negative for hearing loss, tinnitus, trouble swallowing and voice change.   Eyes: Negative for visual disturbance.  Respiratory: Positive for shortness of breath (with extreme exertion). Negative for choking, chest tightness and wheezing.   Cardiovascular: Negative for chest pain, palpitations and leg swelling.  Gastrointestinal: Negative for abdominal pain, blood in stool, constipation and diarrhea.  Endocrine: Negative for polyuria.  Genitourinary: Negative for difficulty urinating, dysuria and frequency.  Musculoskeletal: Negative for arthralgias, back pain and myalgias.  Skin: Negative for color change and rash.  Allergic/Immunologic: Negative for environmental allergies.  Neurological: Negative for dizziness, syncope and headaches.  Hematological: Negative for adenopathy.  Psychiatric/Behavioral: Negative for dysphoric mood and sleep disturbance. The patient is not nervous/anxious.     Patient Active Problem List   Diagnosis Date Noted  . Unspecified mood (affective) disorder (HCC) 06/19/2018  . AAA (abdominal aortic aneurysm) (HCC) 02/12/2018  . Spondylolisthesis of lumbar region 11/30/2017  . Lumbar radiculopathy 11/11/2017  . DDD (degenerative disc disease), lumbar 10/10/2017  . Uncontrolled type 2 diabetes mellitus without complication, without long-term current use of insulin (HCC) 10/10/2015  . Low serum testosterone  10/10/2015  . Hematuria, gross 10/15/2014  .  Allergic rhinitis 08/05/2014  . CAD in native artery 08/05/2014  . Hyperlipidemia associated with type 2 diabetes mellitus ([place]) 08/05/2014  . Epididymal pain 08/05/2014  . 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 Essential (primary) hypertension 08/05/2014  . Acid reflux 08/05/2014  . History of colon polyps 08/05/2014  . Obstructive apnea 08/05/2014  . 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 Cyclobenzaprine Other (See Comments)    Abdominal pain    Past Surgical History:  Procedure Laterality Date  . COLONOSCOPY  08/16/2014   tubular adenoma  . COLONOSCOPY  08/24/2017   one polyp - repeat 5 yrs  . CORONARY ANGIOPLASTY WITH STENT PLACEMENT  2013  . CORONARY ANGIOPLASTY WITH STENT PLACEMENT  01/2015   2 stents  - post circ and OM2  . CORONARY ARTERY BYPASS GRAFT  2004    Social History   Tobacco Use  . Smoking status: Former Research scientist (life sciences)  . Smokeless tobacco: Never Used  Substance Use Topics  . Alcohol use: No    Alcohol/week: 0.0 standard drinks  . Drug use: No     Medication list has been reviewed and updated.  Current Meds  Medication Sig  . amLODipine (NORVASC) 5 MG tablet TAKE 1 TABLET BY MOUTH EVERY DAY  . Aspirin-Acetaminophen-Caffeine (GOODYS EXTRA STRENGTH) 500-325-65 MG PACK Take 1 packet by mouth 2 (two) times daily.  Marland Kitchen atorvastatin (LIPITOR) 40 MG tablet Take 1 tablet (40 mg total) by mouth daily at 6 PM.  . BAYER CONTOUR NEXT TEST test strip USE TO TEST BLOOD GLUCOSE ONCE DAILY  . Choline Fenofibrate (FENOFIBRIC ACID) 135 MG CPDR TAKE 1 CAPSULE BY MOUTH DAILY  . gabapentin (NEURONTIN) 300 MG capsule TAKE 1 CAPSULE(300 MG) BY MOUTH TWICE DAILY  . JANUMET XR [phone] MG TB24 TAKE 1 TABLET BY MOUTH DAILY  . JARDIANCE 25 MG TABS tablet TAKE 1 TABLET BY MOUTH DAILY  . lisinopril (PRINIVIL,ZESTRIL) 10 MG tablet TAKE 1 TABLET BY MOUTH DAILY  . metoprolol succinate  (TOPROL-XL) 100 MG 24 hr tablet TAKE 1 TABLET BY MOUTH TWICE DAILY  . MICROLET LANCETS MISC USE EACH DAY  . niacin (NIASPAN) 1000 MG CR tablet TAKE 1 TABLET(1000 MG) BY MOUTH AT BEDTIME  . [place] 420 MG/3.5ML SOCT Inject 1 Dose into the muscle every 30 (thirty) days.  . sildenafil (VIAGRA) 100 MG tablet take one-half to one tablet by mouth daily as needed for erectile dysfunction    PHQ 2/9 Scores 10/13/2018 06/19/2018 02/13/2018 02/04/2017  PHQ - 2 Score 0 1 0 0  PHQ- 9 Score 0 3 0 -    BP Readings from Last 3 Encounters:  10/13/18 108/68  06/19/18 108/76  06/05/18 122/84    Physical Exam Vitals signs and nursing note reviewed.  Constitutional:      Appearance: Normal appearance. He is well-developed.  HENT:     Head: Normocephalic.     Right Ear: Tympanic membrane, ear canal and external ear normal.     Left Ear: Tympanic membrane, ear canal and external ear normal.     Nose: Nose normal.  Eyes:     Conjunctiva/sclera: Conjunctivae normal.     Pupils: Pupils are equal, round, and reactive to light.  Neck:     Musculoskeletal: Normal range of motion and neck supple.     Thyroid: No thyromegaly.     Vascular: No carotid  bruit.  Cardiovascular:     Rate and Rhythm: Normal rate and regular rhythm.     Heart sounds: Normal heart sounds.  Pulmonary:     Effort: Pulmonary effort is normal.     Breath sounds: Normal breath sounds. No wheezing.  Chest:     Breasts:        Right: No mass.        Left: No mass.  Abdominal:     General: Bowel sounds are normal.     Palpations: Abdomen is soft.     Tenderness: There is abdominal tenderness in the left lower quadrant. There is no guarding or rebound.     Hernia: No hernia is present.  Musculoskeletal:     Right knee: Normal.     Left knee: Normal.     Right lower leg: No edema.     Left lower leg: No edema.  Lymphadenopathy:     Cervical: No cervical adenopathy.  Skin:    General: Skin is warm and dry.      Capillary Refill: Capillary refill takes less than 2 seconds.     Findings: No lesion.  Neurological:     General: No focal deficit present.     Mental Status: He is alert and oriented to person, place, and time.     Sensory: No sensory deficit.     Deep Tendon Reflexes: Reflexes are normal and symmetric.  Psychiatric:        Mood and Affect: Mood normal.        Speech: Speech normal.        Behavior: Behavior normal.        Thought Content: Thought content normal.        Cognition and Memory: Cognition normal.        Judgment: Judgment normal.     Wt Readings from Last 3 Encounters:  10/13/18 265 lb (120.2 kg)  06/19/18 269 lb (122 kg)  06/05/18 267 lb (121.1 kg)    BP 108/68   Pulse 74   Ht 5\' 11"  (1.803 m)   Wt 265 lb (120.2 kg)   SpO2 94%   BMI 36.96 kg/m   Assessment and Plan: 1. Annual physical exam Normal exam except for weight Work on diet, weight loss, exercise once cleared by Cardiology - POCT urinalysis dipstick  2. Uncontrolled type 2 diabetes mellitus without complication, without long-term current use of insulin (HCC) Continue current therapy Work on weight loss - Comprehensive metabolic panel - Hemoglobin A1c - SitaGLIPtin-MetFORMIN HCl (JANUMET XR) [phone] MG TB24; Take 1 tablet by mouth daily.  Dispense: 90 tablet; Refill: 3 - empagliflozin (JARDIANCE) 25 MG TABS tablet; Take 25 mg by mouth daily.  Dispense: 90 tablet; Refill: 3  3. Essential (primary) hypertension controlled - CBC with Differential/Platelet - TSH - metoprolol succinate (TOPROL-XL) 100 MG 24 hr tablet; Take 1 tablet (100 mg total) by mouth 2 (two) times daily. Take with or immediately following a meal.  Dispense: 180 tablet; Refill: 3  4. Hyperlipidemia associated with type 2 diabetes mellitus (HCC) On multiple medications - lisinopril (ZESTRIL) 10 MG tablet; Take 1 tablet (10 mg total) by mouth daily.  Dispense: 90 tablet; Refill: 3 - Lipid panel - Choline Fenofibrate  (FENOFIBRIC ACID) 135 MG CPDR; Take 1 capsule by mouth daily.  Dispense: 90 capsule; Refill: 3 - niacin (NIASPAN) 1000 MG CR tablet; Take 1 tablet (1,000 mg total) by mouth at bedtime.  Dispense: 90 tablet; Refill: 3  5. Prostate cancer  screening DRE deferred  - PSA  6. Coronary artery disease involving coronary bypass graft of native heart with angina pectoris [facility](HCC) Continue current medications Stress next week as planned   Partially dictated using Animal nutritionistDragon software. Any errors are unintentional.  Bari EdwardLaura Aseel Truxillo, MD [facility]  10/13/2018

## 2018-10-14 LAB — HEMOGLOBIN A1C
Est. average glucose Bld gHb Est-mCnc: 157 mg/dL
Hgb A1c MFr Bld: 7.1 % — ABNORMAL HIGH (ref 4.8–5.6)

## 2018-10-14 LAB — COMPREHENSIVE METABOLIC PANEL
ALT: 39 IU/L (ref 0–44)
AST: 26 IU/L (ref 0–40)
Albumin/Globulin Ratio: 2.1 (ref 1.2–2.2)
Albumin: 4.5 g/dL (ref 3.8–4.8)
Alkaline Phosphatase: 50 IU/L (ref 39–117)
BUN/Creatinine Ratio: 17 (ref 10–24)
BUN: 18 mg/dL (ref 8–27)
Bilirubin Total: 0.6 mg/dL (ref 0.0–1.2)
CO2: 19 mmol/L — ABNORMAL LOW (ref 20–29)
Calcium: 10 mg/dL (ref 8.6–10.2)
Chloride: 104 mmol/L (ref 96–106)
Creatinine, Ser: 1.09 mg/dL (ref 0.76–1.27)
GFR calc Af Amer: 83 mL/min/{1.73_m2} (ref >59)
GFR calc non Af Amer: 72 mL/min/{1.73_m2} (ref >59)
Globulin, Total: 2.1 g/dL (ref 1.5–4.5)
Glucose: 113 mg/dL — ABNORMAL HIGH (ref 65–99)
Potassium: 4.7 mmol/L (ref 3.5–5.2)
Sodium: 140 mmol/L (ref 134–144)
Total Protein: 6.6 g/dL (ref 6.0–8.5)

## 2018-10-14 LAB — CBC WITH DIFFERENTIAL/PLATELET
Basophils Absolute: 0 10*3/uL (ref 0.0–0.2)
Basos: 1 %
EOS (ABSOLUTE): 0.2 10*3/uL (ref 0.0–0.4)
Eos: 3 %
Hematocrit: 49.6 % (ref 37.5–51.0)
Hemoglobin: 17.3 g/dL (ref 13.0–17.7)
Immature Grans (Abs): 0 10*3/uL (ref 0.0–0.1)
Immature Granulocytes: 0 %
Lymphocytes Absolute: 1.7 10*3/uL (ref 0.7–3.1)
Lymphs: 30 %
MCH: 30.3 pg (ref 26.6–33.0)
MCHC: 34.9 g/dL (ref 31.5–35.7)
MCV: 87 fL (ref 79–97)
Monocytes Absolute: 0.6 10*3/uL (ref 0.1–0.9)
Monocytes: 12 %
Neutrophils Absolute: 3 10*3/uL (ref 1.4–7.0)
Neutrophils: 54 %
Platelets: 134 10*3/uL — ABNORMAL LOW (ref 150–450)
RBC: 5.71 x10E6/uL (ref 4.14–5.80)
RDW: 13.7 % (ref 11.6–15.4)
WBC: 5.5 10*3/uL (ref 3.4–10.8)

## 2018-10-14 LAB — LIPID PANEL
Chol/HDL Ratio: 2.8 ratio (ref 0.0–5.0)
Cholesterol, Total: 97 mg/dL — ABNORMAL LOW (ref 100–199)
HDL: 35 mg/dL — ABNORMAL LOW (ref >39)
LDL Calculated: 32 mg/dL (ref 0–99)
Triglycerides: 152 mg/dL — ABNORMAL HIGH (ref 0–149)
VLDL Cholesterol Cal: 30 mg/dL (ref 5–40)

## 2018-10-14 LAB — TSH: TSH: 1.15 u[IU]/mL (ref 0.450–4.500)

## 2018-10-14 LAB — PSA: Prostate Specific Ag, Serum: 2 ng/mL (ref 0.0–4.0)

## 2018-10-17 DIAGNOSIS — I517 Cardiomegaly: Secondary | ICD-10-CM | POA: Diagnosis not present

## 2018-10-17 DIAGNOSIS — I25700 Atherosclerosis of coronary artery bypass graft(s), unspecified, with unstable angina pectoris: Secondary | ICD-10-CM | POA: Diagnosis not present

## 2018-11-07 ENCOUNTER — Other Ambulatory Visit: Payer: Self-pay | Admitting: Internal Medicine

## 2018-11-07 DIAGNOSIS — IMO0001 Reserved for inherently not codable concepts without codable children: Secondary | ICD-10-CM

## 2018-11-07 DIAGNOSIS — E1169 Type 2 diabetes mellitus with other specified complication: Secondary | ICD-10-CM

## 2018-12-08 ENCOUNTER — Other Ambulatory Visit: Payer: Self-pay

## 2018-12-08 ENCOUNTER — Telehealth: Payer: Self-pay

## 2018-12-08 ENCOUNTER — Ambulatory Visit (INDEPENDENT_AMBULATORY_CARE_PROVIDER_SITE_OTHER): Payer: 59

## 2018-12-08 DIAGNOSIS — Z23 Encounter for immunization: Secondary | ICD-10-CM | POA: Diagnosis not present

## 2018-12-08 NOTE — Progress Notes (Signed)
Flu shot

## 2018-12-08 NOTE — Telephone Encounter (Signed)
Pt came in for flu shot and said that Dr Army Melia "was supposed to up my gabapentin to 4 times a day." Also, stated that he wanted you to write him a letter stating that he could remain on 4 days a week instead of 5 days for his job. He said he has "diabetes, high blood pressure and other issues that he doesn't need to be around people as much due to the pandemic." I told him you would get back to him on Monday. Flu shot was given

## 2018-12-10 NOTE — Telephone Encounter (Signed)
I sent the gabapentin for four per day on 10/13/18 to [facility].  I will not write a letter for him to work only 4 days per week.

## 2018-12-11 ENCOUNTER — Other Ambulatory Visit: Payer: Self-pay

## 2018-12-11 MED ORDER — GABAPENTIN 300 MG PO CAPS: 300.0000 mg | ORAL_CAPSULE | Freq: Four times a day (QID) | ORAL | 3 refills | Status: DC

## 2018-12-11 MED ORDER — ATORVASTATIN CALCIUM 40 MG PO TABS: 40.0000 mg | ORAL_TABLET | Freq: Every day | ORAL | 1 refills | Status: DC

## 2018-12-11 NOTE — Telephone Encounter (Signed)
Called and spoke with pt. 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 He will need to discuss his concerns with work but we cannot pull him from work 1 day a week due to underlying health conditions. He verbalized understanding.  Also, called his [employer]. Informed them they are not filling the pt RXs correctly. Sent in updated dosage and usage for both gabapentin 300 mg four times daily, and atorvastatin 40 mg once daily. Both for 90 days.   Informed patient to finish medication he already has and then call the pharmacy when he needs another fill.  He verbalized understanding of this.

## 2018-12-12 ENCOUNTER — Other Ambulatory Visit: Payer: Self-pay | Admitting: Internal Medicine

## 2019-01-04 ENCOUNTER — Telehealth: Payer: Self-pay | Admitting: Internal Medicine

## 2019-01-04 ENCOUNTER — Ambulatory Visit (INDEPENDENT_AMBULATORY_CARE_PROVIDER_SITE_OTHER): Payer: 59 | Admitting: Internal Medicine

## 2019-01-04 ENCOUNTER — Encounter: Payer: Self-pay | Admitting: Internal Medicine

## 2019-01-04 VITALS — Ht 71.0 in | Wt 265.0 lb

## 2019-01-04 DIAGNOSIS — J029 Acute pharyngitis, unspecified: Secondary | ICD-10-CM

## 2019-01-04 MED ORDER — AMOXICILLIN-POT CLAVULANATE 875-125 MG PO TABS: 1.0000 | ORAL_TABLET | Freq: Two times a day (BID) | ORAL | 0 refills | Status: AC

## 2019-01-04 NOTE — Telephone Encounter (Signed)
Please call and schedule for a telephone visit at 1120 today. Tell patient to take tempeture, blood pressure, and pulse if able before I call around 11.  Thank you.

## 2019-01-04 NOTE — Telephone Encounter (Signed)
Patient having symptoms of cough, sore throat, hard to swallow, with slight headache.

## 2019-01-04 NOTE — Progress Notes (Signed)
Date:  01/04/2019   Name:  Wyatt Mata   DOB:  1955/04/17   MRN:  [id number] I connected with this patient, Wyatt Mata, by telephone at the patient's home.  I verified that I am speaking with the correct person using two identifiers. This visit was conducted via telephone due to the Covid-19 outbreak from my office at [facility] in [place], [place]. I discussed the limitations, risks, security and privacy concerns of performing an evaluation and management service by telephone. I also discussed with the patient that there may be a patient responsible charge related to this service. The patient expressed understanding and agreed to proceed.  .Chief Complaint: Cough (Cough, sore throat, hurts to swallow, and slight headache behind eyebrows. Started yesterday afternoon. Getting gradually worse.  No thermometer at home but no chills or sweats. Seeing white spots on tonsils- left side of throat is very dark red. )  Sore Throat  This is a new problem. The current episode started yesterday. There has been no fever. The pain is moderate. Associated symptoms include congestion, coughing (yellow phglem), headaches, swollen glands and trouble swallowing. Pertinent negatives include no diarrhea, plugged ear sensation, shortness of breath or vomiting. Associated symptoms comments: White patches on tonsils . He has had no exposure to strep or mono.    Review of Systems  Constitutional: Negative for chills, fatigue, fever and unexpected weight change.  HENT: Positive for congestion, sore throat and trouble swallowing.   Respiratory: Positive for cough (yellow phglem). Negative for chest tightness, shortness of breath and wheezing.   Cardiovascular: Negative for chest pain, palpitations and leg swelling.  Gastrointestinal: Negative for diarrhea and vomiting.  Skin: Negative for rash.  Neurological: Positive for headaches. Negative for dizziness, tremors, weakness and light-headedness.   Psychiatric/Behavioral: Negative for sleep disturbance.    Patient Active Problem List   Diagnosis Date Noted  . Coronary artery disease involving coronary bypass graft of native heart with angina pectoris ([place]) 10/13/2018  . Unspecified mood (affective) disorder ([place]) 06/19/2018  . AAA (abdominal aortic aneurysm) ([place]) 02/12/2018  . Spondylolisthesis of lumbar region 11/30/2017  . Lumbar radiculopathy 11/11/2017  . DDD (degenerative disc disease), lumbar 10/10/2017  . Uncontrolled type 2 diabetes mellitus without complication, without long-term current use of insulin 10/10/2015  . Low serum testosterone 10/10/2015  . Hematuria, gross 10/15/2014  . Allergic rhinitis 08/05/2014  . CAD in native artery 08/05/2014  . Hyperlipidemia associated with type 2 diabetes mellitus ([place]) 08/05/2014  . Epididymal pain 08/05/2014  . 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 Essential (primary) hypertension 08/05/2014  . Acid reflux 08/05/2014  . History of colon polyps 08/05/2014  . Obstructive apnea 08/05/2014  . 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 Cyclobenzaprine Other (See Comments)    Abdominal pain    Past Surgical History:  Procedure Laterality Date  . COLONOSCOPY  08/16/2014   tubular adenoma  . COLONOSCOPY  08/24/2017   one polyp - repeat 5 yrs  . CORONARY ANGIOPLASTY WITH STENT PLACEMENT  2013  . CORONARY ANGIOPLASTY WITH STENT PLACEMENT  01/2015   2 stents  - post circ and OM2  . CORONARY ARTERY BYPASS GRAFT  2004    Social History   Tobacco Use  . Smoking status: Former Research scientist (life sciences)  . Smokeless tobacco: Never Used  Substance Use Topics  . Alcohol use: No    Alcohol/week: 0.0 standard drinks  . Drug  use: No     Medication list has been reviewed and updated.  Current Meds  Medication Sig  . amLODipine (NORVASC) 5 MG tablet TAKE 1 TABLET BY MOUTH EVERY DAY  .  Aspirin-Acetaminophen-Caffeine (GOODYS EXTRA STRENGTH) 500-325-65 MG PACK Take 1 packet by mouth 2 (two) times daily.  Marland Kitchen atorvastatin (LIPITOR) 40 MG tablet Take 1 tablet (40 mg total) by mouth daily at 6 PM.  . BAYER CONTOUR NEXT TEST test strip USE TO TEST BLOOD GLUCOSE ONCE DAILY  . Choline Fenofibrate (FENOFIBRIC ACID) 135 MG CPDR TAKE 1 CAPSULE BY MOUTH DAILY  . empagliflozin (JARDIANCE) 25 MG TABS tablet Take 25 mg by mouth daily.  Marland Kitchen gabapentin (NEURONTIN) 300 MG capsule Take 1 capsule (300 mg total) by mouth 4 (four) times daily.  Marland Kitchen JANUMET XR [phone] MG TB24 TAKE 1 TABLET BY MOUTH DAILY  . lisinopril (ZESTRIL) 10 MG tablet Take 1 tablet (10 mg total) by mouth daily.  . metoprolol succinate (TOPROL-XL) 100 MG 24 hr tablet Take 1 tablet (100 mg total) by mouth 2 (two) times daily. Take with or immediately following a meal.  . MICROLET LANCETS MISC USE EACH DAY  . niacin (NIASPAN) 1000 MG CR tablet TAKE 1 TABLET(1000 MG) BY MOUTH AT BEDTIME  . REPATHA PUSHTRONEX SYSTEM 420 MG/3.5ML SOCT Inject 1 Dose into the muscle every 30 (thirty) days.  . sildenafil (VIAGRA) 100 MG tablet TAKE HALF TO ONE TABLET BY MOUTH ONCE DAILY AS NEEDED FOR ERECTILE DYSFUNCTION     PHQ 2/9 Scores 01/04/2019 10/13/2018 06/19/2018 02/13/2018  PHQ - 2 Score 0 0 1 0  PHQ- 9 Score - 0 3 0    BP Readings from Last 3 Encounters:  10/13/18 108/68  06/19/18 108/76  06/05/18 122/84    Physical Exam Pulmonary:     Effort: Pulmonary effort is normal.  Neurological:     Mental Status: He is alert.  Psychiatric:        Attention and Perception: Attention normal.        Mood and Affect: Mood normal.     Wt Readings from Last 3 Encounters:  01/04/19 265 lb (120.2 kg)  10/13/18 265 lb (120.2 kg)  06/19/18 269 lb (122 kg)    Ht 5\' 11"  (1.803 m)   Wt 265 lb (120.2 kg)   BMI 36.96 kg/m   Assessment and Plan: 1. Pharyngitis, unspecified etiology Continue antiinflammatories Use Choloroseptic spray or gargle  as needed for throat pain - amoxicillin-clavulanate (AUGMENTIN) 875-125 MG tablet; Take 1 tablet by mouth 2 (two) times daily for 10 days.  Dispense: 20 tablet; Refill: 0  I spent 8 minutes on this encounter. Partially dictated using . Any errors are unintentional.  Animal nutritionist, MD [facility]  01/04/2019

## 2019-03-22 ENCOUNTER — Encounter: Payer: Self-pay | Admitting: Internal Medicine

## 2019-03-22 ENCOUNTER — Ambulatory Visit (INDEPENDENT_AMBULATORY_CARE_PROVIDER_SITE_OTHER): Payer: No Typology Code available for payment source | Admitting: Internal Medicine

## 2019-03-22 ENCOUNTER — Other Ambulatory Visit: Payer: Self-pay

## 2019-03-22 VITALS — BP 128/66 | HR 64 | Ht 71.0 in | Wt 274.0 lb

## 2019-03-22 DIAGNOSIS — I25709 Atherosclerosis of coronary artery bypass graft(s), unspecified, with unspecified angina pectoris: Secondary | ICD-10-CM

## 2019-03-22 DIAGNOSIS — I10 Essential (primary) hypertension: Secondary | ICD-10-CM

## 2019-03-22 DIAGNOSIS — E1169 Type 2 diabetes mellitus with other specified complication: Secondary | ICD-10-CM

## 2019-03-22 DIAGNOSIS — E118 Type 2 diabetes mellitus with unspecified complications: Secondary | ICD-10-CM

## 2019-03-22 DIAGNOSIS — R05 Cough: Secondary | ICD-10-CM | POA: Diagnosis not present

## 2019-03-22 DIAGNOSIS — E785 Hyperlipidemia, unspecified: Secondary | ICD-10-CM | POA: Diagnosis not present

## 2019-03-22 DIAGNOSIS — R059 Cough, unspecified: Secondary | ICD-10-CM

## 2019-03-22 NOTE — Patient Instructions (Signed)
Chromium supplements for appetite control.

## 2019-03-22 NOTE — Progress Notes (Signed)
Date:  03/22/2019   Name:  Wyatt Mata   DOB:  April 29, 1955   MRN:  [id number]   Chief Complaint: Diabetes and Hypertension  Diabetes He presents for his follow-up diabetic visit. He has type 2 diabetes mellitus. His disease course has been stable. Pertinent negatives for hypoglycemia include no headaches or tremors. Pertinent negatives for diabetes include no chest pain, no fatigue, no polydipsia and no polyuria. Current diabetic treatment includes oral agent (triple therapy) (janumet and jardiance). He is compliant with treatment all of the time. An ACE inhibitor/angiotensin II receptor blocker is being taken.  Hypertension This is a chronic problem. The problem is controlled. Pertinent negatives include no chest pain, headaches, palpitations or shortness of breath. Past treatments include beta blockers, calcium channel blockers and ACE inhibitors. The current treatment provides significant improvement. Hypertensive end-organ damage includes CAD/MI.  Hyperlipidemia This is a chronic problem. The problem is controlled. Pertinent negatives include no chest pain or shortness of breath. Treatments tried: Repatha.  Cough This is a new problem. The current episode started more than 1 month ago. The problem has been unchanged. The cough is non-productive (with occasional yellow phlegm). Pertinent negatives include no chest pain, ear pain, headaches, nasal congestion, rash, sore throat, shortness of breath or wheezing. The symptoms are aggravated by lying down. He has tried nothing for the symptoms. There is no history of asthma or COPD.    Lab Results  Component Value Date   CREATININE 1.09 10/13/2018   BUN 18 10/13/2018   NA 140 10/13/2018   K 4.7 10/13/2018   CL 104 10/13/2018   CO2 19 (L) 10/13/2018   Lab Results  Component Value Date   CHOL 97 (L) 10/13/2018   HDL 35 (L) 10/13/2018   LDLCALC 32 10/13/2018   TRIG 152 (H) 10/13/2018   CHOLHDL 2.8 10/13/2018   Lab Results    Component Value Date   TSH 1.150 10/13/2018   Lab Results  Component Value Date   HGBA1C 7.1 (H) 10/13/2018     Review of Systems  Constitutional: Negative for appetite change, fatigue and unexpected weight change.  HENT: Negative for ear pain and sore throat.   Eyes: Negative for visual disturbance.  Respiratory: Positive for cough. Negative for chest tightness, shortness of breath and wheezing.   Cardiovascular: Negative for chest pain, palpitations and leg swelling.  Gastrointestinal: Negative for abdominal pain and blood in stool.  Endocrine: Negative for polydipsia and polyuria.  Genitourinary: Negative for dysuria and hematuria.  Musculoskeletal: Positive for arthralgias (right arm pain with exertion/sob that resolves with rest).  Skin: Negative for color change and rash.  Neurological: Negative for tremors, numbness and headaches.  Hematological: Negative for adenopathy.  Psychiatric/Behavioral: Negative for dysphoric mood and sleep disturbance.     Patient Active Problem List   Diagnosis Date Noted  . Coronary artery disease involving coronary bypass graft of native heart with angina pectoris ([place]) 10/13/2018  . Unspecified mood (affective) disorder ([place]) 06/19/2018  . AAA (abdominal aortic aneurysm) ([place]) 02/12/2018  . Spondylolisthesis of lumbar region 11/30/2017  . Lumbar radiculopathy 11/11/2017  . DDD (degenerative disc disease), lumbar 10/10/2017  . Type II diabetes mellitus with complication ([place]) 83/38/2505  . Low serum testosterone 10/10/2015  . Hematuria, gross 10/15/2014  . Allergic rhinitis 08/05/2014  . CAD in native artery 08/05/2014  . Hyperlipidemia associated with type 2 diabetes mellitus ([place]) 08/05/2014  . Epididymal pain 08/05/2014  . 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  Essential (primary) hypertension 08/05/2014  . Acid reflux 08/05/2014  . History of colon polyps 08/05/2014  . Obstructive apnea 08/05/2014  .  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 Cyclobenzaprine Other (See Comments)    Abdominal pain    Past Surgical History:  Procedure Laterality Date  . COLONOSCOPY  08/16/2014   tubular adenoma  . COLONOSCOPY  08/24/2017   one polyp - repeat 5 yrs  . CORONARY ANGIOPLASTY WITH STENT PLACEMENT  2013  . CORONARY ANGIOPLASTY WITH STENT PLACEMENT  01/2015   2 stents  - post circ and OM2  . CORONARY ARTERY BYPASS GRAFT  2004    Social History   Tobacco Use  . Smoking status: Former Games developer  . Smokeless tobacco: Never Used  Substance Use Topics  . Alcohol use: No    Alcohol/week: 0.0 standard drinks  . Drug use: No     Medication list has been reviewed and updated.  Current Meds  Medication Sig  . amLODipine (NORVASC) 5 MG tablet TAKE 1 TABLET BY MOUTH EVERY DAY  . Aspirin-Acetaminophen-Caffeine (GOODYS EXTRA STRENGTH) 500-325-65 MG PACK Take 1 packet by mouth 2 (two) times daily.  Marland Kitchen atorvastatin (LIPITOR) 40 MG tablet Take 1 tablet (40 mg total) by mouth daily at 6 PM.  . BAYER CONTOUR NEXT TEST test strip USE TO TEST BLOOD GLUCOSE ONCE DAILY  . Choline Fenofibrate (FENOFIBRIC ACID) 135 MG CPDR Take by mouth.  . empagliflozin (JARDIANCE) 25 MG TABS tablet Take 25 mg by mouth daily.  Marland Kitchen gabapentin (NEURONTIN) 300 MG capsule Take 1 capsule (300 mg total) by mouth 4 (four) times daily.  Marland Kitchen JANUMET XR [phone] MG TB24 TAKE 1 TABLET BY MOUTH DAILY  . lisinopril (ZESTRIL) 10 MG tablet Take 1 tablet (10 mg total) by mouth daily.  . metoprolol succinate (TOPROL-XL) 100 MG 24 hr tablet Take 1 tablet (100 mg total) by mouth 2 (two) times daily. Take with or immediately following a meal.  . MICROLET LANCETS MISC USE EACH DAY  . REPATHA PUSHTRONEX SYSTEM 420 MG/3.5ML SOCT Inject 1 Dose into the muscle every 30 (thirty) days.  . sildenafil (VIAGRA) 100 MG tablet TAKE HALF TO ONE TABLET BY MOUTH ONCE DAILY AS NEEDED FOR  ERECTILE DYSFUNCTION   . [DISCONTINUED] sildenafil (VIAGRA) 100 MG tablet take one-half to one tablet by mouth daily as needed for erectile dysfunction    PHQ 2/9 Scores 03/22/2019 01/04/2019 10/13/2018 06/19/2018  PHQ - 2 Score 0 0 0 1  PHQ- 9 Score - - 0 3    BP Readings from Last 3 Encounters:  03/22/19 128/66  10/13/18 108/68  06/19/18 108/76    Physical Exam Vitals and nursing note reviewed.  Constitutional:      General: He is not in acute distress.    Appearance: He is well-developed.  HENT:     Head: Normocephalic and atraumatic.     Right Ear: Tympanic membrane normal.     Left Ear: Tympanic membrane normal.     Nose:     Right Sinus: No maxillary sinus tenderness or frontal sinus tenderness.     Left Sinus: No maxillary sinus tenderness or frontal sinus tenderness.  Neck:     Vascular: No carotid bruit.  Cardiovascular:     Rate and Rhythm: Normal rate and regular rhythm.     Pulses: Normal pulses.  Pulmonary:     Effort: Pulmonary effort is normal. No respiratory distress.  Breath sounds: No wheezing or rhonchi.  Musculoskeletal:        General: Normal range of motion.     Cervical back: Normal range of motion.     Right lower leg: No edema.     Left lower leg: No edema.  Lymphadenopathy:     Cervical: No cervical adenopathy.  Skin:    General: Skin is warm and dry.     Capillary Refill: Capillary refill takes less than 2 seconds.     Findings: No rash.  Neurological:     General: No focal deficit present.     Mental Status: He is alert and oriented to person, place, and time.  Psychiatric:        Behavior: Behavior normal.        Thought Content: Thought content normal.     Wt Readings from Last 3 Encounters:  03/22/19 274 lb (124.3 kg)  01/04/19 265 lb (120.2 kg)  10/13/18 265 lb (120.2 kg)    BP 128/66   Mata 64   Ht 5\' 11"  (1.803 m)   Wt 274 lb (124.3 kg)   SpO2 96%   BMI 38.22 kg/m   Assessment and Plan: 1. Type II diabetes  mellitus with complication (HCC) Clinically stable by exam and report without s/s of hypoglycemia. DM complicated by CAD, HTN, hyperlipidemia. Tolerating medications janumet and jardiance well without side effects or other concerns. Patient is reminded to schedule Diabetic eye exam - Hemoglobin A1c  2. Essential (primary) hypertension Clinically stable exam with well controlled BP.   Tolerating medications, metoprolol 100 mg bid, amlodipine 5 mg and lisinopril 10 mg, without side effects at this time. Pt to continue current regimen and low sodium diet; benefits of regular exercise as able discussed.  3. Hyperlipidemia associated with type 2 diabetes mellitus (HCC) Currently on Repatha with excellent LDL lowering He is also on max tolerated dose of lipitor 40 mg.  He has niacin on his list but he is not taking it - he thinks he was supposed to stop it when he started Repatha.  I reviewed his notes in Care Everywhere and it continues to be on his med list - he will call his cardiologist to confirm - Comprehensive metabolic panel  4. Cough Intermittent with scant yellow phlegm at times. No fever, chills, change in shortness of breath No currently taking any sinus or allergy medication Suspect this is due to sinus drainage - no treatment is needed at this time  5. Coronary artery disease involving coronary bypass graft of native heart with angina pectoris (HCC) Right arm pain with exertion is likely anginal - he is clinically stable with recent Stress Echo. Cardiology did not recommend cardiac cath at this time Patient declines NTG to use PRN   Partially dictated using Dragon software. Any errors are unintentional.  Bari EdwardLaura Carleigh Buccieri, MD [facility]  03/22/2019

## 2019-03-23 LAB — COMPREHENSIVE METABOLIC PANEL
ALT: 42 IU/L (ref 0–44)
AST: 27 IU/L (ref 0–40)
Albumin/Globulin Ratio: 2 (ref 1.2–2.2)
Albumin: 4.5 g/dL (ref 3.8–4.8)
Alkaline Phosphatase: 57 IU/L (ref 39–117)
BUN/Creatinine Ratio: 18 (ref 10–24)
BUN: 20 mg/dL (ref 8–27)
Bilirubin Total: 0.4 mg/dL (ref 0.0–1.2)
CO2: 20 mmol/L (ref 20–29)
Calcium: 10 mg/dL (ref 8.6–10.2)
Chloride: 102 mmol/L (ref 96–106)
Creatinine, Ser: 1.09 mg/dL (ref 0.76–1.27)
GFR calc Af Amer: 83 mL/min/{1.73_m2} (ref >59)
GFR calc non Af Amer: 72 mL/min/{1.73_m2} (ref >59)
Globulin, Total: 2.2 g/dL (ref 1.5–4.5)
Glucose: 101 mg/dL — ABNORMAL HIGH (ref 65–99)
Potassium: 4.6 mmol/L (ref 3.5–5.2)
Sodium: 141 mmol/L (ref 134–144)
Total Protein: 6.7 g/dL (ref 6.0–8.5)

## 2019-03-23 LAB — HEMOGLOBIN A1C
Est. average glucose Bld gHb Est-mCnc: 160 mg/dL
Hgb A1c MFr Bld: 7.2 % — ABNORMAL HIGH (ref 4.8–5.6)

## 2019-03-27 ENCOUNTER — Other Ambulatory Visit: Payer: Self-pay | Admitting: Internal Medicine

## 2019-04-15 ENCOUNTER — Other Ambulatory Visit: Payer: Self-pay | Admitting: Internal Medicine

## 2019-04-15 DIAGNOSIS — I10 Essential (primary) hypertension: Secondary | ICD-10-CM

## 2019-04-23 ENCOUNTER — Other Ambulatory Visit: Payer: Self-pay | Admitting: Internal Medicine

## 2019-04-23 DIAGNOSIS — E785 Hyperlipidemia, unspecified: Secondary | ICD-10-CM

## 2019-04-23 DIAGNOSIS — E1169 Type 2 diabetes mellitus with other specified complication: Secondary | ICD-10-CM

## 2019-04-30 ENCOUNTER — Other Ambulatory Visit: Payer: Self-pay | Admitting: Internal Medicine

## 2019-05-22 ENCOUNTER — Other Ambulatory Visit: Payer: Self-pay | Admitting: Internal Medicine
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
## 2019-07-23 ENCOUNTER — Other Ambulatory Visit: Payer: Self-pay | Admitting: Internal Medicine

## 2019-08-02 ENCOUNTER — Telehealth: Payer: Self-pay | Admitting: Internal Medicine

## 2019-08-02 ENCOUNTER — Other Ambulatory Visit: Payer: Self-pay

## 2019-08-02 DIAGNOSIS — M79671 Pain in right foot: Secondary | ICD-10-CM

## 2019-08-02 DIAGNOSIS — M25551 Pain in right hip: Secondary | ICD-10-CM
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
## 2019-08-02 NOTE — Telephone Encounter (Signed)
Pt informed referral placed for [facility] for right foot and hip pain. Pt will call eye doctor to schedule appt- no referral needed.   CM

## 2019-08-15 ENCOUNTER — Other Ambulatory Visit: Payer: Self-pay

## 2019-08-15 ENCOUNTER — Telehealth: Payer: Self-pay | Admitting: Internal Medicine

## 2019-08-15 DIAGNOSIS — Z0100 Encounter for examination of eyes and vision without abnormal findings: Secondary | ICD-10-CM
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
## 2019-08-15 NOTE — Telephone Encounter (Signed)
Sent in referral to [facility].   CM

## 2019-09-17 DIAGNOSIS — E119 Type 2 diabetes mellitus without complications: Secondary | ICD-10-CM | POA: Diagnosis not present

## 2019-09-17 DIAGNOSIS — H2513 Age-related nuclear cataract, bilateral: Secondary | ICD-10-CM | POA: Diagnosis not present

## 2019-10-02 DIAGNOSIS — M7061 Trochanteric bursitis, right hip: Secondary | ICD-10-CM | POA: Diagnosis not present

## 2019-10-02 DIAGNOSIS — M7062 Trochanteric bursitis, left hip: Secondary | ICD-10-CM | POA: Diagnosis not present

## 2019-10-02 DIAGNOSIS — M48061 Spinal stenosis, lumbar region without neurogenic claudication: Secondary | ICD-10-CM | POA: Diagnosis not present

## 2019-10-02 DIAGNOSIS — M545 Low back pain: Secondary | ICD-10-CM | POA: Diagnosis not present

## 2019-10-19 ENCOUNTER — Other Ambulatory Visit: Payer: Self-pay

## 2019-10-19 ENCOUNTER — Encounter: Payer: Self-pay | Admitting: Internal Medicine

## 2019-10-19 ENCOUNTER — Ambulatory Visit (INDEPENDENT_AMBULATORY_CARE_PROVIDER_SITE_OTHER): Payer: 59 | Admitting: Internal Medicine

## 2019-10-19 VITALS — BP 126/78 | HR 68 | Temp 98.0°F | Ht 71.0 in | Wt 267.0 lb

## 2019-10-19 DIAGNOSIS — E118 Type 2 diabetes mellitus with unspecified complications: Secondary | ICD-10-CM

## 2019-10-19 DIAGNOSIS — E1169 Type 2 diabetes mellitus with other specified complication: Secondary | ICD-10-CM

## 2019-10-19 DIAGNOSIS — I10 Essential (primary) hypertension: Secondary | ICD-10-CM

## 2019-10-19 DIAGNOSIS — G4733 Obstructive sleep apnea (adult) (pediatric): Secondary | ICD-10-CM

## 2019-10-19 DIAGNOSIS — I714 Abdominal aortic aneurysm, without rupture, unspecified: Secondary | ICD-10-CM

## 2019-10-19 DIAGNOSIS — Z125 Encounter for screening for malignant neoplasm of prostate: Secondary | ICD-10-CM | POA: Diagnosis not present

## 2019-10-19 DIAGNOSIS — E785 Hyperlipidemia, unspecified: Secondary | ICD-10-CM | POA: Diagnosis not present

## 2019-10-19 DIAGNOSIS — Z0000 Encounter for general adult medical examination without abnormal findings: Secondary | ICD-10-CM

## 2019-10-19 DIAGNOSIS — I25709 Atherosclerosis of coronary artery bypass graft(s), unspecified, with unspecified angina pectoris: Secondary | ICD-10-CM

## 2019-10-19 LAB — POCT URINALYSIS DIPSTICK
Bilirubin, UA: NEGATIVE
Blood, UA: NEGATIVE
Glucose, UA: POSITIVE — AB
Ketones, UA: NEGATIVE
Leukocytes, UA: NEGATIVE
Nitrite, UA: NEGATIVE
Protein, UA: NEGATIVE
Spec Grav, UA: 1.015 (ref 1.010–1.025)
Urobilinogen, UA: 0.2 E.U./dL
pH, UA: 5 (ref 5.0–8.0)

## 2019-10-19 NOTE — Progress Notes (Signed)
Date:  10/19/2019   Name:  Wyatt Mata   DOB:  07/04/1955   MRN:  [id number]   Chief Complaint: Annual Exam  Wyatt Mata is a 64 y.o. male who presents today for his Complete Annual Exam. He feels fairly well. He reports exercising - none. He reports he is sleeping fairly well.   Colonoscopy: 08/2017  Immunization History  Administered Date(s) Administered  . Influenza,inj,Quad PF,6+ Mos 01/10/2015, 01/13/2018, 12/08/2018  . Influenza-Unspecified 01/03/2017  . Moderna SARS-COVID-2 Vaccination 05/21/2019, 06/21/2019  . Pneumococcal Polysaccharide-23 07/30/2011  . Td 09/19/2008, 06/19/2018  . Zoster 02/02/2013    Hypertension This is a chronic problem. The problem is controlled. Pertinent negatives include no chest pain, headaches, palpitations or shortness of breath. Past treatments include beta blockers, calcium channel blockers and ACE inhibitors. The current treatment provides significant improvement. There are no compliance problems.   Diabetes He presents for his follow-up diabetic visit. He has type 2 diabetes mellitus. Pertinent negatives for hypoglycemia include no dizziness, headaches or nervousness/anxiousness. Associated symptoms include fatigue (runs out of energy by lunch time). Pertinent negatives for diabetes include no chest pain. Current diabetic treatment includes oral agent (triple therapy) (janumet and jardiance). An ACE inhibitor/angiotensin II receptor blocker is being taken. Eye exam is not current.  Hyperlipidemia This is a chronic problem. The problem is controlled. Pertinent negatives include no chest pain, myalgias or shortness of breath. Current antihyperlipidemic treatment includes fibric acid derivatives and statins (and Repatha).  CAD - stable without recent chest pain or SOB.  He continues to take repatha without side effects.  He does not monitor his BP or his BS.  Lab Results  Component Value Date   CREATININE 1.09 03/22/2019   BUN 20  03/22/2019   NA 141 03/22/2019   K 4.6 03/22/2019   CL 102 03/22/2019   CO2 20 03/22/2019   Lab Results  Component Value Date   CHOL 97 (L) 10/13/2018   HDL 35 (L) 10/13/2018   LDLCALC 32 10/13/2018   TRIG 152 (H) 10/13/2018   CHOLHDL 2.8 10/13/2018   Lab Results  Component Value Date   TSH 1.150 10/13/2018   Lab Results  Component Value Date   HGBA1C 7.2 (H) 03/22/2019   Lab Results  Component Value Date   WBC 5.5 10/13/2018   HGB 17.3 10/13/2018   HCT 49.6 10/13/2018   MCV 87 10/13/2018   PLT 134 (L) 10/13/2018   Lab Results  Component Value Date   ALT 42 03/22/2019   AST 27 03/22/2019   ALKPHOS 57 03/22/2019   BILITOT 0.4 03/22/2019     Review of Systems  Constitutional: Positive for fatigue (runs out of energy by lunch time). Negative for appetite change, chills, diaphoresis and unexpected weight change.  HENT: Negative for hearing loss, tinnitus, trouble swallowing and voice change.   Eyes: Negative for visual disturbance.  Respiratory: Negative for choking, shortness of breath and wheezing.   Cardiovascular: Negative for chest pain, palpitations and leg swelling.  Gastrointestinal: Negative for abdominal pain (groin pain), blood in stool, constipation and diarrhea.  Genitourinary: Negative for difficulty urinating, dysuria and frequency.  Musculoskeletal: Positive for back pain. Negative for arthralgias and myalgias.  Skin: Negative for color change and rash.  Neurological: Positive for light-headedness (intermittent since covid vaccine). Negative for dizziness, syncope and headaches.  Hematological: Negative for adenopathy.  Psychiatric/Behavioral: Negative for dysphoric mood and sleep disturbance. The patient is not nervous/anxious.     Patient Active Problem List  Diagnosis Date Noted  . Coronary artery disease involving coronary bypass graft of native heart with angina pectoris (HCC) 10/13/2018  . Unspecified mood (affective) disorder (HCC)  06/19/2018  . AAA (abdominal aortic aneurysm) (HCC) 02/12/2018  . Spondylolisthesis of lumbar region 11/30/2017  . Lumbar radiculopathy 11/11/2017  . DDD (degenerative disc disease), lumbar 10/10/2017  . Type II diabetes mellitus with complication (HCC) 10/10/2015  . Low serum testosterone 10/10/2015  . Hematuria, gross 10/15/2014  . Allergic rhinitis 08/05/2014  . CAD in native artery 08/05/2014  . Hyperlipidemia associated with type 2 diabetes mellitus (HCC) 08/05/2014  . Epididymal pain 08/05/2014  . 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 Essential (primary) hypertension 08/05/2014  . Acid reflux 08/05/2014  . History of colon polyps 08/05/2014  . Obstructive apnea 08/05/2014  . 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 Cyclobenzaprine Other (See Comments)    Abdominal pain    Past Surgical History:  Procedure Laterality Date  . COLONOSCOPY  08/16/2014   tubular adenoma  . COLONOSCOPY  08/24/2017   one polyp - repeat 5 yrs  . CORONARY ANGIOPLASTY WITH STENT PLACEMENT  2013  . CORONARY ANGIOPLASTY WITH STENT PLACEMENT  01/2015   2 stents  - post circ and OM2  . CORONARY ARTERY BYPASS GRAFT  2004    Social History   Tobacco Use  . Smoking status: Former Games developer  . Smokeless tobacco: Never Used  Vaping Use  . Vaping Use: Never used  Substance Use Topics  . Alcohol use: No    Alcohol/week: 0.0 standard drinks  . Drug use: No     Medication list has been reviewed and updated.  Current Meds  Medication Sig  . amLODipine (NORVASC) 5 MG tablet TAKE 1 TABLET BY MOUTH EVERY DAY  . Aspirin-Acetaminophen-Caffeine (GOODYS EXTRA STRENGTH) 500-325-65 MG PACK Take 1 packet by mouth 2 (two) times daily.  Marland Kitchen atorvastatin (LIPITOR) 40 MG tablet TAKE 1 TABLET(40 MG) BY MOUTH DAILY AT 6 PM (Patient taking differently: Not taking every day like "I Should.")  . BAYER CONTOUR NEXT TEST test  strip USE TO TEST BLOOD GLUCOSE ONCE DAILY  . Choline Fenofibrate (FENOFIBRIC ACID) 135 MG CPDR TAKE 1 CAPSULE BY MOUTH DAILY  . gabapentin (NEURONTIN) 300 MG capsule Take 1 capsule (300 mg total) by mouth 4 (four) times daily.  Marland Kitchen JANUMET XR [phone] MG TB24 TAKE 1 TABLET BY MOUTH DAILY  . JARDIANCE 25 MG TABS tablet TAKE 1 TABLET BY MOUTH DAILY  . lisinopril (ZESTRIL) 10 MG tablet TAKE 1 TABLET BY MOUTH DAILY  . metoprolol succinate (TOPROL-XL) 100 MG 24 hr tablet TAKE 1 TABLET BY MOUTH TWICE DAILY  . MICROLET LANCETS MISC USE EACH DAY  . niacin (NIASPAN) 1000 MG CR tablet TAKE 1 TABLET(1000 MG) BY MOUTH AT BEDTIME  . REPATHA PUSHTRONEX SYSTEM 420 MG/3.5ML SOCT Inject 1 Dose into the muscle every 30 (thirty) days.  . sildenafil (VIAGRA) 100 MG tablet TAKE HALF TO ONE TABLET BY MOUTH ONCE DAILY AS NEEDED FOR ERECTILE DYSFUNCTION     PHQ 2/9 Scores 03/22/2019 01/04/2019 10/13/2018 06/19/2018  PHQ - 2 Score 0 0 0 1  PHQ- 9 Score - - 0 3    No flowsheet data found.  BP Readings from Last 3 Encounters:  10/19/19 126/78  03/22/19 128/66  10/13/18 108/68    Physical Exam Vitals and nursing note reviewed.  Constitutional:      Appearance:  Normal appearance. He is well-developed.  HENT:     Head: Normocephalic.     Right Ear: Tympanic membrane, ear canal and external ear normal.     Left Ear: Tympanic membrane, ear canal and external ear normal.     Nose: Nose normal.     Mouth/Throat:     Pharynx: Uvula midline.  Eyes:     Conjunctiva/sclera: Conjunctivae normal.     Pupils: Pupils are equal, round, and reactive to light.  Neck:     Thyroid: No thyromegaly.     Vascular: No carotid bruit.  Cardiovascular:     Rate and Rhythm: Normal rate and regular rhythm.     Heart sounds: Normal heart sounds.  Pulmonary:     Effort: Pulmonary effort is normal.     Breath sounds: Normal breath sounds. No wheezing.  Chest:     Breasts:        Right: No mass.        Left: No mass.    Abdominal:     General: Bowel sounds are normal.     Palpations: Abdomen is soft.     Tenderness: There is no abdominal tenderness.  Musculoskeletal:        General: Normal range of motion.     Cervical back: Normal range of motion and neck supple.     Right lower leg: No edema.     Left lower leg: No edema.  Lymphadenopathy:     Cervical: No cervical adenopathy.  Skin:    General: Skin is warm and dry.     Capillary Refill: Capillary refill takes less than 2 seconds.  Neurological:     General: No focal deficit present.     Mental Status: He is alert and oriented to person, place, and time.     Deep Tendon Reflexes: Reflexes are normal and symmetric.  Psychiatric:        Mood and Affect: Mood normal.        Speech: Speech normal.        Behavior: Behavior normal.      Wt Readings from Last 3 Encounters:  10/19/19 (!) 267 lb (121.1 kg)  03/22/19 274 lb (124.3 kg)  01/04/19 265 lb (120.2 kg)    BP 126/78   Pulse 68   Temp 98 F (36.7 C) (Oral)   Ht 5\' 11"  (1.803 m)   Wt (!) 267 lb (121.1 kg)   SpO2 96%   BMI 37.24 kg/m   Assessment and Plan: 1. Annual physical exam Exam is normal except for weight. Encourage regular exercise and appropriate dietary changes. - POCT urinalysis dipstick  2. Type II diabetes mellitus with complication (HCC) Clinically stable by exam and report without s/s of hypoglycemia. He does not check his BS DM complicated by htn, lipids. Tolerating medications  well without side effects or other concerns. - Hemoglobin A1c  3. Essential (primary) hypertension Clinically stable exam with well controlled BP. Tolerating medications without side effects at this time. No recurrent chest pain Pt to continue current regimen and low sodium diet; benefits of regular exercise as able discussed. - CBC with Differential/Platelet - Comprehensive metabolic panel  4. Hyperlipidemia associated with type 2 diabetes mellitus (HCC) Tolerating statin  medication without side effects at this time LDL is at goal of < 70 on current dose Continue same therapy without change at this time. - Lipid panel  5. Prostate cancer screening DRE deferred - PSA  6. Abdominal aortic aneurysm (AAA) without rupture (  HCC) MRI in 2019 was 3x3 cm and unchanged  7. Coronary artery disease involving coronary bypass graft of native heart with angina pectoris (HCC) Stable, followed by cardiology  8. Obstructive apnea Untreated OSA - pt declines referral due to cost but this is likely the cause of his decreased energy   Partially dictated using Animal nutritionist. Any errors are unintentional.  Bari Edward, MD [facility]  10/19/2019

## 2019-10-19 NOTE — Patient Instructions (Signed)
Take gabapentin in divided doses - 2 twice a day

## 2019-10-20 LAB — COMPREHENSIVE METABOLIC PANEL
ALT: 31 IU/L (ref 0–44)
AST: 23 IU/L (ref 0–40)
Albumin/Globulin Ratio: 2.1 (ref 1.2–2.2)
Albumin: 4.5 g/dL (ref 3.8–4.8)
Alkaline Phosphatase: 54 IU/L (ref 48–121)
BUN/Creatinine Ratio: 19 (ref 10–24)
BUN: 19 mg/dL (ref 8–27)
Bilirubin Total: 0.6 mg/dL (ref 0.0–1.2)
CO2: 20 mmol/L (ref 20–29)
Calcium: 9.2 mg/dL (ref 8.6–10.2)
Chloride: 104 mmol/L (ref 96–106)
Creatinine, Ser: 0.99 mg/dL (ref 0.76–1.27)
GFR calc Af Amer: 93 mL/min/{1.73_m2} (ref >59)
GFR calc non Af Amer: 80 mL/min/{1.73_m2} (ref >59)
Globulin, Total: 2.1 g/dL (ref 1.5–4.5)
Glucose: 117 mg/dL — ABNORMAL HIGH (ref 65–99)
Potassium: 4.5 mmol/L (ref 3.5–5.2)
Sodium: 140 mmol/L (ref 134–144)
Total Protein: 6.6 g/dL (ref 6.0–8.5)

## 2019-10-20 LAB — CBC WITH DIFFERENTIAL/PLATELET
Basophils Absolute: 0 10*3/uL (ref 0.0–0.2)
Basos: 1 %
EOS (ABSOLUTE): 0.2 10*3/uL (ref 0.0–0.4)
Eos: 4 %
Hematocrit: 49.2 % (ref 37.5–51.0)
Hemoglobin: 16.8 g/dL (ref 13.0–17.7)
Immature Grans (Abs): 0 10*3/uL (ref 0.0–0.1)
Immature Granulocytes: 1 %
Lymphocytes Absolute: 1.7 10*3/uL (ref 0.7–3.1)
Lymphs: 28 %
MCH: 30.5 pg (ref 26.6–33.0)
MCHC: 34.1 g/dL (ref 31.5–35.7)
MCV: 90 fL (ref 79–97)
Monocytes Absolute: 0.7 10*3/uL (ref 0.1–0.9)
Monocytes: 12 %
Neutrophils Absolute: 3.3 10*3/uL (ref 1.4–7.0)
Neutrophils: 54 %
Platelets: 142 10*3/uL — ABNORMAL LOW (ref 150–450)
RBC: 5.5 x10E6/uL (ref 4.14–5.80)
RDW: 14.5 % (ref 11.6–15.4)
WBC: 5.9 10*3/uL (ref 3.4–10.8)

## 2019-10-20 LAB — LIPID PANEL
Chol/HDL Ratio: 2.4 ratio (ref 0.0–5.0)
Cholesterol, Total: 72 mg/dL — ABNORMAL LOW (ref 100–199)
HDL: 30 mg/dL — ABNORMAL LOW (ref >39)
LDL Chol Calc (NIH): 16 mg/dL (ref 0–99)
Triglycerides: 156 mg/dL — ABNORMAL HIGH (ref 0–149)
VLDL Cholesterol Cal: 26 mg/dL (ref 5–40)

## 2019-10-20 LAB — HEMOGLOBIN A1C
Est. average glucose Bld gHb Est-mCnc: 166 mg/dL
Hgb A1c MFr Bld: 7.4 % — ABNORMAL HIGH (ref 4.8–5.6)

## 2019-10-20 LAB — PSA: Prostate Specific Ag, Serum: 1.9 ng/mL (ref 0.0–4.0)

## 2019-12-04 ENCOUNTER — Other Ambulatory Visit: Payer: Self-pay | Admitting: Internal Medicine

## 2020-01-03 ENCOUNTER — Telehealth: Payer: Self-pay

## 2020-01-03 NOTE — Telephone Encounter (Signed)
Copied from CRM [id number]. Topic: General - Other >> Jan 03, 2020  3:53 PM Dalphine Handing A wrote: Patient is requesting his cpe appt be rescheduled in August, on Friday early morning. Advised patient of next available opening, patient insisted that message be sent to pcp to work him in with his preferences. Please advise

## 2020-01-04 ENCOUNTER — Ambulatory Visit (INDEPENDENT_AMBULATORY_CARE_PROVIDER_SITE_OTHER): Payer: 59

## 2020-01-04 ENCOUNTER — Other Ambulatory Visit: Payer: Self-pay

## 2020-01-04 ENCOUNTER — Ambulatory Visit: Payer: 59

## 2020-01-04 DIAGNOSIS — Z23 Encounter for immunization: Secondary | ICD-10-CM

## 2020-01-06 ENCOUNTER — Other Ambulatory Visit: Payer: Self-pay | Admitting: Internal Medicine

## 2020-01-06 NOTE — Telephone Encounter (Signed)
Requested Prescriptions  Pending Prescriptions Disp Refills  . gabapentin (NEURONTIN) 300 MG capsule [Pharmacy Med Name: GABAPENTIN 300MG  CAPSULES] 360 capsule 3    Sig: TAKE 1 CAPSULE(300 MG) BY MOUTH FOUR TIMES DAILY     Neurology: Anticonvulsants - gabapentin Passed - 01/06/2020  3:32 AM      Passed - Valid encounter within last 12 months    Recent Outpatient Visits          2 months ago Annual physical exam   [facility], MD   9 months ago Type II diabetes mellitus with complication [facility])   [facility], MD   1 year ago Pharyngitis, unspecified etiology   [facility] Reubin Milan, MD   1 year ago Annual physical exam   [facility], MD   1 year ago Uncontrolled type 2 diabetes mellitus without complication, without long-term current use of insulin [facility])   [facility], MD      Future Appointments            In 1 month Reubin Milan Judithann Graves, MD [facility]   In 11 months [facility] Judithann Graves, MD [facility]

## 2020-01-09 DIAGNOSIS — I2510 Atherosclerotic heart disease of native coronary artery without angina pectoris: Secondary | ICD-10-CM | POA: Diagnosis not present

## 2020-01-09 DIAGNOSIS — E782 Mixed hyperlipidemia: Secondary | ICD-10-CM | POA: Diagnosis not present

## 2020-01-09 LAB — LIPID PANEL
Cholesterol: 165 (ref 0–200)
HDL: 30 — AB (ref 35–70)
LDL Cholesterol: 61
Triglycerides: 369 — AB (ref 40–160)

## 2020-01-16 ENCOUNTER — Other Ambulatory Visit: Payer: Self-pay | Admitting: Internal Medicine

## 2020-02-15 ENCOUNTER — Ambulatory Visit: Payer: 59 | Admitting: Internal Medicine

## 2020-02-29 ENCOUNTER — Ambulatory Visit: Payer: 59 | Admitting: Internal Medicine

## 2020-02-29 ENCOUNTER — Other Ambulatory Visit: Payer: Self-pay

## 2020-02-29 ENCOUNTER — Encounter: Payer: Self-pay | Admitting: Internal Medicine

## 2020-02-29 VITALS — BP 100/64 | HR 66 | Temp 98.2°F | Ht 71.0 in | Wt 269.0 lb

## 2020-02-29 DIAGNOSIS — I10 Essential (primary) hypertension: Secondary | ICD-10-CM | POA: Diagnosis not present

## 2020-02-29 DIAGNOSIS — E785 Hyperlipidemia, unspecified: Secondary | ICD-10-CM

## 2020-02-29 DIAGNOSIS — E118 Type 2 diabetes mellitus with unspecified complications: Secondary | ICD-10-CM | POA: Diagnosis not present

## 2020-02-29 DIAGNOSIS — M542 Cervicalgia: Secondary | ICD-10-CM | POA: Diagnosis not present

## 2020-02-29 DIAGNOSIS — E1169 Type 2 diabetes mellitus with other specified complication: Secondary | ICD-10-CM | POA: Diagnosis not present

## 2020-02-29 NOTE — Patient Instructions (Signed)
Stop Niacin for several weeks to be sure that the side effects resolve.  Call Cardiology to see what they suggest.
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
## 2020-03-01 LAB — BASIC METABOLIC PANEL
BUN/Creatinine Ratio: 16 (ref 10–24)
BUN: 18 mg/dL (ref 8–27)
CO2: 21 mmol/L (ref 20–29)
Calcium: 9.4 mg/dL (ref 8.6–10.2)
Chloride: 101 mmol/L (ref 96–106)
Creatinine, Ser: 1.1 mg/dL (ref 0.76–1.27)
GFR calc Af Amer: 82 mL/min/{1.73_m2} (ref >59)
GFR calc non Af Amer: 71 mL/min/{1.73_m2} (ref >59)
Glucose: 128 mg/dL — ABNORMAL HIGH (ref 65–99)
Potassium: 4.8 mmol/L (ref 3.5–5.2)
Sodium: 137 mmol/L (ref 134–144)

## 2020-03-01 LAB — HEMOGLOBIN A1C
Est. average glucose Bld gHb Est-mCnc: 189 mg/dL
Hgb A1c MFr Bld: 8.2 % — ABNORMAL HIGH (ref 4.8–5.6)

## 2020-03-04 ENCOUNTER — Telehealth: Payer: Self-pay

## 2020-03-04 NOTE — Telephone Encounter (Signed)
Called pt went over his labs results.  KP

## 2020-03-04 NOTE — Telephone Encounter (Unsigned)
Copied from CRM [id number]. Topic: General - Call Back - No Documentation >> Mar 04, 2020  8:49 AM Lyn Hollingshead D wrote: PT returning call / no voicemail / please advise

## 2020-03-31 DIAGNOSIS — R051 Acute cough: Secondary | ICD-10-CM | POA: Diagnosis not present

## 2020-03-31 DIAGNOSIS — Z20828 Contact with and (suspected) exposure to other viral communicable diseases: Secondary | ICD-10-CM | POA: Diagnosis not present

## 2020-03-31 DIAGNOSIS — J3489 Other specified disorders of nose and nasal sinuses: Secondary | ICD-10-CM | POA: Diagnosis not present

## 2020-03-31 DIAGNOSIS — R509 Fever, unspecified: Secondary | ICD-10-CM | POA: Diagnosis not present

## 2020-05-19 ENCOUNTER — Other Ambulatory Visit: Payer: Self-pay | Admitting: Internal Medicine

## 2020-05-19 DIAGNOSIS — I10 Essential (primary) hypertension: Secondary | ICD-10-CM

## 2020-05-21 ENCOUNTER — Other Ambulatory Visit: Payer: Self-pay | Admitting: Internal Medicine

## 2020-06-17 ENCOUNTER — Other Ambulatory Visit: Payer: Self-pay | Admitting: Internal Medicine

## 2020-06-19 ENCOUNTER — Telehealth: Payer: Self-pay

## 2020-06-19 ENCOUNTER — Telehealth: Payer: Self-pay | Admitting: *Deleted

## 2020-06-19 ENCOUNTER — Other Ambulatory Visit: Payer: Self-pay

## 2020-06-19 NOTE — Telephone Encounter (Signed)
Called pt could not leave VM. Mailbox full. Pt has an appt 07/10/2020. Pt can discuss 90 day supply at visit 07/10/2020. Pt needs office visit before refills can be sent in.  Will route result note to [facility] Nurse Triage for follow up when patient returns call to clinic. Nurse may give results to patient if they return call. CRM created for this message.   KP

## 2020-06-19 NOTE — Telephone Encounter (Signed)
Pt returned call. Expresses frustration. States "I know how to work my blood sugar machine, that is not the problem."  States he is on Praluent 75mg , injectable. States is following instructions for use; 1st order, 1st pen "Malfunctioned." Second order of pens "Happened again." Is concerned reason A1C elevated is because he didn't receive those 2 doses due to malfunction of pens. States notified pharmacy, advised to notify manufacture which he states he did. States he was told they would not send additional pens. States off Repatha as insurance wouldn't pay. He states his concern is other meds with be increased when the issue is with malfunction of pen and "Not receiving those doses."  CB# [phone]

## 2020-06-19 NOTE — Telephone Encounter (Signed)
Spoke to pt let him know that Dr. Judithann Graves does not prescribe Praluent for him. Pt gets this medication from [facility]. Pt needs to call and let them know his questions and concerns. Viewed notes from [facility] they told pt to come in the office on his next injection so they could go over how to do injection. Pt stated that "it was a misunderstanding when talking to someone at [facility] that he is 65 years old and he can read the instructions that the injections are malfunctioning". Pt verbalized understanding.  KP

## 2020-06-19 NOTE — Telephone Encounter (Signed)
Called pt could not leave VM. Mailbox was full. Unsure of what testing supplies pt has. If pt wants to know how to use blood sugar machine pt needs to take the device back to pharmacy where he purchased the device and they can tell pt how to use device.  Will route result note to [facility] Nurse Triage for follow up when patient returns call to clinic. Nurse may give results to patient if they return call. CRM created for this message.   KP

## 2020-06-19 NOTE — Telephone Encounter (Signed)
Copied from CRM [id number]. Topic: General - Call Back - No Documentation >> Jun 19, 2020  3:04 PM Randol Kern wrote: Reason for CRM: Requesting a call back, pt needs help with testing supplies. He is has questions and wants to make sure he is using device/equipment properly.   Best contact: [phone]

## 2020-06-19 NOTE — Telephone Encounter (Signed)
Copied from CRM [id number]. Topic: Quick Communication - Rx Refill/Question >> Jun 19, 2020  2:58 PM Randol Kern wrote: Pt is leaving the country in July and wants all of his prescriptions changed to 90 day supply. Was unable to disclose the names of the medications because he says he does not know how to pronounce them. States that PCP is aware of his list because he has been a patient for 30 years.   [id number] [place] - [address] [place] [address] Phone: [phone] Fax: [phone]  Best contact: [phone]

## 2020-06-20 DIAGNOSIS — I2510 Atherosclerotic heart disease of native coronary artery without angina pectoris: Secondary | ICD-10-CM | POA: Diagnosis not present

## 2020-06-20 DIAGNOSIS — R0789 Other chest pain: Secondary | ICD-10-CM | POA: Diagnosis not present

## 2020-06-20 DIAGNOSIS — R0600 Dyspnea, unspecified: Secondary | ICD-10-CM | POA: Diagnosis not present

## 2020-06-20 LAB — BASIC METABOLIC PANEL
BUN: 25 — AB (ref 4–21)
CO2: 22 (ref 13–22)
Chloride: 107 (ref 99–108)
Creatinine: 1.1 (ref 0.6–1.3)
Glucose: 98
Potassium: 4.3 (ref 3.4–5.3)
Sodium: 138 (ref 137–147)

## 2020-06-20 LAB — COMPREHENSIVE METABOLIC PANEL: GFR calc non Af Amer: 75

## 2020-06-20 LAB — CBC AND DIFFERENTIAL
HCT: 50 (ref 41–53)
Hemoglobin: 13.8 (ref 13.5–17.5)
Platelets: 156 (ref 150–399)
WBC: 5.8

## 2020-06-20 NOTE — Telephone Encounter (Signed)
Patient advised PCP got his message and will review all medications in preparation for his trip at his appointment at the end of the month.

## 2020-06-20 NOTE — Telephone Encounter (Signed)
For your information. Patient has appointment scheduled 07/10/2020.

## 2020-06-20 NOTE — Telephone Encounter (Signed)
Patient states using the devices is not the issue- he will speak to provider when in office at next appointment.

## 2020-06-25 DIAGNOSIS — Z6836 Body mass index (BMI) 36.0-36.9, adult: Secondary | ICD-10-CM | POA: Diagnosis not present

## 2020-06-25 DIAGNOSIS — I25718 Atherosclerosis of autologous vein coronary artery bypass graft(s) with other forms of angina pectoris: Secondary | ICD-10-CM | POA: Diagnosis not present

## 2020-06-25 DIAGNOSIS — I25728 Atherosclerosis of autologous artery coronary artery bypass graft(s) with other forms of angina pectoris: Secondary | ICD-10-CM | POA: Diagnosis not present

## 2020-06-25 DIAGNOSIS — I25118 Atherosclerotic heart disease of native coronary artery with other forms of angina pectoris: Secondary | ICD-10-CM | POA: Diagnosis not present

## 2020-06-25 DIAGNOSIS — Z955 Presence of coronary angioplasty implant and graft: Secondary | ICD-10-CM | POA: Diagnosis not present

## 2020-06-25 DIAGNOSIS — T82857A Stenosis of cardiac prosthetic devices, implants and grafts, initial encounter: Secondary | ICD-10-CM | POA: Diagnosis not present

## 2020-06-25 DIAGNOSIS — Z7902 Long term (current) use of antithrombotics/antiplatelets: Secondary | ICD-10-CM | POA: Diagnosis not present

## 2020-06-25 DIAGNOSIS — Z951 Presence of aortocoronary bypass graft: Secondary | ICD-10-CM | POA: Diagnosis not present

## 2020-06-25 DIAGNOSIS — I2582 Chronic total occlusion of coronary artery: Secondary | ICD-10-CM | POA: Diagnosis not present

## 2020-06-25 DIAGNOSIS — I10 Essential (primary) hypertension: Secondary | ICD-10-CM | POA: Diagnosis not present

## 2020-06-25 DIAGNOSIS — E785 Hyperlipidemia, unspecified: Secondary | ICD-10-CM | POA: Diagnosis not present

## 2020-06-25 DIAGNOSIS — E119 Type 2 diabetes mellitus without complications: Secondary | ICD-10-CM | POA: Diagnosis not present

## 2020-07-10 ENCOUNTER — Ambulatory Visit: Payer: 59 | Admitting: Internal Medicine

## 2020-07-15 ENCOUNTER — Other Ambulatory Visit: Payer: Self-pay | Admitting: Internal Medicine

## 2020-07-15 DIAGNOSIS — I10 Essential (primary) hypertension: Secondary | ICD-10-CM

## 2020-07-16 ENCOUNTER — Other Ambulatory Visit: Payer: Self-pay | Admitting: Internal Medicine

## 2020-07-16 NOTE — Telephone Encounter (Signed)
Requested Prescriptions  Pending Prescriptions Disp Refills  . JANUMET XR [phone] MG TB24 [Pharmacy Med Name: JANUMET XR 100-1,000 MG TABLET] 90 tablet 0    Sig: TAKE 1 TABLET BY MOUTH EVERY DAY     Endocrinology:  Diabetes - Biguanide + DPP-4 Inhibitor Combos Failed - 07/16/2020  1:06 AM      Failed - HBA1C is between 0 and 7.9 and within 180 days    Hgb A1c MFr Bld  Date Value Ref Range Status  02/29/2020 8.2 (H) 4.8 - 5.6 % Final    Comment:             Prediabetes: 5.7 - 6.4          Diabetes: >6.4          Glycemic control for adults with diabetes: <7.0          Passed - Cr in normal range and within 360 days    Creatinine, Ser  Date Value Ref Range Status  02/29/2020 1.10 0.76 - 1.27 mg/dL Final         Passed - eGFR in normal range and within 360 days    GFR calc Af Amer  Date Value Ref Range Status  02/29/2020 82 >59 mL/min/1.73 Final    Comment:    **In accordance with recommendations from the NKF-ASN Task force,**   [facility] is in the process of updating its eGFR calculation to the   2021 CKD-EPI creatinine equation that estimates kidney function   without a race variable.    GFR calc non Af Amer  Date Value Ref Range Status  02/29/2020 71 >59 mL/min/1.73 Final         Passed - Valid encounter within last 6 months    Recent Outpatient Visits          4 months ago Type II diabetes mellitus with complication [facility])   [facility] Glean Hess, MD   9 months ago Annual physical exam   [facility] Glean Hess, MD   1 year ago Type II diabetes mellitus with complication [facility])   [facility] Glean Hess, MD   1 year ago Pharyngitis, unspecified etiology   [facility] Glean Hess, MD   1 year ago Annual physical exam   [facility] Glean Hess, MD      Future Appointments            In 2 weeks Army Melia Jesse Sans, MD [facility], [place]   In 4 months Army Melia Jesse Sans, MD [facility], [facility]

## 2020-07-20 HISTORY — PX: CORONARY ANGIOPLASTY: SHX604

## 2020-07-23 ENCOUNTER — Other Ambulatory Visit: Payer: Self-pay | Admitting: Internal Medicine

## 2020-07-23 DIAGNOSIS — I10 Essential (primary) hypertension: Secondary | ICD-10-CM

## 2020-08-01 ENCOUNTER — Ambulatory Visit: Payer: 59 | Admitting: Internal Medicine

## 2020-08-20 ENCOUNTER — Other Ambulatory Visit: Payer: Self-pay | Admitting: Internal Medicine

## 2020-08-21 ENCOUNTER — Other Ambulatory Visit: Payer: Self-pay | Admitting: Internal Medicine

## 2020-08-21 NOTE — Telephone Encounter (Signed)
Pt called and stated he was advised by [facility] pharmacist to have pcp send Rx for 1 yr supply/ since pt has membership he can get them when needed at lower cost and some for free / please advise

## 2020-08-25 ENCOUNTER — Telehealth: Payer: Self-pay

## 2020-08-25 NOTE — Telephone Encounter (Unsigned)
Copied from CRM [id number]. Topic: General - Other >> Aug 25, 2020  1:19 PM Mata, Wyatt wrote: Reason for CRM: Pt requests call back from Dr. Karn Cassis nurse regarding his request for 90 day supply of all his medications. Pt stated he has been requesting this for some time now but his pharmacy advised that the request needs to be approved by his doctor. Pt stated he will be going out of the country in July through August so he will need all his prescriptions to be written for 90 days so he has enough medication while out of the country. Pt requests call back

## 2020-08-25 NOTE — Telephone Encounter (Signed)
Patients mailbox is full and cannot leave him a message. He has an appointment coming up on 06/10th. When he comes to this appointment this is when he will get a 90 day refill sent in. We have not seen him since December and we are supposed to see him every 4 months before we can refill medications,  Thank you.

## 2020-08-29 ENCOUNTER — Ambulatory Visit (INDEPENDENT_AMBULATORY_CARE_PROVIDER_SITE_OTHER): Payer: 59 | Admitting: Internal Medicine

## 2020-08-29 ENCOUNTER — Other Ambulatory Visit: Payer: Self-pay

## 2020-08-29 ENCOUNTER — Encounter: Payer: Self-pay | Admitting: Internal Medicine

## 2020-08-29 VITALS — BP 118/82 | HR 67 | Temp 97.9°F | Ht 71.0 in | Wt 258.0 lb

## 2020-08-29 DIAGNOSIS — E118 Type 2 diabetes mellitus with unspecified complications: Secondary | ICD-10-CM

## 2020-08-29 DIAGNOSIS — E785 Hyperlipidemia, unspecified: Secondary | ICD-10-CM

## 2020-08-29 DIAGNOSIS — M79671 Pain in right foot: Secondary | ICD-10-CM | POA: Diagnosis not present

## 2020-08-29 DIAGNOSIS — I10 Essential (primary) hypertension: Secondary | ICD-10-CM

## 2020-08-29 DIAGNOSIS — G8929 Other chronic pain: Secondary | ICD-10-CM

## 2020-08-29 DIAGNOSIS — I714 Abdominal aortic aneurysm, without rupture, unspecified: Secondary | ICD-10-CM

## 2020-08-29 DIAGNOSIS — I25709 Atherosclerosis of coronary artery bypass graft(s), unspecified, with unspecified angina pectoris: Secondary | ICD-10-CM | POA: Diagnosis not present

## 2020-08-29 DIAGNOSIS — E1169 Type 2 diabetes mellitus with other specified complication: Secondary | ICD-10-CM

## 2020-08-29 MED ORDER — EMPAGLIFLOZIN 25 MG PO TABS: 25.0000 mg | ORAL_TABLET | Freq: Every day | ORAL | 3 refills | Status: DC

## 2020-08-29 MED ORDER — AMLODIPINE BESYLATE 5 MG PO TABS
1.0000 | ORAL_TABLET | Freq: Every day | ORAL | 3 refills | Status: DC
Start: 2020-08-29 — End: 2020-09-24

## 2020-08-29 MED ORDER — SILDENAFIL CITRATE 100 MG PO TABS: 100.0000 mg | ORAL_TABLET | ORAL | 12 refills | Status: DC | PRN

## 2020-08-29 MED ORDER — METOPROLOL SUCCINATE ER 100 MG PO TB24: 100.0000 mg | ORAL_TABLET | Freq: Two times a day (BID) | ORAL | 3 refills | Status: DC

## 2020-08-29 MED ORDER — FENOFIBRIC ACID 135 MG PO CPDR: 1.0000 | DELAYED_RELEASE_CAPSULE | Freq: Every day | ORAL | 3 refills | Status: DC

## 2020-08-29 MED ORDER — ATORVASTATIN CALCIUM 40 MG PO TABS: 40.0000 mg | ORAL_TABLET | Freq: Every day | ORAL | 3 refills | Status: DC

## 2020-08-29 MED ORDER — LISINOPRIL 10 MG PO TABS: 1.0000 | ORAL_TABLET | Freq: Every day | ORAL | 3 refills | Status: DC

## 2020-08-29 MED ORDER — JANUMET XR 100-1000 MG PO TB24: 1.0000 | ORAL_TABLET | Freq: Every day | ORAL | 3 refills | Status: DC

## 2020-08-29 MED ORDER — NIACIN ER (ANTIHYPERLIPIDEMIC) 1000 MG PO TBCR
1000.0000 mg | EXTENDED_RELEASE_TABLET | Freq: Every day | ORAL | 3 refills | Status: DC
Start: 2020-08-29 — End: 2020-09-24

## 2020-08-29 MED ORDER — GABAPENTIN 300 MG PO CAPS: ORAL_CAPSULE | ORAL | 3 refills | Status: DC

## 2020-08-29 NOTE — Progress Notes (Signed)
Date:  08/29/2020   Name:  Wyatt Mata   DOB:  13-Apr-1955   MRN:  [id number]   Chief Complaint: Diabetes (Foot exam )  Hypertension This is a chronic problem. The problem is controlled. Pertinent negatives include no chest pain, headaches, palpitations or shortness of breath. Past treatments include calcium channel blockers, ACE inhibitors and beta blockers. The current treatment provides significant improvement. There are no compliance problems.  Hypertensive end-organ damage includes CAD/MI.  Diabetes He presents for his follow-up diabetic visit. He has type 2 diabetes mellitus. Pertinent negatives for hypoglycemia include no headaches or tremors. Pertinent negatives for diabetes include no chest pain, no fatigue, no polydipsia and no polyuria. Current diabetic treatment includes oral agent (triple therapy) (janumet and jardiance). His weight is stable.  Hyperlipidemia The problem is controlled. Pertinent negatives include no chest pain or shortness of breath. Current antihyperlipidemic treatment includes nicotinic acid and fibric acid derivatives (Praluent).   Lab Results  Component Value Date   CREATININE 1.1 06/20/2020   BUN 25 (A) 06/20/2020   NA 138 06/20/2020   K 4.3 06/20/2020   CL 107 06/20/2020   CO2 22 06/20/2020   Lab Results  Component Value Date   CHOL 165 01/09/2020   HDL 30 (A) 01/09/2020   LDLCALC 61 01/09/2020   TRIG 369 (A) 01/09/2020   CHOLHDL 2.4 10/19/2019   Lab Results  Component Value Date   TSH 1.150 10/13/2018   Lab Results  Component Value Date   HGBA1C 8.2 (H) 02/29/2020   Lab Results  Component Value Date   WBC 5.8 06/20/2020   HGB 13.8 06/20/2020   HCT 50 06/20/2020   MCV 90 10/19/2019   PLT 156 06/20/2020   Lab Results  Component Value Date   ALT 31 10/19/2019   AST 23 10/19/2019   ALKPHOS 54 10/19/2019   BILITOT 0.6 10/19/2019     Review of Systems  Constitutional:  Negative for appetite change, fatigue and unexpected  weight change.  Eyes:  Negative for visual disturbance.  Respiratory:  Negative for cough, shortness of breath and wheezing.   Cardiovascular:  Negative for chest pain, palpitations and leg swelling.  Gastrointestinal:  Negative for abdominal pain and blood in stool.  Endocrine: Negative for polydipsia and polyuria.  Genitourinary:  Negative for dysuria and hematuria.  Skin:  Negative for color change and rash.  Neurological:  Negative for tremors, numbness and headaches.  Psychiatric/Behavioral:  Negative for dysphoric mood.    Patient Active Problem List   Diagnosis Date Noted   Neck pain 02/29/2020   Coronary artery disease involving coronary bypass graft of native heart with angina pectoris (HCC) 10/13/2018   AAA (abdominal aortic aneurysm) (HCC) 02/12/2018   Spondylolisthesis of lumbar region 11/30/2017   Lumbar radiculopathy 11/11/2017   DDD (degenerative disc disease), lumbar 10/10/2017   Type II diabetes mellitus with complication (HCC) 10/10/2015   Low serum testosterone 10/10/2015   Hematuria, gross 10/15/2014   Allergic rhinitis 08/05/2014   CAD in native artery 08/05/2014   Hyperlipidemia associated with type 2 diabetes mellitus (HCC) 08/05/2014   Epididymal pain 08/05/2014   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   Essential (primary) hypertension 08/05/2014   Acid reflux 08/05/2014   History of colon polyps 08/05/2014   Obstructive apnea 08/05/2014   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    Allergies  Allergen Reactions   Cyclobenzaprine Other (See Comments)    Abdominal pain  Past Surgical History:  Procedure Laterality Date   COLONOSCOPY  08/16/2014   tubular adenoma   COLONOSCOPY  08/24/2017   one polyp - repeat 5 yrs   CORONARY ANGIOPLASTY WITH STENT PLACEMENT  2013   CORONARY ANGIOPLASTY WITH STENT PLACEMENT  01/2015   2 stents  - post circ and OM2   CORONARY ARTERY BYPASS GRAFT  2004     Social History   Tobacco Use   Smoking status: Former    Pack years: 0.00   Smokeless tobacco: Never  Vaping Use   Vaping Use: Never used  Substance Use Topics   Alcohol use: No    Alcohol/week: 0.0 standard drinks   Drug use: No     Medication list has been reviewed and updated.  Current Meds  Medication Sig   amLODipine (NORVASC) 5 MG tablet TAKE 1 TABLET BY MOUTH EVERY DAY   Aspirin-Acetaminophen-Caffeine 500-325-65 MG PACK Take 1 packet by mouth 2 (two) times daily.   atorvastatin (LIPITOR) 40 MG tablet TAKE 1 TABLET BY MOUTH EVERY DAY AT 6 PM   JANUMET XR [phone] MG TB24 TAKE 1 TABLET BY MOUTH EVERY DAY   JARDIANCE 25 MG TABS tablet TAKE 1 TABLET BY MOUTH EVERY DAY   lisinopril (ZESTRIL) 10 MG tablet TAKE 1 TABLET BY MOUTH EVERY DAY   metoprolol succinate (TOPROL-XL) 100 MG 24 hr tablet TAKE 1 TABLET BY MOUTH TWICE A DAY   sildenafil (VIAGRA) 100 MG tablet TAKE ONE-HALF TO ONE TABLET BY MOUTH DAILY AS NEEDED FOR ERECTILE DYSFUNCTION    PHQ 2/9 Scores 08/29/2020 02/29/2020 03/22/2019 01/04/2019  PHQ - 2 Score 0 0 0 0  PHQ- 9 Score 0 2 - -    GAD 7 : Generalized Anxiety Score 08/29/2020 02/29/2020  Nervous, Anxious, on Edge 0 0  Control/stop worrying 0 0  Worry too much - different things 0 0  Trouble relaxing 0 0  Restless 0 0  Easily annoyed or irritable 0 0  Afraid - awful might happen 0 0  Total GAD 7 Score 0 0    BP Readings from Last 3 Encounters:  08/29/20 118/82  02/29/20 100/64  10/19/19 126/78    Physical Exam Vitals and nursing note reviewed.  Constitutional:      General: He is not in acute distress.    Appearance: He is well-developed.  HENT:     Head: Normocephalic and atraumatic.  Cardiovascular:     Rate and Rhythm: Normal rate and regular rhythm.  Pulmonary:     Effort: Pulmonary effort is normal. No respiratory distress.     Breath sounds: No wheezing or rhonchi.  Musculoskeletal:     Right lower leg: No edema.     Left lower  leg: No edema.  Skin:    General: Skin is warm and dry.     Capillary Refill: Capillary refill takes less than 2 seconds.     Findings: No rash.  Neurological:     General: No focal deficit present.     Mental Status: He is alert and oriented to person, place, and time.  Psychiatric:        Mood and Affect: Mood normal.        Behavior: Behavior normal.    Wt Readings from Last 3 Encounters:  08/29/20 258 lb (117 kg)  02/29/20 269 lb (122 kg)  10/19/19 (!) 267 lb (121.1 kg)    BP 118/82   Pulse 67   Temp 97.9 F (36.6 C) (Oral)  Ht 5\' 11"  (1.803 m)   Wt 258 lb (117 kg)   SpO2 97%   BMI 35.98 kg/m   Assessment and Plan: 1. Essential (primary) hypertension Clinically stable exam with well controlled BP. Tolerating medications without side effects at this time. Pt to continue current regimen and low sodium diet; benefits of regular exercise as able discussed. - Comprehensive metabolic panel - amLODipine (NORVASC) 5 MG tablet; Take 1 tablet (5 mg total) by mouth daily.  Dispense: 90 tablet; Refill: 3 - lisinopril (ZESTRIL) 10 MG tablet; Take 1 tablet (10 mg total) by mouth daily.  Dispense: 90 tablet; Refill: 3 - metoprolol succinate (TOPROL-XL) 100 MG 24 hr tablet; Take 1 tablet (100 mg total) by mouth 2 (two) times daily. Take with or immediately following a meal.  Dispense: 180 tablet; Refill: 3  2. Type II diabetes mellitus with complication (HCC) He has lost 10 lbs since his last visit - he is pleased with his progress. He does not check BS. Continue current regimen and will advise after labs return - Hemoglobin A1c - SitaGLIPtin-MetFORMIN HCl (JANUMET XR) [phone] MG TB24; Take 1 tablet by mouth daily.  Dispense: 90 tablet; Refill: 3 - empagliflozin (JARDIANCE) 25 MG TABS tablet; Take 1 tablet (25 mg total) by mouth daily.  Dispense: 90 tablet; Refill: 3  3. Hyperlipidemia associated with type 2 diabetes mellitus (HCC) Fairly well controlled on multiple agents -  Lipid panel - niacin (NIASPAN) 1000 MG CR tablet; Take 1 tablet (1,000 mg total) by mouth at bedtime.  Dispense: 90 tablet; Refill: 3 - atorvastatin (LIPITOR) 40 MG tablet; Take 1 tablet (40 mg total) by mouth daily.  Dispense: 90 tablet; Refill: 3 - Choline Fenofibrate (FENOFIBRIC ACID) 135 MG CPDR; Take 1 tablet by mouth daily.  Dispense: 90 capsule; Refill: 3  4. Chronic foot pain, right - gabapentin (NEURONTIN) 300 MG capsule; TAKE 1 CAPSULE BY MOUTH FOUR TIMES A DAY  Dispense: 360 capsule; Refill: 3  5. Abdominal aortic aneurysm (AAA) without rupture (HCC) Followed by Cardiology  6. Coronary artery disease involving coronary bypass graft of native heart with angina pectoris (HCC) Followed by Cardiology - recent PTCA without stent placed Doing great; on Plavix x 45 days   Partially dictated using . Any errors are unintentional.  Animal nutritionist, MD [facility]  08/29/2020

## 2020-08-30 LAB — COMPREHENSIVE METABOLIC PANEL
ALT: 29 IU/L (ref 0–44)
AST: 21 IU/L (ref 0–40)
Albumin/Globulin Ratio: 1.8 (ref 1.2–2.2)
Albumin: 4.6 g/dL (ref 3.8–4.8)
Alkaline Phosphatase: 50 IU/L (ref 44–121)
BUN/Creatinine Ratio: 23 (ref 10–24)
BUN: 27 mg/dL (ref 8–27)
Bilirubin Total: 0.4 mg/dL (ref 0.0–1.2)
CO2: 21 mmol/L (ref 20–29)
Calcium: 9.5 mg/dL (ref 8.6–10.2)
Chloride: 101 mmol/L (ref 96–106)
Creatinine, Ser: 1.19 mg/dL (ref 0.76–1.27)
Globulin, Total: 2.6 g/dL (ref 1.5–4.5)
Glucose: 108 mg/dL — ABNORMAL HIGH (ref 65–99)
Potassium: 5 mmol/L (ref 3.5–5.2)
Sodium: 137 mmol/L (ref 134–144)
Total Protein: 7.2 g/dL (ref 6.0–8.5)
eGFR: 68 mL/min/{1.73_m2} (ref >59)

## 2020-08-30 LAB — HEMOGLOBIN A1C
Est. average glucose Bld gHb Est-mCnc: 160 mg/dL
Hgb A1c MFr Bld: 7.2 % — ABNORMAL HIGH (ref 4.8–5.6)

## 2020-08-30 LAB — LIPID PANEL
Chol/HDL Ratio: 7.3 ratio — ABNORMAL HIGH (ref 0.0–5.0)
Cholesterol, Total: 212 mg/dL — ABNORMAL HIGH (ref 100–199)
HDL: 29 mg/dL — ABNORMAL LOW (ref >39)
LDL Chol Calc (NIH): 133 mg/dL — ABNORMAL HIGH (ref 0–99)
Triglycerides: 277 mg/dL — ABNORMAL HIGH (ref 0–149)
VLDL Cholesterol Cal: 50 mg/dL — ABNORMAL HIGH (ref 5–40)

## 2020-09-01 ENCOUNTER — Other Ambulatory Visit: Payer: Self-pay

## 2020-09-05 DIAGNOSIS — I25710 Atherosclerosis of autologous vein coronary artery bypass graft(s) with unstable angina pectoris: Secondary | ICD-10-CM | POA: Diagnosis not present

## 2020-09-05 DIAGNOSIS — Z9861 Coronary angioplasty status: Secondary | ICD-10-CM | POA: Diagnosis not present

## 2020-09-05 DIAGNOSIS — I2510 Atherosclerotic heart disease of native coronary artery without angina pectoris: Secondary | ICD-10-CM | POA: Diagnosis not present

## 2020-09-11 ENCOUNTER — Other Ambulatory Visit: Payer: Self-pay | Admitting: Internal Medicine

## 2020-09-11 DIAGNOSIS — E118 Type 2 diabetes mellitus with unspecified complications: Secondary | ICD-10-CM

## 2020-09-11 DIAGNOSIS — I10 Essential (primary) hypertension: Secondary | ICD-10-CM

## 2020-09-11 DIAGNOSIS — E785 Hyperlipidemia, unspecified: Secondary | ICD-10-CM

## 2020-09-15 ENCOUNTER — Other Ambulatory Visit: Payer: Self-pay | Admitting: Internal Medicine

## 2020-09-15 DIAGNOSIS — E1169 Type 2 diabetes mellitus with other specified complication: Secondary | ICD-10-CM

## 2020-09-24 ENCOUNTER — Other Ambulatory Visit: Payer: Self-pay | Admitting: Internal Medicine

## 2020-09-24 DIAGNOSIS — M79671 Pain in right foot: Secondary | ICD-10-CM

## 2020-09-24 DIAGNOSIS — I10 Essential (primary) hypertension: Secondary | ICD-10-CM

## 2020-09-24 DIAGNOSIS — E785 Hyperlipidemia, unspecified: Secondary | ICD-10-CM

## 2020-09-24 DIAGNOSIS — E118 Type 2 diabetes mellitus with unspecified complications: Secondary | ICD-10-CM

## 2020-09-24 MED ORDER — ATORVASTATIN CALCIUM 40 MG PO TABS: 40.0000 mg | ORAL_TABLET | Freq: Every day | ORAL | 3 refills | Status: DC

## 2020-09-24 MED ORDER — EMPAGLIFLOZIN 25 MG PO TABS: 25.0000 mg | ORAL_TABLET | Freq: Every day | ORAL | 3 refills | Status: DC

## 2020-09-24 MED ORDER — GABAPENTIN 300 MG PO CAPS
ORAL_CAPSULE | ORAL | 3 refills | Status: DC
Start: 2020-09-24 — End: 2022-03-24

## 2020-09-24 MED ORDER — FENOFIBRIC ACID 135 MG PO CPDR: 1.0000 | DELAYED_RELEASE_CAPSULE | Freq: Every day | ORAL | 3 refills | Status: DC

## 2020-09-24 MED ORDER — AMLODIPINE BESYLATE 5 MG PO TABS: 5.0000 mg | ORAL_TABLET | Freq: Every day | ORAL | 3 refills | Status: DC

## 2020-09-24 MED ORDER — METOPROLOL SUCCINATE ER 100 MG PO TB24: 100.0000 mg | ORAL_TABLET | Freq: Two times a day (BID) | ORAL | 3 refills | Status: DC

## 2020-09-24 MED ORDER — JANUMET XR 100-1000 MG PO TB24: 1.0000 | ORAL_TABLET | Freq: Every day | ORAL | 3 refills | Status: DC

## 2020-09-24 MED ORDER — LISINOPRIL 10 MG PO TABS: 10.0000 mg | ORAL_TABLET | Freq: Every day | ORAL | 3 refills | Status: DC

## 2020-09-24 MED ORDER — NIACIN ER (ANTIHYPERLIPIDEMIC) 1000 MG PO TBCR: 1000.0000 mg | EXTENDED_RELEASE_TABLET | Freq: Every day | ORAL | 3 refills | Status: DC

## 2020-09-24 MED ORDER — SILDENAFIL CITRATE 100 MG PO TABS: 100.0000 mg | ORAL_TABLET | ORAL | 12 refills | Status: DC | PRN

## 2020-09-24 NOTE — Telephone Encounter (Signed)
Pt called in for assistance. Pt says that his Rx medications (Written on 6/10/222) were sent to the incorrect pharmacy. Pt would like to have his Rx sent to the correct pharmacy below. Pt is frustrated because he says that he is leaving to go out of the country and need these meds.    Please assist pt further.   Pharmacy:  [id number] - [place], [place] - [address] Phone:  [phone]  Fax:  [phone]

## 2020-10-24 ENCOUNTER — Encounter: Payer: 59 | Admitting: Internal Medicine

## 2020-10-31 DIAGNOSIS — I150 Renovascular hypertension: Secondary | ICD-10-CM | POA: Diagnosis not present

## 2020-10-31 DIAGNOSIS — I2510 Atherosclerotic heart disease of native coronary artery without angina pectoris: Secondary | ICD-10-CM | POA: Diagnosis not present

## 2020-11-07 ENCOUNTER — Encounter: Payer: 59 | Admitting: Internal Medicine

## 2020-11-19 ENCOUNTER — Ambulatory Visit: Payer: 59 | Admitting: Internal Medicine

## 2020-11-19 ENCOUNTER — Encounter: Payer: Self-pay | Admitting: Internal Medicine

## 2020-11-19 ENCOUNTER — Other Ambulatory Visit: Payer: Self-pay

## 2020-11-19 VITALS — BP 118/58 | HR 68 | Temp 98.1°F | Ht 71.0 in | Wt 259.0 lb

## 2020-11-19 DIAGNOSIS — S39011A Strain of muscle, fascia and tendon of abdomen, initial encounter: Secondary | ICD-10-CM | POA: Diagnosis not present

## 2020-11-19 DIAGNOSIS — J029 Acute pharyngitis, unspecified: Secondary | ICD-10-CM

## 2020-11-19 DIAGNOSIS — H60501 Unspecified acute noninfective otitis externa, right ear: Secondary | ICD-10-CM

## 2020-11-19 DIAGNOSIS — R5383 Other fatigue: Secondary | ICD-10-CM | POA: Diagnosis not present

## 2020-11-19 DIAGNOSIS — E118 Type 2 diabetes mellitus with unspecified complications: Secondary | ICD-10-CM

## 2020-11-19 MED ORDER — NEOMYCIN-POLYMYXIN-HC 3.5-10000-1 OT SOLN
3.0000 [drp] | Freq: Four times a day (QID) | OTIC | 0 refills | Status: DC
Start: 2020-11-19 — End: 2021-04-10

## 2020-11-19 NOTE — Patient Instructions (Signed)
Take Claritin or Allegra daily for 10 days

## 2020-11-19 NOTE — Progress Notes (Signed)
Date:  11/19/2020   Name:  Wyatt Mata   DOB:  [birth year]   MRN:  [id number]   Chief Complaint: Sore Throat (X3 days, getting worse, no other symptoms )  Sore Throat  This is a new problem. The current episode started in the past 7 days. The problem has been unchanged. There has been no fever. The pain is mild. Associated symptoms include abdominal pain. Pertinent negatives include no coughing, diarrhea, ear discharge, ear pain, shortness of breath or vomiting. He has had no exposure to strep or mono. He has tried nothing for the symptoms.  Abdominal Pain This is a chronic problem. The current episode started more than 1 year ago. The onset quality is undetermined. The problem has been waxing and waning. The pain is located in the RLQ. The pain is mild. The quality of the pain is dull. Pertinent negatives include no constipation, diarrhea, dysuria, fever, frequency, melena, nausea or vomiting. The pain is aggravated by movement.   Lab Results  Component Value Date   CREATININE 1.19 08/29/2020   BUN 27 08/29/2020   NA 137 08/29/2020   K 5.0 08/29/2020   CL 101 08/29/2020   CO2 21 08/29/2020   Lab Results  Component Value Date   CHOL 212 (H) 08/29/2020   HDL 29 (L) 08/29/2020   LDLCALC 133 (H) 08/29/2020   TRIG 277 (H) 08/29/2020   CHOLHDL 7.3 (H) 08/29/2020   Lab Results  Component Value Date   TSH 1.150 10/13/2018   Lab Results  Component Value Date   HGBA1C 7.2 (H) 08/29/2020   Lab Results  Component Value Date   WBC 5.8 06/20/2020   HGB 13.8 06/20/2020   HCT 50 06/20/2020   MCV 90 10/19/2019   PLT 156 06/20/2020   Lab Results  Component Value Date   ALT 29 08/29/2020   AST 21 08/29/2020   ALKPHOS 50 08/29/2020   BILITOT 0.4 08/29/2020     Review of Systems  Constitutional:  Positive for fatigue. Negative for chills and fever.  HENT:  Positive for sore throat. Negative for ear discharge and ear pain.   Respiratory:  Negative for cough, chest tightness  and shortness of breath.   Gastrointestinal:  Positive for abdominal pain. Negative for constipation, diarrhea, melena, nausea and vomiting.  Genitourinary:  Negative for dysuria and frequency.  Psychiatric/Behavioral:  Negative for dysphoric mood and sleep disturbance. The patient is not nervous/anxious.    Patient Active Problem List   Diagnosis Date Noted   Neck pain 02/29/2020   Coronary artery disease involving coronary bypass graft of native heart with angina pectoris (HCC) 10/13/2018   AAA (abdominal aortic aneurysm) (HCC) 02/12/2018   Spondylolisthesis of lumbar region 11/30/2017   Lumbar radiculopathy 11/11/2017   DDD (degenerative disc disease), lumbar 10/10/2017   Type II diabetes mellitus with complication (HCC) 10/10/2015   Low serum testosterone 10/10/2015   Hematuria, gross 10/15/2014   Allergic rhinitis 08/05/2014   CAD in native artery 08/05/2014   Hyperlipidemia associated with type 2 diabetes mellitus (HCC) 08/05/2014   Epididymal pain 08/05/2014   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   Essential (primary) hypertension 08/05/2014   Acid reflux 08/05/2014   History of colon polyps 08/05/2014   Obstructive apnea 08/05/2014   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    Allergies  Allergen Reactions   Cyclobenzaprine Other (See Comments)    Abdominal pain    Past Surgical History:  Procedure Laterality  Date   COLONOSCOPY  08/16/2014   tubular adenoma   COLONOSCOPY  08/24/2017   one polyp - repeat 5 yrs   CORONARY ANGIOPLASTY N/A 07/2020   CORONARY ANGIOPLASTY WITH STENT PLACEMENT  2013   CORONARY ANGIOPLASTY WITH STENT PLACEMENT  01/2015   2 stents  - post circ and OM2   CORONARY ARTERY BYPASS GRAFT  2004    Social History   Tobacco Use   Smoking status: Former   Smokeless tobacco: Never  Vaping Use   Vaping Use: Never used  Substance Use Topics   Alcohol use: No    Alcohol/week: 0.0 standard  drinks   Drug use: No     Medication list has been reviewed and updated.  Current Meds  Medication Sig   amLODipine (NORVASC) 5 MG tablet Take 1 tablet (5 mg total) by mouth daily.   Aspirin-Acetaminophen-Caffeine 500-325-65 MG PACK Take 1 packet by mouth 2 (two) times daily. Goodys   atorvastatin (LIPITOR) 40 MG tablet Take 1 tablet (40 mg total) by mouth daily.   Choline Fenofibrate (FENOFIBRIC ACID) 135 MG CPDR Take 1 tablet by mouth daily.   clopidogrel (PLAVIX) 75 MG tablet Take 75 mg by mouth daily.   empagliflozin (JARDIANCE) 25 MG TABS tablet Take 1 tablet (25 mg total) by mouth daily.   Evolocumab (REPATHA SURECLICK) 140 MG/ML SOAJ Inject into the skin.   gabapentin (NEURONTIN) 300 MG capsule TAKE 1 CAPSULE BY MOUTH FOUR TIMES A DAY   lisinopril (ZESTRIL) 10 MG tablet Take 1 tablet (10 mg total) by mouth daily.   metoprolol succinate (TOPROL-XL) 100 MG 24 hr tablet Take 1 tablet (100 mg total) by mouth 2 (two) times daily. Take with or immediately following a meal.   PRALUENT 75 MG/ML SOAJ Inject into the skin.   sildenafil (VIAGRA) 100 MG tablet Take 1 tablet (100 mg total) by mouth as needed for erectile dysfunction.   SitaGLIPtin-MetFORMIN HCl (JANUMET XR) [phone] MG TB24 Take 1 tablet by mouth daily.    PHQ 2/9 Scores 11/19/2020 08/29/2020 02/29/2020 03/22/2019  PHQ - 2 Score 2 0 0 0  PHQ- 9 Score 2 0 2 -    GAD 7 : Generalized Anxiety Score 11/19/2020 08/29/2020 02/29/2020  Nervous, Anxious, on Edge 1 0 0  Control/stop worrying 1 0 0  Worry too much - different things 1 0 0  Trouble relaxing 1 0 0  Restless 1 0 0  Easily annoyed or irritable 1 0 0  Afraid - awful might happen 1 0 0  Total GAD 7 Score 7 0 0    BP Readings from Last 3 Encounters:  11/19/20 (!) 118/58  08/29/20 118/82  02/29/20 100/64    Physical Exam Vitals and nursing note reviewed.  Constitutional:      General: He is not in acute distress.    Appearance: He is well-developed.  HENT:      Head: Normocephalic and atraumatic.     Right Ear: Tympanic membrane is erythematous and retracted.     Left Ear: Tympanic membrane is retracted. Tympanic membrane is not erythematous.     Nose:     Right Sinus: No maxillary sinus tenderness or frontal sinus tenderness.     Left Sinus: No maxillary sinus tenderness or frontal sinus tenderness.     Mouth/Throat:     Tonsils: No tonsillar exudate.  Eyes:     Conjunctiva/sclera: Conjunctivae normal.  Cardiovascular:     Rate and Rhythm: Normal rate and regular rhythm.  Heart sounds: Normal heart sounds. No murmur heard. Pulmonary:     Effort: Pulmonary effort is normal. No respiratory distress.  Abdominal:     General: Abdomen is protuberant. Bowel sounds are normal.     Palpations: Abdomen is soft.     Tenderness: There is no abdominal tenderness.     Hernia: No hernia is present.  Musculoskeletal:     Cervical back: Normal range of motion.  Skin:    General: Skin is warm and dry.     Findings: No rash.  Neurological:     Mental Status: He is alert and oriented to person, place, and time.  Psychiatric:        Mood and Affect: Mood normal.        Behavior: Behavior normal.    Wt Readings from Last 3 Encounters:  11/19/20 259 lb (117.5 kg)  08/29/20 258 lb (117 kg)  02/29/20 269 lb (122 kg)    BP (!) 118/58   Pulse 68   Temp 98.1 F (36.7 C) (Oral)   Ht 5\' 11"  (1.803 m)   Wt 259 lb (117.5 kg)   SpO2 97%   BMI 36.12 kg/m   Assessment and Plan: 1. Pharyngitis, unspecified etiology No obvious cause. Suspect PND Recommend Claritin or Allegra daily.  2. Acute otitis externa of right ear, unspecified type - neomycin-polymyxin-hydrocortisone (CORTISPORIN) OTIC solution; Place 3 drops into the right ear 4 (four) times daily.  Dispense: 10 mL; Refill: 0  3. Fatigue, unspecified type Pt inquires about B12 injections Will consider B12 levels next visit  4. Type II diabetes mellitus with complication (HCC) Fairly well  controlled but on 4 agents He is interested in trying Ozempic.  5. Strain of abdominal wall, initial encounter Suspect his discomfort is related to muscular strain No evidence hernia or other mass -   Partially dictated using . Any errors are unintentional.  Animal nutritionist, MD [facility]  11/19/2020

## 2020-11-26 ENCOUNTER — Ambulatory Visit: Payer: Self-pay | Admitting: *Deleted

## 2020-11-26 ENCOUNTER — Other Ambulatory Visit: Payer: Self-pay | Admitting: Internal Medicine

## 2020-11-26 DIAGNOSIS — J0100 Acute maxillary sinusitis, unspecified: Secondary | ICD-10-CM

## 2020-11-26 MED ORDER — AMOXICILLIN-POT CLAVULANATE 875-125 MG PO TABS
1.0000 | ORAL_TABLET | Freq: Two times a day (BID) | ORAL | 0 refills | Status: AC
Start: 2020-11-26 — End: 2020-12-06

## 2020-11-26 NOTE — Telephone Encounter (Signed)
Please review. Do you want to do a VV?  KP

## 2020-11-26 NOTE — Telephone Encounter (Signed)
Called pt [facility] [address].  [place]

## 2020-11-26 NOTE — Telephone Encounter (Signed)
Summary: Clinical Advice   Patient last seen 11/19/2020 and states symptoms have improved slightly. Patient experiencing head ache, blowing dark yellow  nasal discharged. Patient states the Claritin helps him sleep at night. Patient will be in a meeting until 10am     Attempted to call patient- left message to call office

## 2020-11-26 NOTE — Telephone Encounter (Signed)
Seen on 11/19/20 with sore throat. Put on Claritin which has helped ear and throat. Now with a 4 hour headache (Takes Goodies twice a day so does not take tylenol) and continues congestion with thick yellow nasal discharge all day. No fever, cough, SOB.  Advised NS spray 2-3 times/daily, have personal area with humidifier and cool air temperature in work and home environment. 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 Requesting medication for possible sinus infection since being seen last week.   [facility] [place]     Reason for Disposition  [1] Sinus pain of forehead AND [2] yellow or green nasal discharge  Answer Assessment - Initial Assessment Questions 1. LOCATION: "Where does it hurt?"      Across forehead 2. ONSET: "When did the headache start?" (Minutes, hours or days)      7 am this morning. 3. PATTERN: "Does the pain come and go, or has it been constant since it started?"     constant 4. SEVERITY: "How bad is the pain?" and "What does it keep you from doing?"  (e.g., Scale 1-10; mild, moderate, or severe)   - MILD (1-3): doesn't interfere with normal activities    - MODERATE (4-7): interferes with normal activities or awakens from sleep    - SEVERE (8-10): excruciating pain, unable to do any normal activities        Moderate headache and nasal congestion 5. RECURRENT SYMPTOM: "Have you ever had headaches before?" If Yes, ask: "When was the last time?" and "What happened that time?"      Congestion last week through now 6. CAUSE: "What do you think is causing the headache?"     sinuses 7. MIGRAINE: "Have you been diagnosed with migraine headaches?" If Yes, ask: "Is this headache similar?"      no 8. HEAD INJURY: "Has there been any recent injury to the head?"      no 9. OTHER SYMPTOMS: "Do you have any other symptoms?" (fever, stiff neck, eye pain, sore throat, cold symptoms)     Only headache and nasal congestion 10. PREGNANCY: "Is there any chance you are pregnant?" "When  was your last menstrual period?"       na  Protocols used: [facility]

## 2020-12-02 ENCOUNTER — Other Ambulatory Visit: Payer: Self-pay

## 2020-12-02 ENCOUNTER — Encounter: Payer: Self-pay | Admitting: Internal Medicine

## 2020-12-02 ENCOUNTER — Ambulatory Visit (INDEPENDENT_AMBULATORY_CARE_PROVIDER_SITE_OTHER): Payer: 59 | Admitting: Internal Medicine

## 2020-12-02 VITALS — BP 128/72 | HR 71 | Temp 98.1°F | Ht 71.0 in | Wt 259.0 lb

## 2020-12-02 DIAGNOSIS — E118 Type 2 diabetes mellitus with unspecified complications: Secondary | ICD-10-CM

## 2020-12-02 DIAGNOSIS — Z23 Encounter for immunization: Secondary | ICD-10-CM | POA: Diagnosis not present

## 2020-12-02 DIAGNOSIS — E785 Hyperlipidemia, unspecified: Secondary | ICD-10-CM

## 2020-12-02 DIAGNOSIS — E1169 Type 2 diabetes mellitus with other specified complication: Secondary | ICD-10-CM

## 2020-12-02 DIAGNOSIS — I10 Essential (primary) hypertension: Secondary | ICD-10-CM | POA: Diagnosis not present

## 2020-12-02 DIAGNOSIS — Z125 Encounter for screening for malignant neoplasm of prostate: Secondary | ICD-10-CM

## 2020-12-02 DIAGNOSIS — Z0000 Encounter for general adult medical examination without abnormal findings: Secondary | ICD-10-CM

## 2020-12-02 LAB — POCT URINALYSIS DIPSTICK
Bilirubin, UA: NEGATIVE
Blood, UA: NEGATIVE
Glucose, UA: POSITIVE — AB
Ketones, UA: NEGATIVE
Leukocytes, UA: NEGATIVE
Nitrite, UA: NEGATIVE
Protein, UA: NEGATIVE
Spec Grav, UA: 1.025 (ref 1.010–1.025)
Urobilinogen, UA: 0.2 E.U./dL
pH, UA: 6 (ref 5.0–8.0)

## 2020-12-02 NOTE — Patient Instructions (Signed)
Start Ozempic 0.25 mg weekly x 4 weeks then 0.5 mg x 2 weeks then call for the prescription and instructions.

## 2020-12-02 NOTE — Progress Notes (Signed)
Date:  12/02/2020   Name:  TORIANO AIKEY   DOB:  13-Sep-1955   MRN:  [id number]   Chief Complaint: Annual Exam and Flu Vaccine Wyatt Mata is a 65 y.o. male who presents today for his Complete Annual Exam. He feels well. He reports exercising none. He reports he is sleeping well.   Colonoscopy: 08/2017  Immunization History  Administered Date(s) Administered   Fluad Quad(high Dose 65+) 12/02/2020   Influenza,inj,Quad PF,6+ Mos 01/10/2015, 01/13/2018, 12/08/2018, 01/04/2020   Influenza-Unspecified 01/03/2017   Moderna Sars-Covid-2 Vaccination 05/21/2019, 06/21/2019   Pneumococcal Polysaccharide-23 07/30/2011   Td 09/19/2008, 06/19/2018   Zoster, Live 02/02/2013     Diabetes He presents for his follow-up diabetic visit. He has type 2 diabetes mellitus. His disease course has been stable. Pertinent negatives for hypoglycemia include no dizziness, headaches or nervousness/anxiousness. Pertinent negatives for diabetes include no chest pain and no fatigue. Current diabetic treatment includes oral agent (triple therapy) (jardiance and sitagliptin/metformin). He is compliant with treatment all of the time. His weight is stable. An ACE inhibitor/angiotensin II receptor blocker is being taken. Eye exam is not current.  Hyperlipidemia This is a chronic problem. Pertinent negatives include no chest pain, myalgias or shortness of breath. Treatments tried: Repatha, lipitor.  Hypertension This is a chronic problem. The problem is controlled. Pertinent negatives include no chest pain, headaches, palpitations or shortness of breath. Past treatments include calcium channel blockers, beta blockers and ACE inhibitors. Hypertensive end-organ damage includes CAD/MI.   Lab Results  Component Value Date   CREATININE 1.19 08/29/2020   BUN 27 08/29/2020   NA 137 08/29/2020   K 5.0 08/29/2020   CL 101 08/29/2020   CO2 21 08/29/2020   Lab Results  Component Value Date   CHOL 212 (H) 08/29/2020    HDL 29 (L) 08/29/2020   LDLCALC 133 (H) 08/29/2020   TRIG 277 (H) 08/29/2020   CHOLHDL 7.3 (H) 08/29/2020   Lab Results  Component Value Date   TSH 1.150 10/13/2018   Lab Results  Component Value Date   HGBA1C 7.2 (H) 08/29/2020   Lab Results  Component Value Date   WBC 5.8 06/20/2020   HGB 13.8 06/20/2020   HCT 50 06/20/2020   MCV 90 10/19/2019   PLT 156 06/20/2020   Lab Results  Component Value Date   ALT 29 08/29/2020   AST 21 08/29/2020   ALKPHOS 50 08/29/2020   BILITOT 0.4 08/29/2020     Review of Systems  Constitutional:  Negative for appetite change, chills, diaphoresis, fatigue and unexpected weight change.  HENT:  Negative for hearing loss, tinnitus, trouble swallowing and voice change.   Eyes:  Negative for visual disturbance.  Respiratory:  Negative for choking, shortness of breath and wheezing.   Cardiovascular:  Negative for chest pain, palpitations and leg swelling.  Gastrointestinal:  Negative for abdominal pain, blood in stool, constipation and diarrhea.  Genitourinary:  Negative for difficulty urinating, dysuria and frequency.  Musculoskeletal:  Negative for arthralgias, back pain and myalgias.  Skin:  Negative for color change and rash.  Neurological:  Negative for dizziness, syncope and headaches.  Hematological:  Negative for adenopathy.  Psychiatric/Behavioral:  Negative for dysphoric mood and sleep disturbance. The patient is not nervous/anxious.    Patient Active Problem List   Diagnosis Date Noted   Neck pain 02/29/2020   Coronary artery disease involving coronary bypass graft of native heart with angina pectoris (HCC) 10/13/2018   AAA (abdominal aortic aneurysm) (HCC)  02/12/2018   Spondylolisthesis of lumbar region 11/30/2017   Lumbar radiculopathy 11/11/2017   DDD (degenerative disc disease), lumbar 10/10/2017   Type II diabetes mellitus with complication (HCC) 10/10/2015   Low serum testosterone 10/10/2015   Hematuria, gross  10/15/2014   Allergic rhinitis 08/05/2014   CAD in native artery 08/05/2014   Hyperlipidemia associated with type 2 diabetes mellitus (HCC) 08/05/2014   Epididymal pain 08/05/2014   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   Essential (primary) hypertension 08/05/2014   Acid reflux 08/05/2014   History of colon polyps 08/05/2014   Obstructive apnea 08/05/2014   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    Allergies  Allergen Reactions   Cyclobenzaprine Other (See Comments)    Abdominal pain    Past Surgical History:  Procedure Laterality Date   COLONOSCOPY  08/16/2014   tubular adenoma   COLONOSCOPY  08/24/2017   one polyp - repeat 5 yrs   CORONARY ANGIOPLASTY N/A 07/2020   CORONARY ANGIOPLASTY WITH STENT PLACEMENT  2013   CORONARY ANGIOPLASTY WITH STENT PLACEMENT  01/2015   2 stents  - post circ and OM2   CORONARY ARTERY BYPASS GRAFT  2004    Social History   Tobacco Use   Smoking status: Former   Smokeless tobacco: Never  Vaping Use   Vaping Use: Never used  Substance Use Topics   Alcohol use: No    Alcohol/week: 0.0 standard drinks   Drug use: No     Medication list has been reviewed and updated.  Current Meds  Medication Sig   amLODipine (NORVASC) 5 MG tablet Take 1 tablet (5 mg total) by mouth daily.   amoxicillin-clavulanate (AUGMENTIN) 875-125 MG tablet Take 1 tablet by mouth 2 (two) times daily for 10 days.   Aspirin-Acetaminophen-Caffeine 500-325-65 MG PACK Take 1 packet by mouth 2 (two) times daily. Goodys   atorvastatin (LIPITOR) 40 MG tablet Take 1 tablet (40 mg total) by mouth daily.   Choline Fenofibrate (FENOFIBRIC ACID) 135 MG CPDR Take 1 tablet by mouth daily.   clopidogrel (PLAVIX) 75 MG tablet Take 75 mg by mouth daily.   empagliflozin (JARDIANCE) 25 MG TABS tablet Take 1 tablet (25 mg total) by mouth daily.   Evolocumab (REPATHA SURECLICK) 140 MG/ML SOAJ Inject into the skin.   gabapentin  (NEURONTIN) 300 MG capsule TAKE 1 CAPSULE BY MOUTH FOUR TIMES A DAY   lisinopril (ZESTRIL) 10 MG tablet Take 1 tablet (10 mg total) by mouth daily.   metoprolol succinate (TOPROL-XL) 100 MG 24 hr tablet Take 1 tablet (100 mg total) by mouth 2 (two) times daily. Take with or immediately following a meal.   neomycin-polymyxin-hydrocortisone (CORTISPORIN) OTIC solution Place 3 drops into the right ear 4 (four) times daily.   PRALUENT 75 MG/ML SOAJ Inject into the skin.   sildenafil (VIAGRA) 100 MG tablet Take 1 tablet (100 mg total) by mouth as needed for erectile dysfunction.   SitaGLIPtin-MetFORMIN HCl (JANUMET XR) [phone] MG TB24 Take 1 tablet by mouth daily.    PHQ 2/9 Scores 12/02/2020 11/19/2020 08/29/2020 02/29/2020  PHQ - 2 Score 0 2 0 0  PHQ- 9 Score 0 2 0 2    GAD 7 : Generalized Anxiety Score 12/02/2020 11/19/2020 08/29/2020 02/29/2020  Nervous, Anxious, on Edge 0 1 0 0  Control/stop worrying 0 1 0 0  Worry too much - different things 0 1 0 0  Trouble relaxing 0 1 0 0  Restless 0 1 0 0  Easily annoyed or irritable 0 1 0 0  Afraid - awful might happen 0 1 0 0  Total GAD 7 Score 0 7 0 0    BP Readings from Last 3 Encounters:  12/02/20 128/72  11/19/20 (!) 118/58  08/29/20 118/82    Physical Exam Vitals and nursing note reviewed.  Constitutional:      Appearance: Normal appearance. He is well-developed.  HENT:     Head: Normocephalic.     Right Ear: Tympanic membrane, ear canal and external ear normal.     Left Ear: Tympanic membrane, ear canal and external ear normal.     Nose: Nose normal.  Eyes:     Conjunctiva/sclera: Conjunctivae normal.     Pupils: Pupils are equal, round, and reactive to light.  Neck:     Thyroid: No thyromegaly.     Vascular: No carotid bruit.  Cardiovascular:     Rate and Rhythm: Normal rate and regular rhythm.     Heart sounds: Normal heart sounds.  Pulmonary:     Effort: Pulmonary effort is normal.     Breath sounds: Normal breath sounds.  No wheezing.  Chest:  Breasts:    Right: No mass.     Left: No mass.  Abdominal:     General: Bowel sounds are normal.     Palpations: Abdomen is soft.     Tenderness: There is no abdominal tenderness.  Musculoskeletal:        General: Normal range of motion.     Cervical back: Normal range of motion and neck supple.  Lymphadenopathy:     Cervical: No cervical adenopathy.  Skin:    General: Skin is warm and dry.  Neurological:     Mental Status: He is alert and oriented to person, place, and time.     Deep Tendon Reflexes: Reflexes are normal and symmetric.  Psychiatric:        Attention and Perception: Attention normal.        Mood and Affect: Mood normal.        Thought Content: Thought content normal.    Wt Readings from Last 3 Encounters:  12/02/20 259 lb (117.5 kg)  11/19/20 259 lb (117.5 kg)  08/29/20 258 lb (117 kg)    BP 128/72   Pulse 71   Temp 98.1 F (36.7 C) (Oral)   Ht 5\' 11"  (1.803 m)   Wt 259 lb (117.5 kg)   SpO2 96%   BMI 36.12 kg/m   Assessment and Plan: 1. Annual physical exam Exam is normal except for weight. Encourage regular exercise and appropriate dietary changes.  He is starting an exercise program.  He is interested in Ozempic to help with DM and weight. - Vitamin B12  2. Prostate cancer screening DRE deferred - PSA  3. Essential (primary) hypertension Clinically stable exam with well controlled BP. Tolerating medications without side effects at this time. Pt to continue current regimen and low sodium diet; benefits of regular exercise discussed. - Comprehensive metabolic panel - POCT urinalysis dipstick  4. Type II diabetes mellitus with complication (HCC) Clinically stable by exam and report without s/s of hypoglycemia. DM complicated by hypertension and dyslipidemia. Tolerating medications well without side effects or other concerns. Will add Ozempic and change Janumet to plain metformin at next visit - Hemoglobin A1c  5.  Hyperlipidemia associated with type 2 diabetes mellitus (HCC) On Atorvastatin and PSK9 inhibtor - plans to go back on Repatha for better control when current rx runs  out. - Lipid panel - POCT urinalysis dipstick  6. Need for immunization against influenza - Flu Vaccine QUAD High Dose(Fluad)   Partially dictated using Animal nutritionist. Any errors are unintentional.  Bari Edward, MD [facility]  12/02/2020

## 2020-12-03 LAB — LIPID PANEL
Chol/HDL Ratio: 4 ratio (ref 0.0–5.0)
Cholesterol, Total: 119 mg/dL (ref 100–199)
HDL: 30 mg/dL — ABNORMAL LOW (ref >39)
LDL Chol Calc (NIH): 55 mg/dL (ref 0–99)
Triglycerides: 209 mg/dL — ABNORMAL HIGH (ref 0–149)
VLDL Cholesterol Cal: 34 mg/dL (ref 5–40)

## 2020-12-03 LAB — COMPREHENSIVE METABOLIC PANEL
ALT: 30 IU/L (ref 0–44)
AST: 20 IU/L (ref 0–40)
Albumin/Globulin Ratio: 2 (ref 1.2–2.2)
Albumin: 4.5 g/dL (ref 3.8–4.8)
Alkaline Phosphatase: 50 IU/L (ref 44–121)
BUN/Creatinine Ratio: 21 (ref 10–24)
BUN: 21 mg/dL (ref 8–27)
Bilirubin Total: 0.3 mg/dL (ref 0.0–1.2)
CO2: 22 mmol/L (ref 20–29)
Calcium: 9.6 mg/dL (ref 8.6–10.2)
Chloride: 103 mmol/L (ref 96–106)
Creatinine, Ser: 1.01 mg/dL (ref 0.76–1.27)
Globulin, Total: 2.3 g/dL (ref 1.5–4.5)
Glucose: 129 mg/dL — ABNORMAL HIGH (ref 65–99)
Potassium: 5.1 mmol/L (ref 3.5–5.2)
Sodium: 139 mmol/L (ref 134–144)
Total Protein: 6.8 g/dL (ref 6.0–8.5)
eGFR: 83 mL/min/{1.73_m2} (ref >59)

## 2020-12-03 LAB — HEMOGLOBIN A1C
Est. average glucose Bld gHb Est-mCnc: 160 mg/dL
Hgb A1c MFr Bld: 7.2 % — ABNORMAL HIGH (ref 4.8–5.6)

## 2020-12-03 LAB — VITAMIN B12: Vitamin B-12: 953 pg/mL (ref 232–1245)

## 2020-12-03 LAB — PSA: Prostate Specific Ag, Serum: 1.9 ng/mL (ref 0.0–4.0)

## 2020-12-05 ENCOUNTER — Telehealth: Payer: Self-pay

## 2020-12-05 NOTE — Telephone Encounter (Signed)
Called pt left him know that his b12 was normal. Pt verbalized understanding.  KP

## 2020-12-05 NOTE — Telephone Encounter (Signed)
Copied from CRM [id number]. Topic: General - Other >> Dec 05, 2020 11:09 AM Gwenlyn Fudge wrote: Reason for CRM: Pt called and is requesting to speak with a nurse regarding his vitamin b12 results. Please advise.

## 2021-01-02 ENCOUNTER — Ambulatory Visit: Payer: 59 | Admitting: Internal Medicine

## 2021-01-02 ENCOUNTER — Encounter: Payer: Self-pay | Admitting: Internal Medicine

## 2021-01-02 ENCOUNTER — Other Ambulatory Visit: Payer: Self-pay

## 2021-01-02 VITALS — BP 116/76 | HR 65 | Ht 71.0 in | Wt 259.0 lb

## 2021-01-02 DIAGNOSIS — Z23 Encounter for immunization: Secondary | ICD-10-CM

## 2021-01-02 DIAGNOSIS — E118 Type 2 diabetes mellitus with unspecified complications: Secondary | ICD-10-CM

## 2021-01-02 DIAGNOSIS — Z6836 Body mass index (BMI) 36.0-36.9, adult: Secondary | ICD-10-CM | POA: Insufficient documentation

## 2021-01-02 NOTE — Progress Notes (Signed)
Date:  01/02/2021   Name:  Wyatt Mata   DOB:  Nov 16, 1955   MRN:  [id number]   Chief Complaint: Weight Check and Diabetes  Diabetes He presents for his follow-up diabetic visit. He has type 2 diabetes mellitus. Pertinent negatives for hypoglycemia include no headaches or tremors. Pertinent negatives for diabetes include no chest pain, no fatigue, no polydipsia and no polyuria. Current diabetic treatments: jardiance, metformin, januvia - ozempic samples last visit.  He has minimal side effects to ozempic.  He has noticed in the past few days that he gets full more quickly.  Lab Results  Component Value Date   CREATININE 1.01 12/02/2020   BUN 21 12/02/2020   NA 139 12/02/2020   K 5.1 12/02/2020   CL 103 12/02/2020   CO2 22 12/02/2020   Lab Results  Component Value Date   CHOL 119 12/02/2020   HDL 30 (L) 12/02/2020   LDLCALC 55 12/02/2020   TRIG 209 (H) 12/02/2020   CHOLHDL 4.0 12/02/2020   Lab Results  Component Value Date   TSH 1.150 10/13/2018   Lab Results  Component Value Date   HGBA1C 7.2 (H) 12/02/2020   Lab Results  Component Value Date   WBC 5.8 06/20/2020   HGB 13.8 06/20/2020   HCT 50 06/20/2020   MCV 90 10/19/2019   PLT 156 06/20/2020   Lab Results  Component Value Date   ALT 30 12/02/2020   AST 20 12/02/2020   ALKPHOS 50 12/02/2020   BILITOT 0.3 12/02/2020     Review of Systems  Constitutional:  Negative for appetite change, fatigue and unexpected weight change.  Eyes:  Negative for visual disturbance.  Respiratory:  Negative for cough, shortness of breath and wheezing.   Cardiovascular:  Negative for chest pain, palpitations and leg swelling.  Gastrointestinal:  Negative for abdominal pain and blood in stool.  Endocrine: Negative for polydipsia and polyuria.  Genitourinary:  Negative for dysuria and hematuria.  Skin:  Negative for color change and rash.  Neurological:  Negative for tremors, numbness and headaches.   Psychiatric/Behavioral:  Negative for dysphoric mood.    Patient Active Problem List   Diagnosis Date Noted   BMI 36.0-36.9,adult 01/02/2021   Neck pain 02/29/2020   Coronary artery disease involving coronary bypass graft of native heart with angina pectoris (HCC) 10/13/2018   AAA (abdominal aortic aneurysm) 02/12/2018   Spondylolisthesis of lumbar region 11/30/2017   Lumbar radiculopathy 11/11/2017   DDD (degenerative disc disease), lumbar 10/10/2017   Type II diabetes mellitus with complication (HCC) 10/10/2015   Low serum testosterone 10/10/2015   Hematuria, gross 10/15/2014   Allergic rhinitis 08/05/2014   CAD in native artery 08/05/2014   Hyperlipidemia associated with type 2 diabetes mellitus (HCC) 08/05/2014   Epididymal pain 08/05/2014   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   Essential (primary) hypertension 08/05/2014   Acid reflux 08/05/2014   History of colon polyps 08/05/2014   Obstructive apnea 08/05/2014   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    Allergies  Allergen Reactions   Cyclobenzaprine Other (See Comments)    Abdominal pain    Past Surgical History:  Procedure Laterality Date   COLONOSCOPY  08/16/2014   tubular adenoma   COLONOSCOPY  08/24/2017   one polyp - repeat 5 yrs   CORONARY ANGIOPLASTY N/A 07/2020   CORONARY ANGIOPLASTY WITH STENT PLACEMENT  2013   CORONARY ANGIOPLASTY WITH STENT PLACEMENT  01/2015   2 stents  -  post circ and OM2   CORONARY ARTERY BYPASS GRAFT  2004    Social History   Tobacco Use   Smoking status: Former   Smokeless tobacco: Never  Vaping Use   Vaping Use: Never used  Substance Use Topics   Alcohol use: No    Alcohol/week: 0.0 standard drinks   Drug use: No     Medication list has been reviewed and updated.  Current Meds  Medication Sig   amLODipine (NORVASC) 5 MG tablet Take 1 tablet (5 mg total) by mouth daily.   Aspirin-Acetaminophen-Caffeine  500-325-65 MG PACK Take 1 packet by mouth 2 (two) times daily. Goodys   atorvastatin (LIPITOR) 40 MG tablet Take 1 tablet (40 mg total) by mouth daily.   Choline Fenofibrate (FENOFIBRIC ACID) 135 MG CPDR Take 1 tablet by mouth daily.   clopidogrel (PLAVIX) 75 MG tablet Take 75 mg by mouth daily.   empagliflozin (JARDIANCE) 25 MG TABS tablet Take 1 tablet (25 mg total) by mouth daily.   Evolocumab (REPATHA SURECLICK) 140 MG/ML SOAJ Inject into the skin.   gabapentin (NEURONTIN) 300 MG capsule TAKE 1 CAPSULE BY MOUTH FOUR TIMES A DAY   lisinopril (ZESTRIL) 10 MG tablet Take 1 tablet (10 mg total) by mouth daily.   metoprolol succinate (TOPROL-XL) 100 MG 24 hr tablet Take 1 tablet (100 mg total) by mouth 2 (two) times daily. Take with or immediately following a meal.   neomycin-polymyxin-hydrocortisone (CORTISPORIN) OTIC solution Place 3 drops into the right ear 4 (four) times daily.   PRALUENT 75 MG/ML SOAJ Inject into the skin.   sildenafil (VIAGRA) 100 MG tablet Take 1 tablet (100 mg total) by mouth as needed for erectile dysfunction.   SitaGLIPtin-MetFORMIN HCl (JANUMET XR) [phone] MG TB24 Take 1 tablet by mouth daily.    PHQ 2/9 Scores 01/02/2021 12/02/2020 11/19/2020 08/29/2020  PHQ - 2 Score 0 0 2 0  PHQ- 9 Score 0 0 2 0    GAD 7 : Generalized Anxiety Score 01/02/2021 12/02/2020 11/19/2020 08/29/2020  Nervous, Anxious, on Edge 0 0 1 0  Control/stop worrying 0 0 1 0  Worry too much - different things 0 0 1 0  Trouble relaxing 0 0 1 0  Restless 0 0 1 0  Easily annoyed or irritable 0 0 1 0  Afraid - awful might happen 0 0 1 0  Total GAD 7 Score 0 0 7 0  Anxiety Difficulty Not difficult at all - - -    BP Readings from Last 3 Encounters:  01/02/21 116/76  12/02/20 128/72  11/19/20 (!) 118/58    Physical Exam Vitals and nursing note reviewed.  Constitutional:      General: He is not in acute distress.    Appearance: He is well-developed.  HENT:     Head: Normocephalic and  atraumatic.  Cardiovascular:     Rate and Rhythm: Normal rate and regular rhythm.     Pulses: Normal pulses.  Pulmonary:     Effort: Pulmonary effort is normal. No respiratory distress.     Breath sounds: No wheezing or rhonchi.  Musculoskeletal:     Cervical back: Normal range of motion.  Skin:    General: Skin is warm and dry.     Findings: No rash.  Neurological:     Mental Status: He is alert and oriented to person, place, and time.  Psychiatric:        Mood and Affect: Mood normal.  Behavior: Behavior normal.    Wt Readings from Last 3 Encounters:  01/02/21 259 lb (117.5 kg)  12/02/20 259 lb (117.5 kg)  11/19/20 259 lb (117.5 kg)    BP 116/76   Pulse 65   Ht 5\' 11"  (1.803 m)   Wt 259 lb (117.5 kg)   SpO2 97%   BMI 36.12 kg/m   Assessment and Plan: 1. Type II diabetes mellitus with complication (HCC) Continue current regimen. Additional sample of Ozempic given - in one month he should call for an Rx and to change Janumet to metformin alone  2. BMI 36.0-36.9,adult Continue Ozempic to help with weight control  3. Need for vaccination for pneumococcus - Pneumococcal conjugate vaccine 20-valent   Partially dictated using 03-09-1969. Any errors are unintentional.  Animal nutritionist, MD [facility]  01/02/2021

## 2021-01-15 DIAGNOSIS — H52223 Regular astigmatism, bilateral: Secondary | ICD-10-CM | POA: Diagnosis not present

## 2021-01-15 DIAGNOSIS — E1100 Type 2 diabetes mellitus with hyperosmolarity without nonketotic hyperglycemic-hyperosmolar coma (NKHHC): Secondary | ICD-10-CM | POA: Diagnosis not present

## 2021-01-15 LAB — HM DIABETES EYE EXAM

## 2021-02-06 DIAGNOSIS — I2510 Atherosclerotic heart disease of native coronary artery without angina pectoris: Secondary | ICD-10-CM | POA: Diagnosis not present

## 2021-02-18 ENCOUNTER — Telehealth: Payer: Self-pay | Admitting: Internal Medicine

## 2021-02-18 ENCOUNTER — Other Ambulatory Visit: Payer: Self-pay | Admitting: Internal Medicine

## 2021-02-18 DIAGNOSIS — E118 Type 2 diabetes mellitus with unspecified complications: Secondary | ICD-10-CM

## 2021-02-18 MED ORDER — SEMAGLUTIDE (1 MG/DOSE) 4 MG/3ML ~~LOC~~ SOPN: 1.0000 mg | PEN_INJECTOR | SUBCUTANEOUS | 5 refills | Status: DC

## 2021-02-18 MED ORDER — METFORMIN HCL ER 500 MG PO TB24: 1000.0000 mg | ORAL_TABLET | Freq: Every day | ORAL | 1 refills | Status: DC

## 2021-02-18 NOTE — Telephone Encounter (Signed)
Called and left detailed msg informing pt of this message.

## 2021-02-18 NOTE — Telephone Encounter (Addendum)
Pt would like  Dr Asencion Partridge to call in a prescriptio for Ozempic. Pt was given samples at 9/13 appt and he took his last does on Monday.  He said it was not even a half dose at that.  He says he is supposed to do same day and now he is 2 days behind. Would like another sample, or a new Rx sent in today. Pt states he is supposed to be increased to the next level of Ozempic.  [id number] [place], [place] - [address]

## 2021-02-18 NOTE — Telephone Encounter (Signed)
Pt also has additional questions. Did not understand the message completely.

## 2021-02-18 NOTE — Telephone Encounter (Signed)
Pt called and stated that Medication was sent to wrong pharmacy. [facility]

## 2021-02-19 ENCOUNTER — Other Ambulatory Visit: Payer: Self-pay

## 2021-02-19 DIAGNOSIS — E118 Type 2 diabetes mellitus with unspecified complications: Secondary | ICD-10-CM

## 2021-02-19 MED ORDER — METFORMIN HCL ER 500 MG PO TB24: 1000.0000 mg | ORAL_TABLET | Freq: Every day | ORAL | 1 refills | Status: DC

## 2021-02-19 MED ORDER — SEMAGLUTIDE (1 MG/DOSE) 4 MG/3ML ~~LOC~~ SOPN: 1.0000 mg | PEN_INJECTOR | SUBCUTANEOUS | 5 refills | Status: DC

## 2021-02-20 ENCOUNTER — Ambulatory Visit: Payer: 59 | Admitting: Internal Medicine

## 2021-02-23 ENCOUNTER — Ambulatory Visit
Admission: RE | Admit: 2021-02-23 | Discharge: 2021-02-23 | Disposition: A | Discharge status: Home / Self Care | Payer: 59 | Attending: Internal Medicine | Admitting: Internal Medicine

## 2021-02-23 ENCOUNTER — Encounter: Payer: Self-pay | Admitting: Internal Medicine

## 2021-02-23 ENCOUNTER — Ambulatory Visit
Admission: RE | Admit: 2021-02-23 | Discharge: 2021-02-23 | Disposition: A | Discharge status: Home / Self Care | Payer: 59 | Source: Ambulatory Visit | Attending: Internal Medicine | Admitting: Internal Medicine

## 2021-02-23 ENCOUNTER — Other Ambulatory Visit: Payer: Self-pay

## 2021-02-23 ENCOUNTER — Ambulatory Visit: Payer: 59 | Admitting: Internal Medicine

## 2021-02-23 VITALS — BP 139/75 | HR 84 | Temp 98.4°F | Ht 71.0 in | Wt 254.0 lb

## 2021-02-23 DIAGNOSIS — R059 Cough, unspecified: Secondary | ICD-10-CM | POA: Diagnosis not present

## 2021-02-23 DIAGNOSIS — J189 Pneumonia, unspecified organism: Secondary | ICD-10-CM | POA: Diagnosis not present

## 2021-02-23 DIAGNOSIS — I700 Atherosclerosis of aorta: Secondary | ICD-10-CM | POA: Diagnosis not present

## 2021-02-23 DIAGNOSIS — R0602 Shortness of breath: Secondary | ICD-10-CM | POA: Diagnosis not present

## 2021-02-23 DIAGNOSIS — Z951 Presence of aortocoronary bypass graft: Secondary | ICD-10-CM | POA: Diagnosis not present

## 2021-02-23 MED ORDER — ALBUTEROL SULFATE HFA 108 (90 BASE) MCG/ACT IN AERS: 2.0000 | INHALATION_SPRAY | Freq: Four times a day (QID) | RESPIRATORY_TRACT | 0 refills | Status: DC | PRN

## 2021-02-23 MED ORDER — CEFDINIR 300 MG PO CAPS: 300.0000 mg | ORAL_CAPSULE | Freq: Two times a day (BID) | ORAL | 0 refills | Status: AC

## 2021-02-23 NOTE — Patient Instructions (Signed)
Guaifenesin - make sure that you are getting this in your cough medication

## 2021-02-23 NOTE — Progress Notes (Signed)
Date:  02/23/2021   Name:  Wyatt Mata   DOB:  [birth year]   MRN:  [id number]   Chief Complaint: Nasal Congestion  URI  This is a new problem. The current episode started in the past 7 days. The problem has been gradually worsening. The fever has been present for Less than 1 day. Associated symptoms include congestion, coughing (Yellow production) and wheezing. Pertinent negatives include no ear pain, headaches, joint pain, nausea, sneezing, sore throat or vomiting.   Lab Results  Component Value Date   NA 139 12/02/2020   K 5.1 12/02/2020   CO2 22 12/02/2020   GLUCOSE 129 (H) 12/02/2020   BUN 21 12/02/2020   CREATININE 1.01 12/02/2020   CALCIUM 9.6 12/02/2020   EGFR 83 12/02/2020   GFRNONAA 75 06/20/2020   Lab Results  Component Value Date   CHOL 119 12/02/2020   HDL 30 (L) 12/02/2020   LDLCALC 55 12/02/2020   TRIG 209 (H) 12/02/2020   CHOLHDL 4.0 12/02/2020   Lab Results  Component Value Date   TSH 1.150 10/13/2018   Lab Results  Component Value Date   HGBA1C 7.2 (H) 12/02/2020   Lab Results  Component Value Date   WBC 5.8 06/20/2020   HGB 13.8 06/20/2020   HCT 50 06/20/2020   MCV 90 10/19/2019   PLT 156 06/20/2020   Lab Results  Component Value Date   ALT 30 12/02/2020   AST 20 12/02/2020   ALKPHOS 50 12/02/2020   BILITOT 0.3 12/02/2020   No results found for: 25OHVITD2, 25OHVITD3, VD25OH   Review of Systems  Constitutional:  Positive for diaphoresis, fatigue and fever (one day now resolved). Negative for chills.  HENT:  Positive for congestion and postnasal drip. Negative for ear pain, sneezing and sore throat.   Respiratory:  Positive for cough (Yellow production), chest tightness and wheezing.   Gastrointestinal:  Negative for nausea and vomiting.  Musculoskeletal:  Negative for joint pain.  Neurological:  Negative for headaches.   Patient Active Problem List   Diagnosis Date Noted   BMI 36.0-36.9,adult 01/02/2021   Neck pain 02/29/2020    Coronary artery disease involving coronary bypass graft of native heart with angina pectoris ([place]) 10/13/2018   AAA (abdominal aortic aneurysm) 02/12/2018   Spondylolisthesis of lumbar region 11/30/2017   Lumbar radiculopathy 11/11/2017   DDD (degenerative disc disease), lumbar 10/10/2017   Type II diabetes mellitus with complication ([place]) 18/56/3149   Low serum testosterone 10/10/2015   Hematuria, gross 10/15/2014   Allergic rhinitis 08/05/2014   CAD in native artery 08/05/2014   Hyperlipidemia associated with type 2 diabetes mellitus ([place]) 08/05/2014   Epididymal pain 08/05/2014   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   Essential (primary) hypertension 08/05/2014   Acid reflux 08/05/2014   History of colon polyps 08/05/2014   Obstructive apnea 08/05/2014   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    Allergies  Allergen Reactions   Cyclobenzaprine Other (See Comments)    Abdominal pain    Past Surgical History:  Procedure Laterality Date   COLONOSCOPY  08/16/2014   tubular adenoma   COLONOSCOPY  08/24/2017   one polyp - repeat 5 yrs   CORONARY ANGIOPLASTY N/A 07/2020   CORONARY ANGIOPLASTY WITH STENT PLACEMENT  2013   CORONARY ANGIOPLASTY WITH STENT PLACEMENT  01/2015   2 stents  - post circ and OM2   CORONARY ARTERY BYPASS GRAFT  2004    Social History  Tobacco Use   Smoking status: Former   Smokeless tobacco: Never  Vaping Use   Vaping Use: Never used  Substance Use Topics   Alcohol use: No    Alcohol/week: 0.0 standard drinks   Drug use: No     Medication list has been reviewed and updated.  Current Meds  Medication Sig   amLODipine (NORVASC) 5 MG tablet Take 1 tablet (5 mg total) by mouth daily.   Aspirin-Acetaminophen-Caffeine 500-325-65 MG PACK Take 1 packet by mouth 2 (two) times daily. Goodys   atorvastatin (LIPITOR) 40 MG tablet Take 1 tablet (40 mg total) by mouth daily.   Choline  Fenofibrate (FENOFIBRIC ACID) 135 MG CPDR Take 1 tablet by mouth daily.   clopidogrel (PLAVIX) 75 MG tablet Take 75 mg by mouth daily.   empagliflozin (JARDIANCE) 25 MG TABS tablet Take 1 tablet (25 mg total) by mouth daily.   gabapentin (NEURONTIN) 300 MG capsule TAKE 1 CAPSULE BY MOUTH FOUR TIMES A DAY   lisinopril (ZESTRIL) 10 MG tablet Take 1 tablet (10 mg total) by mouth daily.   metFORMIN (GLUCOPHAGE-XR) 500 MG 24 hr tablet Take 2 tablets (1,000 mg total) by mouth daily with breakfast.   metoprolol succinate (TOPROL-XL) 100 MG 24 hr tablet Take 1 tablet (100 mg total) by mouth 2 (two) times daily. Take with or immediately following a meal.   neomycin-polymyxin-hydrocortisone (CORTISPORIN) OTIC solution Place 3 drops into the right ear 4 (four) times daily.   PRALUENT 75 MG/ML SOAJ Inject into the skin.   Semaglutide, 1 MG/DOSE, 4 MG/3ML SOPN Inject 1 mg as directed once a week.   sildenafil (VIAGRA) 100 MG tablet Take 1 tablet (100 mg total) by mouth as needed for erectile dysfunction.    PHQ 2/9 Scores 02/23/2021 01/02/2021 12/02/2020 11/19/2020  PHQ - 2 Score 0 0 0 2  PHQ- 9 Score 0 0 0 2    GAD 7 : Generalized Anxiety Score 02/23/2021 01/02/2021 12/02/2020 11/19/2020  Nervous, Anxious, on Edge 0 0 0 1  Control/stop worrying 0 0 0 1  Worry too much - different things 0 0 0 1  Trouble relaxing 0 0 0 1  Restless 0 0 0 1  Easily annoyed or irritable 0 0 0 1  Afraid - awful might happen 0 0 0 1  Total GAD 7 Score 0 0 0 7  Anxiety Difficulty Not difficult at all Not difficult at all - -    BP Readings from Last 3 Encounters:  02/23/21 (!) 148/70  01/02/21 116/76  12/02/20 128/72    Physical Exam Constitutional:      Appearance: Normal appearance.  HENT:     Right Ear: Tympanic membrane normal.     Left Ear: Tympanic membrane normal.     Nose:     Right Sinus: No maxillary sinus tenderness or frontal sinus tenderness.     Left Sinus: No maxillary sinus tenderness or frontal  sinus tenderness.     Mouth/Throat:     Mouth: Mucous membranes are moist.     Pharynx: No oropharyngeal exudate or posterior oropharyngeal erythema.  Cardiovascular:     Rate and Rhythm: Normal rate and regular rhythm.     Pulses: Normal pulses.  Pulmonary:     Effort: Pulmonary effort is normal.     Breath sounds: Examination of the left-lower field reveals rhonchi. Rhonchi present. No wheezing.  Musculoskeletal:     Cervical back: Normal range of motion.  Lymphadenopathy:     Cervical: No  cervical adenopathy.  Neurological:     Mental Status: He is alert.  Psychiatric:        Attention and Perception: Attention normal.        Mood and Affect: Mood normal.    Wt Readings from Last 3 Encounters:  02/23/21 254 lb (115.2 kg)  01/02/21 259 lb (117.5 kg)  12/02/20 259 lb (117.5 kg)    BP (!) 148/70   Pulse 84   Temp 98.4 F (36.9 C) (Oral)   Ht '5\' 11"'  (1.803 m)   Wt 254 lb (115.2 kg)   SpO2 95%   BMI 35.43 kg/m   Assessment and Plan: 1. Community acquired pneumonia of left lower lobe of lung Continue cough syrup with guaifenesin; CXR Use albuterol as needed - DG Chest 2 View - albuterol (VENTOLIN HFA) 108 (90 Base) MCG/ACT inhaler; Inhale 2 puffs into the lungs every 6 (six) hours as needed for wheezing or shortness of breath.  Dispense: 1 each; Refill: 0 - cefdinir (OMNICEF) 300 MG capsule; Take 1 capsule (300 mg total) by mouth 2 (two) times daily for 10 days.  Dispense: 20 capsule; Refill: 0   Partially dictated using Editor, commissioning. Any errors are unintentional.  Halina Maidens, MD [facility]  02/23/2021

## 2021-02-24 ENCOUNTER — Telehealth: Payer: Self-pay

## 2021-02-24 NOTE — Telephone Encounter (Signed)
Copied from CRM [id number]. Topic: General - Inquiry >> Feb 24, 2021  3:31 PM Daphine Deutscher D wrote: Reason for CRM: Pt wants to know if the chest xrays have came back  [phone]

## 2021-03-20 ENCOUNTER — Ambulatory Visit: Payer: Self-pay

## 2021-03-20 NOTE — Telephone Encounter (Signed)
Sent patient mychart message

## 2021-03-20 NOTE — Telephone Encounter (Signed)
Pt calling in bc his friend tested positive for covid and he wanted to get medicine before he got sick over the weekend, he tested positive but he's been around his friend    Chief Complaint: COVID exposure Symptoms: None Frequency: n/a Pertinent Negatives: Patient denies symptoms Disposition: [] ED /[] Urgent Care (no appt availability in office) / [] Appointment(In office/virtual)/ []  [facility]/ [] Home Care/ [] Refused Recommended Disposition /[] [facility]/ []  Follow-up with PCP Additional Notes: Pt. Has afriend staying with him who has COVID. Pt. Does not have symptoms and tests negative. Asking for anti-viral incase he does get sick with COVID. Please advise pt.    Answer Assessment - Initial Assessment Questions 1. COVID-19 EXPOSURE: "Please describe how you were exposed to someone with a COVID-19 infection."     Yes 2. PLACE of CONTACT: "Where were you when you were exposed to COVID-19?" (e.g., home, school, medical waiting room; which city?)     Friend staying 3. TYPE of CONTACT: "How much contact was there?" (e.g., sitting next to, live in same house, work in same office, same building)     Same house 4. DURATION of CONTACT: "How long were you in contact with the COVID-19 patient?" (e.g., a few seconds, passed by person, a few minutes, 15 minutes or longer, live with the patient)     Hours 5. MASK: "Were you wearing a mask?" "Was the other person wearing a mask?" Note: wearing a mask reduces the risk of an otherwise close contact.     No 6. DATE of CONTACT: "When did you have contact with a COVID-19 patient?" (e.g., how many days ago)     This week 7. COMMUNITY SPREAD: "Are there lots of cases of COVID-19 (community spread) where you live?" (See public health department website, if unsure)       Yes 8. SYMPTOMS: "Do you have any symptoms?" (e.g., fever, cough, breathing difficulty, loss of taste or smell)     No 9. VACCINE: "Have you gotten the COVID-19  vaccine?" If Yes, ask: "Which one, how many shots, when did you get it?"     Yes 10. BOOSTER: "Have you received your COVID-19 booster?" If Yes, ask: "Which one and when did you get it?"       No 11. PREGNANCY OR POSTPARTUM: "Is there any chance you are pregnant?" "When was your last menstrual period?" "Did you deliver in the last 2 weeks?"       N/a 12. HIGH RISK: "Do you have any heart or lung problems?" (e.g., asthma , COPD, heart failure) "Do you have a weak immune system or other risk factors?" (e.g., HIV positive, chemotherapy, renal failure, diabetes mellitus, sickle cell anemia, obesity)       CAD, Diabetes 13. TRAVEL: "Have you traveled out of the country recently?" If Yes, ask: "When and where?"  Note: Travel becomes less relevant if there is widespread community transmission where the patient lives.       No  Protocols used: Coronavirus (COVID-19) Exposure-A-AH

## 2021-04-10 ENCOUNTER — Ambulatory Visit: Payer: 59 | Admitting: Internal Medicine

## 2021-04-10 ENCOUNTER — Encounter: Payer: Self-pay | Admitting: Internal Medicine

## 2021-04-10 ENCOUNTER — Other Ambulatory Visit: Payer: Self-pay

## 2021-04-10 VITALS — BP 110/60 | HR 67 | Ht 71.0 in | Wt 252.0 lb

## 2021-04-10 DIAGNOSIS — I10 Essential (primary) hypertension: Secondary | ICD-10-CM | POA: Diagnosis not present

## 2021-04-10 DIAGNOSIS — E118 Type 2 diabetes mellitus with unspecified complications: Secondary | ICD-10-CM | POA: Diagnosis not present

## 2021-04-10 LAB — POCT GLYCOSYLATED HEMOGLOBIN (HGB A1C): Hemoglobin A1C: 6.1 % — AB (ref 4.0–5.6)

## 2021-04-10 NOTE — Progress Notes (Signed)
Date:  04/10/2021   Name:  Wyatt Mata   DOB:  Apr 06, 1955   MRN:  [id number]   Chief Complaint: Diabetes (POC4, MICRO)  Diabetes He presents for his follow-up diabetic visit. He has type 2 diabetes mellitus. His disease course has been improving. Pertinent negatives for hypoglycemia include no headaches or tremors. Pertinent negatives for diabetes include no chest pain, no fatigue, no polydipsia and no polyuria. Current diabetic treatments: Jardiance, metformin and Ozempic. He is compliant with treatment all of the time. An ACE inhibitor/angiotensin II receptor blocker is being taken.  Hypertension This is a chronic problem. The problem is controlled. Associated symptoms include palpitations (had some when taking 2 atorvastatin per day accidentally). Pertinent negatives include no chest pain, headaches or shortness of breath. Past treatments include calcium channel blockers, ACE inhibitors and beta blockers.   Lab Results  Component Value Date   NA 139 12/02/2020   K 5.1 12/02/2020   CO2 22 12/02/2020   GLUCOSE 129 (H) 12/02/2020   BUN 21 12/02/2020   CREATININE 1.01 12/02/2020   CALCIUM 9.6 12/02/2020   EGFR 83 12/02/2020   GFRNONAA 75 06/20/2020   Lab Results  Component Value Date   CHOL 119 12/02/2020   HDL 30 (L) 12/02/2020   LDLCALC 55 12/02/2020   TRIG 209 (H) 12/02/2020   CHOLHDL 4.0 12/02/2020   Lab Results  Component Value Date   TSH 1.150 10/13/2018   Lab Results  Component Value Date   HGBA1C 6.1 (A) 04/10/2021   Lab Results  Component Value Date   WBC 5.8 06/20/2020   HGB 13.8 06/20/2020   HCT 50 06/20/2020   MCV 90 10/19/2019   PLT 156 06/20/2020   Lab Results  Component Value Date   ALT 30 12/02/2020   AST 20 12/02/2020   ALKPHOS 50 12/02/2020   BILITOT 0.3 12/02/2020   No results found for: 25OHVITD2, 25OHVITD3, VD25OH   Review of Systems  Constitutional:  Negative for appetite change, fatigue and unexpected weight change.  Eyes:   Negative for visual disturbance.  Respiratory:  Negative for cough, shortness of breath and wheezing.   Cardiovascular:  Positive for palpitations (had some when taking 2 atorvastatin per day accidentally). Negative for chest pain and leg swelling.  Gastrointestinal:  Negative for abdominal pain and blood in stool.  Endocrine: Negative for polydipsia and polyuria.  Genitourinary:  Negative for dysuria and hematuria.  Skin:  Negative for color change and rash.  Neurological:  Negative for tremors, numbness and headaches.  Psychiatric/Behavioral:  Negative for dysphoric mood.    Patient Active Problem List   Diagnosis Date Noted   BMI 36.0-36.9,adult 01/02/2021   Neck pain 02/29/2020   Coronary artery disease involving coronary bypass graft of native heart with angina pectoris ([place]) 10/13/2018   AAA (abdominal aortic aneurysm) 02/12/2018   Spondylolisthesis of lumbar region 11/30/2017   Lumbar radiculopathy 11/11/2017   DDD (degenerative disc disease), lumbar 10/10/2017   Type II diabetes mellitus with complication ([place]) 86/16/8372   Low serum testosterone 10/10/2015   Hematuria, gross 10/15/2014   Allergic rhinitis 08/05/2014   CAD in native artery 08/05/2014   Hyperlipidemia associated with type 2 diabetes mellitus ([place]) 08/05/2014   Epididymal pain 08/05/2014   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   Essential (primary) hypertension 08/05/2014   Acid reflux 08/05/2014   History of colon polyps 08/05/2014   Obstructive apnea 08/05/2014   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  Allergies  Allergen Reactions   Cyclobenzaprine Other (See Comments)    Abdominal pain    Past Surgical History:  Procedure Laterality Date   COLONOSCOPY  08/16/2014   tubular adenoma   COLONOSCOPY  08/24/2017   one polyp - repeat 5 yrs   CORONARY ANGIOPLASTY N/A 07/2020   CORONARY ANGIOPLASTY WITH STENT PLACEMENT  2013   CORONARY  ANGIOPLASTY WITH STENT PLACEMENT  01/2015   2 stents  - post circ and OM2   CORONARY ARTERY BYPASS GRAFT  2004    Social History   Tobacco Use   Smoking status: Former   Smokeless tobacco: Never  Vaping Use   Vaping Use: Never used  Substance Use Topics   Alcohol use: No    Alcohol/week: 0.0 standard drinks   Drug use: No     Medication list has been reviewed and updated.  Current Meds  Medication Sig   albuterol (VENTOLIN HFA) 108 (90 Base) MCG/ACT inhaler Inhale 2 puffs into the lungs every 6 (six) hours as needed for wheezing or shortness of breath.   amLODipine (NORVASC) 5 MG tablet Take 1 tablet (5 mg total) by mouth daily.   Aspirin-Acetaminophen-Caffeine 500-325-65 MG PACK Take 1 packet by mouth 2 (two) times daily. Goodys   atorvastatin (LIPITOR) 40 MG tablet Take 1 tablet (40 mg total) by mouth daily.   Choline Fenofibrate (FENOFIBRIC ACID) 135 MG CPDR Take 1 tablet by mouth daily.   clopidogrel (PLAVIX) 75 MG tablet Take 75 mg by mouth daily.   empagliflozin (JARDIANCE) 25 MG TABS tablet Take 1 tablet (25 mg total) by mouth daily.   gabapentin (NEURONTIN) 300 MG capsule TAKE 1 CAPSULE BY MOUTH FOUR TIMES A DAY   lisinopril (ZESTRIL) 10 MG tablet Take 1 tablet (10 mg total) by mouth daily.   metFORMIN (GLUCOPHAGE-XR) 500 MG 24 hr tablet Take 2 tablets (1,000 mg total) by mouth daily with breakfast.   metoprolol succinate (TOPROL-XL) 100 MG 24 hr tablet Take 1 tablet (100 mg total) by mouth 2 (two) times daily. Take with or immediately following a meal.   PRALUENT 75 MG/ML SOAJ Inject into the skin.   Semaglutide, 1 MG/DOSE, 4 MG/3ML SOPN Inject 1 mg as directed once a week.   sildenafil (VIAGRA) 100 MG tablet Take 1 tablet (100 mg total) by mouth as needed for erectile dysfunction.    PHQ 2/9 Scores 04/10/2021 02/23/2021 01/02/2021 12/02/2020  PHQ - 2 Score 0 0 0 0  PHQ- 9 Score 0 0 0 0    GAD 7 : Generalized Anxiety Score 04/10/2021 02/23/2021 01/02/2021 12/02/2020   Nervous, Anxious, on Edge 0 0 0 0  Control/stop worrying 0 0 0 0  Worry too much - different things 0 0 0 0  Trouble relaxing 0 0 0 0  Restless 0 0 0 0  Easily annoyed or irritable 0 0 0 0  Afraid - awful might happen 0 0 0 0  Total GAD 7 Score 0 0 0 0  Anxiety Difficulty Not difficult at all Not difficult at all Not difficult at all -    BP Readings from Last 3 Encounters:  04/10/21 110/60  02/23/21 139/75  01/02/21 116/76    Physical Exam Vitals and nursing note reviewed.  Constitutional:      General: He is not in acute distress.    Appearance: He is well-developed.  HENT:     Head: Normocephalic and atraumatic.  Cardiovascular:     Rate and Rhythm: Normal rate and  regular rhythm.     Pulses: Normal pulses.     Heart sounds: No murmur heard. Pulmonary:     Effort: Pulmonary effort is normal. No respiratory distress.     Breath sounds: No wheezing or rhonchi.  Musculoskeletal:     Cervical back: Normal range of motion.     Right lower leg: No edema.     Left lower leg: No edema.  Lymphadenopathy:     Cervical: No cervical adenopathy.  Skin:    General: Skin is warm and dry.     Capillary Refill: Capillary refill takes less than 2 seconds.     Findings: No rash.  Neurological:     General: No focal deficit present.     Mental Status: He is alert and oriented to person, place, and time.  Psychiatric:        Mood and Affect: Mood normal.        Behavior: Behavior normal.    Wt Readings from Last 3 Encounters:  04/10/21 252 lb (114.3 kg)  02/23/21 254 lb (115.2 kg)  01/02/21 259 lb (117.5 kg)    BP 110/60    Pulse 67    Ht _0  (1.803 m)    Wt 252 lb (114.3 kg)    SpO2 97%    BMI 35.15 kg/m   Assessment and Plan: 1. Type II diabetes mellitus with complication (HCC) Clinically stable by exam and report without s/s of hypoglycemia. DM complicated by hypertension and dyslipidemia. Tolerating medications well without side effects or other concerns. Doing  well with Ozempic and weight loss. Will decrease metformin to one per day. - POCT glycosylated hemoglobin (Hb A1C)= 6.1 down from 7.2 - Microalbumin / creatinine urine ratio  2. Essential (primary) hypertension Clinically stable exam with well controlled BP. Tolerating medications without side effects at this time. Pt to continue current regimen and low sodium diet; benefits of regular exercise as able discussed. If palpitation recur, he should see Cardiology.   Partially dictated using Editor, commissioning. Any errors are unintentional.  Halina Maidens, MD [facility]  04/10/2021

## 2021-04-10 NOTE — Patient Instructions (Addendum)
Cut the metformin 500 mg back to one a day

## 2021-04-12 LAB — MICROALBUMIN / CREATININE URINE RATIO
Creatinine, Urine: 137.2 mg/dL
Microalb/Creat Ratio: 9 mg/g creat (ref 0–29)
Microalbumin, Urine: 12.7 ug/mL

## 2021-05-04 ENCOUNTER — Encounter: Payer: Self-pay | Admitting: Internal Medicine

## 2021-06-28 ENCOUNTER — Other Ambulatory Visit: Payer: Self-pay | Admitting: Internal Medicine

## 2021-06-28 DIAGNOSIS — E1169 Type 2 diabetes mellitus with other specified complication: Secondary | ICD-10-CM

## 2021-06-28 DIAGNOSIS — J189 Pneumonia, unspecified organism: Secondary | ICD-10-CM

## 2021-06-28 DIAGNOSIS — I10 Essential (primary) hypertension: Secondary | ICD-10-CM

## 2021-06-28 DIAGNOSIS — E118 Type 2 diabetes mellitus with unspecified complications: Secondary | ICD-10-CM

## 2021-06-29 NOTE — Telephone Encounter (Signed)
All other Rx- too soon- filled 09/24/20 for 1 year, and Metformin- 02/19/21 -6 months ?Requested Prescriptions  ?Pending Prescriptions Disp Refills  ?? albuterol (VENTOLIN HFA) 108 (90 Base) MCG/ACT inhaler [Pharmacy Med Name: ALBUTEROL HFA (PROVENTIL) INH] 6.7 each   ?  Sig: TAKE 2 PUFFS BY MOUTH EVERY 6 HOURS AS NEEDED FOR WHEEZE OR SHORTNESS OF BREATH  ?  ? Pulmonology:  Beta Agonists 2 Passed - 06/28/2021  7:22 AM  ?  ?  Passed - Last BP in normal range  ?  BP Readings from Last 1 Encounters:  ?04/10/21 110/60  ?   ?  ?  Passed - Last Heart Rate in normal range  ?  Pulse Readings from Last 1 Encounters:  ?04/10/21 67  ?   ?  ?  Passed - Valid encounter within last 12 months  ?  Recent Outpatient Visits   ?      ? 2 months ago Type II diabetes mellitus with complication ([place])  ? [facility] Glean Hess, MD  ? 4 months ago Community acquired pneumonia of left lower lobe of lung  ? [facility] Glean Hess, MD  ? 5 months ago Type II diabetes mellitus with complication [facility])  ? [facility] Glean Hess, MD  ? 6 months ago Annual physical exam  ? [facility] Glean Hess, MD  ? 7 months ago Pharyngitis, unspecified etiology  ? [facility] Glean Hess, MD  ?  ?  ?Future Appointments   ?        ? In 3 weeks Army Melia Jesse Sans, MD [facility], [place]  ? In 5 months Army Melia Jesse Sans, MD [facility], [place]  ?  ? ?  ?  ?  ?? JARDIANCE 25 MG TABS tablet [Pharmacy Med Name: JARDIANCE 25 MG TABLET] 90 tablet 3  ?  Sig: TAKE 1 TABLET (25 MG TOTAL) BY MOUTH DAILY.  ?  ? Endocrinology:  Diabetes - SGLT2 Inhibitors Passed - 06/28/2021  7:22 AM  ?  ?  Passed - Cr in normal range and within 360 days  ?  Creatinine, Ser  ?Date Value Ref Range Status  ?12/02/2020 1.01 0.76 - 1.27 mg/dL Final  ?   ?  ?  Passed - HBA1C is between 0 and 7.9 and within 180 days  ?  Hemoglobin A1C  ?Date Value Ref Range Status  ?04/10/2021 6.1 (A) 4.0 - 5.6 % Final   ? ?Hgb A1c MFr Bld  ?Date Value Ref Range Status  ?12/02/2020 7.2 (H) 4.8 - 5.6 % Final  ?  Comment:  ?           Prediabetes: 5.7 - 6.4 ?         Diabetes: >6.4 ?         Glycemic control for adults with diabetes: <7.0 ?  ?   ?  ?  Passed - eGFR in normal range and within 360 days  ?  GFR calc Af Amer  ?Date Value Ref Range Status  ?02/29/2020 82 >59 mL/min/1.73 Final  ?  Comment:  ?  **In accordance with recommendations from the NKF-ASN Task force,** ?  [facility] is in the process of updating its eGFR calculation to the ?  2021 CKD-EPI creatinine equation that estimates kidney function ?  without a race variable. ?  ? ?GFR calc non Af Amer  ?Date Value Ref Range Status  ?06/20/2020 75  Final  ? ?  eGFR  ?Date Value Ref Range Status  ?12/02/2020 83 >59 mL/min/1.73 Final  ?   ?  ?  Passed - Valid encounter within last 6 months  ?  Recent Outpatient Visits   ?      ? 2 months ago Type II diabetes mellitus with complication ([place])  ? [facility] Glean Hess, MD  ? 4 months ago Community acquired pneumonia of left lower lobe of lung  ? [facility] Glean Hess, MD  ? 5 months ago Type II diabetes mellitus with complication [facility])  ? [facility] Glean Hess, MD  ? 6 months ago Annual physical exam  ? [facility] Glean Hess, MD  ? 7 months ago Pharyngitis, unspecified etiology  ? [facility] Glean Hess, MD  ?  ?  ?Future Appointments   ?        ? In 3 weeks Army Melia Jesse Sans, MD [facility], [place]  ? In 5 months Glean Hess, MD [facility]  ?  ? ?  ?  ?  ?? atorvastatin (LIPITOR) 40 MG tablet [Pharmacy Med Name: ATORVASTATIN 40 MG TABLET] 90 tablet 3  ?  Sig: TAKE 1 TABLET BY MOUTH EVERY DAY  ?  ? Cardiovascular:  Antilipid - Statins Failed - 06/28/2021  7:22 AM  ?  ?  Failed - Lipid Panel in normal range within the last 12 months  ?  Cholesterol, Total  ?Date Value Ref Range Status  ?12/02/2020 119 100 - 199 mg/dL  Final  ? ?LDL Chol Calc (NIH)  ?Date Value Ref Range Status  ?12/02/2020 55 0 - 99 mg/dL Final  ? ?HDL  ?Date Value Ref Range Status  ?12/02/2020 30 (L) >39 mg/dL Final  ? ?Triglycerides  ?Date Value Ref Range Status  ?12/02/2020 209 (H) 0 - 149 mg/dL Final  ? ?  ?  ?  Passed - Patient is not pregnant  ?  ?  Passed - Valid encounter within last 12 months  ?  Recent Outpatient Visits   ?      ? 2 months ago Type II diabetes mellitus with complication ([place])  ? [facility] Glean Hess, MD  ? 4 months ago Community acquired pneumonia of left lower lobe of lung  ? [facility] Glean Hess, MD  ? 5 months ago Type II diabetes mellitus with complication [facility])  ? [facility] Glean Hess, MD  ? 6 months ago Annual physical exam  ? [facility] Glean Hess, MD  ? 7 months ago Pharyngitis, unspecified etiology  ? [facility] Glean Hess, MD  ?  ?  ?Future Appointments   ?        ? In 3 weeks Army Melia Jesse Sans, MD [facility], [place]  ? In 5 months Glean Hess, MD [facility], [facility]  ?  ? ?  ?  ?  ?? lisinopril (ZESTRIL) 10 MG tablet [Pharmacy Med Name: LISINOPRIL 10 MG TABLET] 90 tablet 3  ?  Sig: TAKE 1 TABLET BY MOUTH EVERY DAY  ?  ? Cardiovascular:  ACE Inhibitors Failed - 06/28/2021  7:22 AM  ?  ?  Failed - Cr in normal range and within 180 days  ?  Creatinine, Ser  ?Date Value Ref Range Status  ?12/02/2020 1.01 0.76 - 1.27 mg/dL Final  ?   ?  ?  Failed - K in normal range and within 180 days  ?  Potassium  ?Date Value Ref Range Status  ?12/02/2020 5.1 3.5 - 5.2 mmol/L Final  ?   ?  ?  Passed - Patient is not pregnant  ?  ?  Passed - Last BP in normal range  ?  BP Readings from Last 1 Encounters:  ?04/10/21 110/60  ?   ?  ?  Passed - Valid encounter within last 6 months  ?  Recent Outpatient Visits   ?      ? 2 months ago Type II diabetes mellitus with complication ([place])  ? [facility] Glean Hess, MD  ? 4  months ago Community acquired pneumonia of left lower lobe of lung  ? [facility] Glean Hess, MD  ? 5 months ago Type II diabetes mellitus with complication [facility])  ? [facility] Glean Hess, MD  ? 6 months ago Annual physical exam  ? [facility] Glean Hess, MD  ? 7 months ago Pharyngitis, unspecified etiology  ? [facility] Glean Hess, MD  ?  ?  ?Future Appointments   ?        ? In 3 weeks Army Melia Jesse Sans, MD [facility], [place]  ? In 5 months Glean Hess, MD [facility], [facility]  ?  ? ?  ?  ?  ?? metFORMIN (GLUCOPHAGE-XR) 500 MG 24 hr tablet [Pharmacy Med Name: METFORMIN HCL ER 500 MG TABLET] 180 tablet 1  ?  Sig: TAKE 2 TABLETS BY MOUTH EVERY DAY WITH BREAKFAST  ?  ? Endocrinology:  Diabetes - Biguanides Failed - 06/28/2021  7:22 AM  ?  ?  Failed - CBC within normal limits and completed in the last 12 months  ?  WBC  ?Date Value Ref Range Status  ?06/20/2020 5.8  Final  ? ?RBC  ?Date Value Ref Range Status  ?10/19/2019 5.50 4.14 - 5.80 x10E6/uL Final  ?10/09/2014 100 (A) 4.69 - 6.13 M/uL Preliminary  ? ?Hemoglobin  ?Date Value Ref Range Status  ?06/20/2020 13.8 13.5 - 17.5 Final  ?10/19/2019 16.8 13.0 - 17.7 g/dL Final  ? ?HCT  ?Date Value Ref Range Status  ?06/20/2020 50 41 - 53 Final  ? ?Hematocrit  ?Date Value Ref Range Status  ?10/19/2019 49.2 37.5 - 51.0 % Final  ? ?MCHC  ?Date Value Ref Range Status  ?10/19/2019 34.1 31.5 - 35.7 g/dL Final  ? ?MCH  ?Date Value Ref Range Status  ?10/19/2019 30.5 26.6 - 33.0 pg Final  ? ?MCV  ?Date Value Ref Range Status  ?10/19/2019 90 79 - 97 fL Final  ? ?No results found for: PLTCOUNTKUC, LABPLAT, [place] ?RDW  ?Date Value Ref Range Status  ?10/19/2019 14.5 11.6 - 15.4 % Final  ? ?  ?  ?  Passed - Cr in normal range and within 360 days  ?  Creatinine, Ser  ?Date Value Ref Range Status  ?12/02/2020 1.01 0.76 - 1.27 mg/dL Final  ?   ?  ?  Passed - HBA1C is between 0 and 7.9 and within 180  days  ?  Hemoglobin A1C  ?Date Value Ref Range Status  ?04/10/2021 6.1 (A) 4.0 - 5.6 % Final  ? ?Hgb A1c MFr Bld  ?Date Value Ref Range Status  ?12/02/2020 7.2 (H) 4.8 - 5.6 % Final  ?  Comment:  ?  Prediabet

## 2021-07-24 ENCOUNTER — Other Ambulatory Visit: Payer: Self-pay | Admitting: Internal Medicine

## 2021-07-24 ENCOUNTER — Encounter: Payer: Self-pay | Admitting: Internal Medicine

## 2021-07-24 ENCOUNTER — Ambulatory Visit: Payer: 59 | Admitting: Internal Medicine

## 2021-07-24 VITALS — BP 132/82 | HR 77 | Ht 71.0 in | Wt 255.4 lb

## 2021-07-24 DIAGNOSIS — E118 Type 2 diabetes mellitus with unspecified complications: Secondary | ICD-10-CM

## 2021-07-24 DIAGNOSIS — I25709 Atherosclerosis of coronary artery bypass graft(s), unspecified, with unspecified angina pectoris: Secondary | ICD-10-CM | POA: Diagnosis not present

## 2021-07-24 DIAGNOSIS — I10 Essential (primary) hypertension: Secondary | ICD-10-CM

## 2021-07-24 DIAGNOSIS — E1169 Type 2 diabetes mellitus with other specified complication: Secondary | ICD-10-CM

## 2021-07-24 DIAGNOSIS — I714 Abdominal aortic aneurysm, without rupture, unspecified: Secondary | ICD-10-CM

## 2021-07-24 MED ORDER — SEMAGLUTIDE (2 MG/DOSE) 8 MG/3ML ~~LOC~~ SOPN: 2.0000 mg | PEN_INJECTOR | SUBCUTANEOUS | 1 refills | Status: DC

## 2021-07-24 NOTE — Telephone Encounter (Signed)
Requested Prescriptions  ?Pending Prescriptions Disp Refills  ?? atorvastatin (LIPITOR) 40 MG tablet [Pharmacy Med Name: ATORVASTATIN 40 MG TABLET] 90 tablet 3  ?  Sig: TAKE 1 TABLET BY MOUTH EVERY DAY  ?  ? Cardiovascular:  Antilipid - Statins Failed - 07/24/2021 12:17 PM  ?  ?  Failed - Lipid Panel in normal range within the last 12 months  ?  Cholesterol, Total  ?Date Value Ref Range Status  ?12/02/2020 119 100 - 199 mg/dL Final  ? ?LDL Chol Calc (NIH)  ?Date Value Ref Range Status  ?12/02/2020 55 0 - 99 mg/dL Final  ? ?HDL  ?Date Value Ref Range Status  ?12/02/2020 30 (L) >39 mg/dL Final  ? ?Triglycerides  ?Date Value Ref Range Status  ?12/02/2020 209 (H) 0 - 149 mg/dL Final  ? ?  ?  ?  Passed - Patient is not pregnant  ?  ?  Passed - Valid encounter within last 12 months  ?  Recent Outpatient Visits   ?      ? Today Type II diabetes mellitus with complication ([place])  ? [facility] Glean Hess, MD  ? 3 months ago Type II diabetes mellitus with complication [facility])  ? [facility] Glean Hess, MD  ? 5 months ago Community acquired pneumonia of left lower lobe of lung  ? [facility] Glean Hess, MD  ? 6 months ago Type II diabetes mellitus with complication [facility])  ? [facility] Glean Hess, MD  ? 7 months ago Annual physical exam  ? [facility] Glean Hess, MD  ?  ?  ?Future Appointments   ?        ? In 4 months Army Melia Jesse Sans, MD [facility], [place]  ?  ? ?  ?  ?  ?? [place] Fenofibrate (FENOFIBRIC ACID) 135 MG CPDR [Pharmacy Med Name: FENOFIBRIC ACID DR 135 MG CAP] 90 capsule 3  ?  Sig: TAKE 1 CAPSULE BY MOUTH EVERY DAY  ?  ? Cardiovascular:  Antilipid - Fibric Acid Derivatives Failed - 07/24/2021 12:17 PM  ?  ?  Failed - HGB in normal range and within 360 days  ?  Hemoglobin  ?Date Value Ref Range Status  ?06/20/2020 13.8 13.5 - 17.5 Final  ?10/19/2019 16.8 13.0 - 17.7 g/dL Final  ?   ?  ?  Failed - HCT in normal range and within  360 days  ?  HCT  ?Date Value Ref Range Status  ?06/20/2020 50 41 - 53 Final  ? ?Hematocrit  ?Date Value Ref Range Status  ?10/19/2019 49.2 37.5 - 51.0 % Final  ?   ?  ?  Failed - PLT in normal range and within 360 days  ?  Platelets  ?Date Value Ref Range Status  ?06/20/2020 156 150 - 399 Final  ?10/19/2019 142 (L) 150 - 450 x10E3/uL Final  ?   ?  ?  Failed - WBC in normal range and within 360 days  ?  WBC  ?Date Value Ref Range Status  ?06/20/2020 5.8  Final  ?   ?  ?  Failed - Lipid Panel in normal range within the last 12 months  ?  Cholesterol, Total  ?Date Value Ref Range Status  ?12/02/2020 119 100 - 199 mg/dL Final  ? ?LDL Chol Calc (NIH)  ?Date Value Ref Range Status  ?12/02/2020 55 0 - 99 mg/dL Final  ? ?HDL  ?Date  Value Ref Range Status  ?12/02/2020 30 (L) >39 mg/dL Final  ? ?Triglycerides  ?Date Value Ref Range Status  ?12/02/2020 209 (H) 0 - 149 mg/dL Final  ? ?  ?  ?  Passed - ALT in normal range and within 360 days  ?  ALT  ?Date Value Ref Range Status  ?12/02/2020 30 0 - 44 IU/L Final  ?   ?  ?  Passed - AST in normal range and within 360 days  ?  AST  ?Date Value Ref Range Status  ?12/02/2020 20 0 - 40 IU/L Final  ?   ?  ?  Passed - Cr in normal range and within 360 days  ?  Creatinine, Ser  ?Date Value Ref Range Status  ?12/02/2020 1.01 0.76 - 1.27 mg/dL Final  ?   ?  ?  Passed - eGFR is 30 or above and within 360 days  ?  GFR calc Af Amer  ?Date Value Ref Range Status  ?02/29/2020 82 >59 mL/min/1.73 Final  ?  Comment:  ?  **In accordance with recommendations from the NKF-ASN Task force,** ?  [facility] is in the process of updating its eGFR calculation to the ?  2021 CKD-EPI creatinine equation that estimates kidney function ?  without a race variable. ?  ? ?GFR calc non Af Amer  ?Date Value Ref Range Status  ?06/20/2020 75  Final  ? ?eGFR  ?Date Value Ref Range Status  ?12/02/2020 83 >59 mL/min/1.73 Final  ?   ?  ?  Passed - Valid encounter within last 12 months  ?  Recent Outpatient Visits   ?       ? Today Type II diabetes mellitus with complication ([place])  ? [facility] Glean Hess, MD  ? 3 months ago Type II diabetes mellitus with complication [facility])  ? [facility] Glean Hess, MD  ? 5 months ago Community acquired pneumonia of left lower lobe of lung  ? [facility] Glean Hess, MD  ? 6 months ago Type II diabetes mellitus with complication [facility])  ? [facility] Glean Hess, MD  ? 7 months ago Annual physical exam  ? [facility] Glean Hess, MD  ?  ?  ?Future Appointments   ?        ? In 4 months Glean Hess, MD [facility]  ?  ? ?  ?  ?  ?? amLODipine (NORVASC) 5 MG tablet [Pharmacy Med Name: AMLODIPINE BESYLATE 5 MG TAB] 90 tablet 1  ?  Sig: TAKE 1 TABLET (5 MG TOTAL) BY MOUTH DAILY.  ?  ? Cardiovascular: Calcium Channel Blockers 2 Passed - 07/24/2021 12:17 PM  ?  ?  Passed - Last BP in normal range  ?  BP Readings from Last 1 Encounters:  ?07/24/21 132/82  ?   ?  ?  Passed - Last Heart Rate in normal range  ?  Pulse Readings from Last 1 Encounters:  ?07/24/21 77  ?   ?  ?  Passed - Valid encounter within last 6 months  ?  Recent Outpatient Visits   ?      ? Today Type II diabetes mellitus with complication ([place])  ? [facility] Glean Hess, MD  ? 3 months ago Type II diabetes mellitus with complication [facility])  ? [facility] Glean Hess, MD  ? 5 months ago Community acquired pneumonia of  left lower lobe of lung  ? [facility] Glean Hess, MD  ? 6 months ago Type II diabetes mellitus with complication [facility])  ? [facility] Glean Hess, MD  ? 7 months ago Annual physical exam  ? [facility] Glean Hess, MD  ?  ?  ?Future Appointments   ?        ? In 4 months Glean Hess, MD [facility]  ?  ? ?  ?  ?  ?? metFORMIN (GLUCOPHAGE-XR) 500 MG 24 hr tablet [Pharmacy Med Name: METFORMIN HCL ER 500 MG TABLET] 180 tablet 1  ?   Sig: TAKE 2 TABLETS BY MOUTH EVERY DAY WITH BREAKFAST  ?  ? Endocrinology:  Diabetes - Biguanides Failed - 07/24/2021 12:17 PM  ?  ?  Failed - CBC within normal limits and completed in the last 12 months  ?  WBC  ?Date Value Ref Range Status  ?06/20/2020 5.8  Final  ? ?RBC  ?Date Value Ref Range Status  ?10/19/2019 5.50 4.14 - 5.80 x10E6/uL Final  ?10/09/2014 100 (A) 4.69 - 6.13 M/uL Preliminary  ? ?Hemoglobin  ?Date Value Ref Range Status  ?06/20/2020 13.8 13.5 - 17.5 Final  ?10/19/2019 16.8 13.0 - 17.7 g/dL Final  ? ?HCT  ?Date Value Ref Range Status  ?06/20/2020 50 41 - 53 Final  ? ?Hematocrit  ?Date Value Ref Range Status  ?10/19/2019 49.2 37.5 - 51.0 % Final  ? ?MCHC  ?Date Value Ref Range Status  ?10/19/2019 34.1 31.5 - 35.7 g/dL Final  ? ?MCH  ?Date Value Ref Range Status  ?10/19/2019 30.5 26.6 - 33.0 pg Final  ? ?MCV  ?Date Value Ref Range Status  ?10/19/2019 90 79 - 97 fL Final  ? ?No results found for: PLTCOUNTKUC, LABPLAT, [place] ?RDW  ?Date Value Ref Range Status  ?10/19/2019 14.5 11.6 - 15.4 % Final  ? ?  ?  ?  Passed - Cr in normal range and within 360 days  ?  Creatinine, Ser  ?Date Value Ref Range Status  ?12/02/2020 1.01 0.76 - 1.27 mg/dL Final  ?   ?  ?  Passed - HBA1C is between 0 and 7.9 and within 180 days  ?  Hemoglobin A1C  ?Date Value Ref Range Status  ?04/10/2021 6.1 (A) 4.0 - 5.6 % Final  ? ?Hgb A1c MFr Bld  ?Date Value Ref Range Status  ?12/02/2020 7.2 (H) 4.8 - 5.6 % Final  ?  Comment:  ?           Prediabetes: 5.7 - 6.4 ?         Diabetes: >6.4 ?         Glycemic control for adults with diabetes: <7.0 ?  ?   ?  ?  Passed - eGFR in normal range and within 360 days  ?  GFR calc Af Amer  ?Date Value Ref Range Status  ?02/29/2020 82 >59 mL/min/1.73 Final  ?  Comment:  ?  **In accordance with recommendations from the NKF-ASN Task force,** ?  [facility] is in the process of updating its eGFR calculation to the ?  2021 CKD-EPI creatinine equation that estimates kidney function ?  without a race  variable. ?  ? ?GFR calc non Af Amer  ?Date Value Ref Range Status  ?06/20/2020 75  Final  ? ?eGFR  ?Date Value Ref Range Status  ?12/02/2020 83 >59 mL/min/1.73 Final  ?   ?  ?  Passed - B12 Level in norm

## 2021-07-24 NOTE — Progress Notes (Signed)
? ? ?Date:  07/24/2021  ? ?Name:  Wyatt Mata   DOB:  [birth year]   MRN:  [id number] ? ? ?Chief Complaint: Diabetes and Hypertension ? ?Hypertension ?This is a chronic problem. The problem is controlled. Pertinent negatives include no chest pain, headaches, palpitations or shortness of breath. Past treatments include calcium channel blockers, ACE inhibitors and beta blockers. Hypertensive end-organ damage includes CAD/MI. There is no history of kidney disease or CVA.  ?Diabetes ?He presents for his follow-up diabetic visit. He has type 2 diabetes mellitus. His disease course has been stable. Pertinent negatives for hypoglycemia include no headaches or tremors. Pertinent negatives for diabetes include no chest pain, no fatigue, no polydipsia and no polyuria. Symptoms are stable. Pertinent negatives for diabetic complications include no CVA. Current diabetic treatments: ozempic, metformin, jardiance. An ACE inhibitor/angiotensin II receptor blocker is being taken.  ? ?Lab Results  ?Component Value Date  ? NA 139 12/02/2020  ? K 5.1 12/02/2020  ? CO2 22 12/02/2020  ? GLUCOSE 129 (H) 12/02/2020  ? BUN 21 12/02/2020  ? CREATININE 1.01 12/02/2020  ? CALCIUM 9.6 12/02/2020  ? EGFR 83 12/02/2020  ? GFRNONAA 75 06/20/2020  ? ?Lab Results  ?Component Value Date  ? CHOL 119 12/02/2020  ? HDL 30 (L) 12/02/2020  ? [place] 55 12/02/2020  ? TRIG 209 (H) 12/02/2020  ? CHOLHDL 4.0 12/02/2020  ? ?Lab Results  ?Component Value Date  ? TSH 1.150 10/13/2018  ? ?Lab Results  ?Component Value Date  ? HGBA1C 6.1 (A) 04/10/2021  ? ?Lab Results  ?Component Value Date  ? WBC 5.8 06/20/2020  ? HGB 13.8 06/20/2020  ? HCT 50 06/20/2020  ? MCV 90 10/19/2019  ? PLT 156 06/20/2020  ? ?Lab Results  ?Component Value Date  ? ALT 30 12/02/2020  ? AST 20 12/02/2020  ? ALKPHOS 50 12/02/2020  ? BILITOT 0.3 12/02/2020  ? ?No results found for: 25OHVITD2, [place], VD25OH  ? ?Review of Systems  ?Constitutional:  Negative for appetite change, fatigue and  unexpected weight change.  ?Eyes:  Negative for visual disturbance.  ?Respiratory:  Negative for cough, shortness of breath and wheezing.   ?Cardiovascular:  Negative for chest pain, palpitations and leg swelling.  ?Gastrointestinal:  Negative for abdominal pain and blood in stool.  ?Endocrine: Negative for polydipsia and polyuria.  ?Genitourinary:  Negative for dysuria and hematuria.  ?Skin:  Negative for color change and rash.  ?Neurological:  Negative for tremors, numbness and headaches.  ?Psychiatric/Behavioral:  Negative for dysphoric mood.   ? ?Patient Active Problem List  ? Diagnosis Date Noted  ? BMI 36.0-36.9,adult 01/02/2021  ? Neck pain 02/29/2020  ? Coronary artery disease involving coronary bypass graft of native heart with angina pectoris ([place]) 10/13/2018  ? AAA (abdominal aortic aneurysm) ([place]) 02/12/2018  ? Spondylolisthesis of lumbar region 11/30/2017  ? Lumbar radiculopathy 11/11/2017  ? DDD (degenerative disc disease), lumbar 10/10/2017  ? Type II diabetes mellitus with complication ([place]) 38/88/2800  ? Low serum testosterone 10/10/2015  ? Hematuria, gross 10/15/2014  ? Allergic rhinitis 08/05/2014  ? CAD in native artery 08/05/2014  ? Hyperlipidemia associated with type 2 diabetes mellitus ([place]) 08/05/2014  ? Epididymal pain 08/05/2014  ? 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 Essential (primary) hypertension 08/05/2014  ? Acid reflux 08/05/2014  ? History of colon polyps 08/05/2014  ? Obstructive apnea 08/05/2014  ? 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 ? ?Allergies  ?Allergen Reactions  ?  Cyclobenzaprine Other (See Comments)  ?  Abdominal pain  ? ? ?Past Surgical History:  ?Procedure Laterality Date  ? COLONOSCOPY  08/16/2014  ? tubular adenoma  ? COLONOSCOPY  08/24/2017  ? one polyp - repeat 5 yrs  ? CORONARY ANGIOPLASTY N/A 07/2020  ? CORONARY ANGIOPLASTY WITH STENT PLACEMENT  2013  ? CORONARY ANGIOPLASTY WITH STENT PLACEMENT  01/2015   ? 2 stents  - post circ and OM2  ? CORONARY ARTERY BYPASS GRAFT  2004  ? ? ?Social History  ? ?Tobacco Use  ? Smoking status: Former  ? Smokeless tobacco: Never  ?Vaping Use  ? Vaping Use: Never used  ?Substance Use Topics  ? Alcohol use: No  ?  Alcohol/week: 0.0 standard drinks  ? Drug use: No  ? ? ? ?Medication list has been reviewed and updated. ? ?Current Meds  ?Medication Sig  ? albuterol (VENTOLIN HFA) 108 (90 Base) MCG/ACT inhaler TAKE 2 PUFFS BY MOUTH EVERY 6 HOURS AS NEEDED FOR WHEEZE OR SHORTNESS OF BREATH  ? amLODipine (NORVASC) 5 MG tablet Take 1 tablet (5 mg total) by mouth daily.  ? Aspirin-Acetaminophen-Caffeine 500-325-65 MG PACK Take 1 packet by mouth 2 (two) times daily. [place]  ? atorvastatin (LIPITOR) 40 MG tablet Take 1 tablet (40 mg total) by mouth daily.  ? Choline Fenofibrate (FENOFIBRIC ACID) 135 MG CPDR Take 1 tablet by mouth daily.  ? clopidogrel (PLAVIX) 75 MG tablet Take 75 mg by mouth daily.  ? empagliflozin (JARDIANCE) 25 MG TABS tablet Take 1 tablet (25 mg total) by mouth daily.  ? gabapentin (NEURONTIN) 300 MG capsule TAKE 1 CAPSULE BY MOUTH FOUR TIMES A DAY  ? lisinopril (ZESTRIL) 10 MG tablet Take 1 tablet (10 mg total) by mouth daily.  ? metFORMIN (GLUCOPHAGE-XR) 500 MG 24 hr tablet Take 2 tablets (1,000 mg total) by mouth daily with breakfast. (Patient taking differently: Take 500 mg by mouth daily with breakfast.)  ? metoprolol succinate (TOPROL-XL) 100 MG 24 hr tablet Take 1 tablet (100 mg total) by mouth 2 (two) times daily. Take with or immediately following a meal. (Patient taking differently: Take 100 mg by mouth daily. Take with or immediately following a meal.)  ? REPATHA SURECLICK 536 MG/ML SOAJ Inject into the skin every 14 (fourteen) days.  ? Semaglutide, 1 MG/DOSE, 4 MG/3ML SOPN Inject 1 mg as directed once a week.  ? sildenafil (VIAGRA) 100 MG tablet Take 1 tablet (100 mg total) by mouth as needed for erectile dysfunction.  ? ? ? ?  07/24/2021  ?  8:43 AM 04/10/2021  ?   8:25 AM 02/23/2021  ?  2:57 PM 01/02/2021  ?  8:01 AM  ?GAD 7 : Generalized Anxiety Score  ?Nervous, Anxious, on Edge 0 0 0 0  ?Control/stop worrying 0 0 0 0  ?Worry too much - different things 0 0 0 0  ?Trouble relaxing 0 0 0 0  ?Restless 0 0 0 0  ?Easily annoyed or irritable 0 0 0 0  ?Afraid - awful might happen 0 0 0 0  ?Total GAD 7 Score 0 0 0 0  ?Anxiety Difficulty  Not difficult at all Not difficult at all Not difficult at all  ? ? ? ?  07/24/2021  ?  8:43 AM  ?Depression screen PHQ 2/9  ?Decreased Interest 0  ?Down, Depressed, Hopeless 0  ?PHQ - 2 Score 0  ?Altered sleeping 0  ?Tired, decreased energy 0  ?Change in appetite 0  ?Feeling bad or  failure about yourself  0  ?Trouble concentrating 0  ?Moving slowly or fidgety/restless 0  ?Suicidal thoughts 0  ?PHQ-9 Score 0  ?Difficult doing work/chores Not difficult at all  ? ? ?BP Readings from Last 3 Encounters:  ?07/24/21 132/82  ?04/10/21 110/60  ?02/23/21 139/75  ? ? ?Physical Exam ?Vitals and nursing note reviewed.  ?Constitutional:   ?   General: He is not in acute distress. ?   Appearance: He is well-developed.  ?HENT:  ?   Head: Normocephalic and atraumatic.  ?Cardiovascular:  ?   Rate and Rhythm: Normal rate and regular rhythm.  ?   Pulses: Normal pulses.  ?   Heart sounds: No murmur heard. ?Pulmonary:  ?   Effort: Pulmonary effort is normal. No respiratory distress.  ?   Breath sounds: No wheezing or rhonchi.  ?Musculoskeletal:  ?   Cervical back: Normal range of motion.  ?   Right lower leg: No edema.  ?   Left lower leg: No edema.  ?Lymphadenopathy:  ?   Cervical: No cervical adenopathy.  ?Skin: ?   General: Skin is warm and dry.  ?   Findings: No rash.  ?Neurological:  ?   Mental Status: He is alert and oriented to person, place, and time.  ?Psychiatric:     ?   Mood and Affect: Mood normal.     ?   Behavior: Behavior normal.  ? ? ?Wt Readings from Last 3 Encounters:  ?07/24/21 255 lb 6.4 oz (115.8 kg)  ?04/10/21 252 lb (114.3 kg)  ?02/23/21 254 lb  (115.2 kg)  ? ? ?BP 132/82   Pulse 77   Ht _0  (1.803 m)   Wt 255 lb 6.4 oz (115.8 kg)   SpO2 97%   BMI 35.62 kg/m?  ? ?Assessment and Plan: ?1. Type II diabetes mellitus with complication ([place]) ?Cli

## 2021-07-25 LAB — BASIC METABOLIC PANEL
BUN/Creatinine Ratio: 17 (ref 10–24)
BUN: 18 mg/dL (ref 8–27)
CO2: 23 mmol/L (ref 20–29)
Calcium: 10.1 mg/dL (ref 8.6–10.2)
Chloride: 103 mmol/L (ref 96–106)
Creatinine, Ser: 1.04 mg/dL (ref 0.76–1.27)
Glucose: 110 mg/dL — ABNORMAL HIGH (ref 70–99)
Potassium: 4.9 mmol/L (ref 3.5–5.2)
Sodium: 145 mmol/L — ABNORMAL HIGH (ref 134–144)
eGFR: 79 mL/min/{1.73_m2} (ref >59)

## 2021-07-25 LAB — HEMOGLOBIN A1C
Est. average glucose Bld gHb Est-mCnc: 160 mg/dL
Hgb A1c MFr Bld: 7.2 % — ABNORMAL HIGH (ref 4.8–5.6)

## 2021-08-14 ENCOUNTER — Encounter: Payer: 59 | Admitting: Family Medicine

## 2021-08-14 ENCOUNTER — Other Ambulatory Visit: Payer: Self-pay | Admitting: Internal Medicine

## 2021-08-14 DIAGNOSIS — M25552 Pain in left hip: Secondary | ICD-10-CM

## 2021-08-21 DIAGNOSIS — I25700 Atherosclerosis of coronary artery bypass graft(s), unspecified, with unstable angina pectoris: Secondary | ICD-10-CM | POA: Diagnosis not present

## 2021-09-10 DIAGNOSIS — M7072 Other bursitis of hip, left hip: Secondary | ICD-10-CM | POA: Diagnosis not present

## 2021-09-10 DIAGNOSIS — M1712 Unilateral primary osteoarthritis, left knee: Secondary | ICD-10-CM | POA: Diagnosis not present

## 2021-09-10 DIAGNOSIS — M7071 Other bursitis of hip, right hip: Secondary | ICD-10-CM | POA: Diagnosis not present

## 2021-09-11 DIAGNOSIS — M25551 Pain in right hip: Secondary | ICD-10-CM | POA: Diagnosis not present

## 2021-09-11 DIAGNOSIS — M6281 Muscle weakness (generalized): Secondary | ICD-10-CM | POA: Diagnosis not present

## 2021-09-11 DIAGNOSIS — M25552 Pain in left hip: Secondary | ICD-10-CM | POA: Diagnosis not present

## 2021-10-21 ENCOUNTER — Other Ambulatory Visit: Payer: Self-pay | Admitting: Internal Medicine

## 2021-10-21 DIAGNOSIS — I10 Essential (primary) hypertension: Secondary | ICD-10-CM

## 2021-10-22 NOTE — Telephone Encounter (Signed)
Requested Prescriptions  Pending Prescriptions Disp Refills  . metoprolol succinate (TOPROL-XL) 100 MG 24 hr tablet [Pharmacy Med Name: METOPROLOL SUCC ER 100 MG TAB] 180 tablet 0    Sig: Take 1 tablet (100 mg total) by mouth daily. Take with or immediately following a meal.     Cardiovascular:  Beta Blockers Passed - 10/21/2021  1:50 AM      Passed - Last BP in normal range    BP Readings from Last 1 Encounters:  07/24/21 132/82         Passed - Last Heart Rate in normal range    Pulse Readings from Last 1 Encounters:  07/24/21 77         Passed - Valid encounter within last 6 months    Recent Outpatient Visits          3 months ago Type II diabetes mellitus with complication [facility])   [facility] Reubin Milan, MD   6 months ago Type II diabetes mellitus with complication [facility])   [facility] Reubin Milan, MD   8 months ago Community acquired pneumonia of left lower lobe of lung   [facility] Reubin Milan, MD   9 months ago Type II diabetes mellitus with complication [facility])   [facility] Reubin Milan, MD   10 months ago Annual physical exam   [facility] Reubin Milan, MD      Future Appointments            In 1 month Judithann Graves, Nyoka Cowden, MD [facility], [facility]

## 2021-11-18 ENCOUNTER — Telehealth: Payer: Self-pay | Admitting: Internal Medicine

## 2021-11-18 DIAGNOSIS — E118 Type 2 diabetes mellitus with unspecified complications: Secondary | ICD-10-CM

## 2021-11-18 NOTE — Telephone Encounter (Signed)
Requested medication (s) are due for refill today: no  Requested medication (s) are on the active medication list: yes  Last refill:  07/24/21, 90 and 1 RF  Future visit scheduled: yes  Notes to clinic:  Unable to refill per protocol, Rx request is too soon. Last refill 07/24/21 for 90 and 1 rf     Requested Prescriptions  Pending Prescriptions Disp Refills   metFORMIN (GLUCOPHAGE-XR) 500 MG 24 hr tablet [Pharmacy Med Name: METFORMIN HCL ER 500 MG TABLET] 180 tablet 1    Sig: TAKE 2 TABLETS BY MOUTH EVERY DAY WITH BREAKFAST     Endocrinology:  Diabetes - Biguanides Failed - 11/18/2021  3:09 AM      Failed - CBC within normal limits and completed in the last 12 months    WBC  Date Value Ref Range Status  06/20/2020 5.8  Final   RBC  Date Value Ref Range Status  10/19/2019 5.50 4.14 - 5.80 x10E6/uL Final  10/09/2014 100 (A) 4.69 - 6.13 M/uL Preliminary   Hemoglobin  Date Value Ref Range Status  06/20/2020 13.8 13.5 - 17.5 Final  10/19/2019 16.8 13.0 - 17.7 g/dL Final   HCT  Date Value Ref Range Status  06/20/2020 50 41 - 53 Final   Hematocrit  Date Value Ref Range Status  10/19/2019 49.2 37.5 - 51.0 % Final   MCHC  Date Value Ref Range Status  10/19/2019 34.1 31.5 - 35.7 g/dL Final   [facility]  Date Value Ref Range Status  10/19/2019 30.5 26.6 - 33.0 pg Final   MCV  Date Value Ref Range Status  10/19/2019 90 79 - 97 fL Final   No results found for: "PLTCOUNTKUC", "LABPLAT", "POCPLA" RDW  Date Value Ref Range Status  10/19/2019 14.5 11.6 - 15.4 % Final         Passed - Cr in normal range and within 360 days    Creatinine, Ser  Date Value Ref Range Status  07/24/2021 1.04 0.76 - 1.27 mg/dL Final         Passed - HBA1C is between 0 and 7.9 and within 180 days    Hgb A1c MFr Bld  Date Value Ref Range Status  07/24/2021 7.2 (H) 4.8 - 5.6 % Final    Comment:             Prediabetes: 5.7 - 6.4          Diabetes: >6.4          Glycemic control for adults with  diabetes: <7.0          Passed - eGFR in normal range and within 360 days    GFR calc Af Amer  Date Value Ref Range Status  02/29/2020 82 >59 mL/min/1.73 Final    Comment:    **In accordance with recommendations from the NKF-ASN Task force,**   [facility] is in the process of updating its eGFR calculation to the   2021 CKD-EPI creatinine equation that estimates kidney function   without a race variable.    GFR calc non Af Amer  Date Value Ref Range Status  06/20/2020 75  Final   eGFR  Date Value Ref Range Status  07/24/2021 79 >59 mL/min/1.73 Final         Passed - B12 Level in normal range and within 720 days    Vitamin B-12  Date Value Ref Range Status  12/02/2020 953 232 - 1,245 pg/mL Final         Passed -  Valid encounter within last 6 months    Recent Outpatient Visits           3 months ago Type II diabetes mellitus with complication (HCC)   [facility] Berglund, Laura H, MD   7 months ago Type II diabetes mellitus with complication (HCC)   [facility] Berglund, Laura H, MD   8 months ago Community acquired pneumonia of left lower lobe of lung   [facility] Berglund, Laura H, MD   10 months ago Type II diabetes mellitus with complication (HCC)   [facility] Berglund, Laura H, MD   11 months ago Annual physical exam   [facility] Berglund, Laura H, MD       Future Appointments             In 2 weeks Berglund, Laura H, MD [facility]              

## 2021-11-18 NOTE — Telephone Encounter (Signed)
Pt called and stated that he would like a call back from the nurse. He does not know if he should be taking one or two pills a day. Please advise

## 2021-11-18 NOTE — Telephone Encounter (Signed)
Called spoke with pt, he states that he was instructed  by Dr. Judithann Graves at his OV in May to take 1 tab daily for metformin. The current sig states for patient to take medication 2 times a day. He would like another prescription stating new sig of 1 tab a day.  Spend to [facility] on [address]. Please advise pt.

## 2021-11-19 ENCOUNTER — Other Ambulatory Visit: Payer: Self-pay | Admitting: Internal Medicine

## 2021-11-19 DIAGNOSIS — E118 Type 2 diabetes mellitus with unspecified complications: Secondary | ICD-10-CM

## 2021-11-19 MED ORDER — METFORMIN HCL ER 500 MG PO TB24: 500.0000 mg | ORAL_TABLET | Freq: Every day | ORAL | 1 refills | Status: DC

## 2021-11-19 NOTE — Telephone Encounter (Signed)
Please review.  KP

## 2021-12-07 ENCOUNTER — Encounter: Payer: Self-pay | Admitting: Internal Medicine

## 2021-12-07 ENCOUNTER — Ambulatory Visit (INDEPENDENT_AMBULATORY_CARE_PROVIDER_SITE_OTHER): Payer: 59 | Admitting: Internal Medicine

## 2021-12-07 VITALS — BP 124/60 | HR 66 | Ht 71.0 in | Wt 256.4 lb

## 2021-12-07 DIAGNOSIS — Z125 Encounter for screening for malignant neoplasm of prostate: Secondary | ICD-10-CM

## 2021-12-07 DIAGNOSIS — I25709 Atherosclerosis of coronary artery bypass graft(s), unspecified, with unspecified angina pectoris: Secondary | ICD-10-CM

## 2021-12-07 DIAGNOSIS — E1169 Type 2 diabetes mellitus with other specified complication: Secondary | ICD-10-CM

## 2021-12-07 DIAGNOSIS — Z0000 Encounter for general adult medical examination without abnormal findings: Secondary | ICD-10-CM | POA: Diagnosis not present

## 2021-12-07 DIAGNOSIS — I10 Essential (primary) hypertension: Secondary | ICD-10-CM | POA: Diagnosis not present

## 2021-12-07 DIAGNOSIS — E118 Type 2 diabetes mellitus with unspecified complications: Secondary | ICD-10-CM

## 2021-12-07 DIAGNOSIS — E785 Hyperlipidemia, unspecified: Secondary | ICD-10-CM | POA: Diagnosis not present

## 2021-12-07 DIAGNOSIS — Z23 Encounter for immunization: Secondary | ICD-10-CM | POA: Diagnosis not present

## 2021-12-07 NOTE — Progress Notes (Signed)
Date:  12/07/2021   Name:  Wyatt Mata   DOB:  09-Feb-1956   MRN:  [id number]   Chief Complaint: Annual Exam Wyatt Mata is a 66 y.o. male who presents today for his Complete Annual Exam. He feels well. He reports exercising - none. He reports he is sleeping well.   Colonoscopy: 08/2017  Immunization History  Administered Date(s) Administered   Fluad Quad(high Dose 65+) 12/02/2020   Influenza,inj,Quad PF,6+ Mos 01/10/2015, 01/13/2018, 12/08/2018, 01/04/2020   Influenza-Unspecified 01/03/2017   Moderna Sars-Covid-2 Vaccination 05/21/2019, 06/21/2019   PNEUMOCOCCAL CONJUGATE-20 01/02/2021   Pneumococcal Polysaccharide-23 07/30/2011   Td 09/19/2008, 06/19/2018   Zoster, Live 02/02/2013   Health Maintenance Due  Topic Date Due   Zoster Vaccines- Shingrix (1 of 2) Never done    Lab Results  Component Value Date   PSA1 1.9 12/02/2020   PSA1 1.9 10/19/2019   PSA1 2.0 10/13/2018   PSA 1.6 01/07/2014     Diabetes He presents for his follow-up diabetic visit. He has type 2 diabetes mellitus. His disease course has been stable. Pertinent negatives for hypoglycemia include no dizziness, headaches or nervousness/anxiousness. Pertinent negatives for diabetes include no chest pain and no fatigue. Pertinent negatives for diabetic complications include no CVA. Current diabetic treatment includes oral agent (dual therapy) (janumet and ozempic). He is compliant with treatment all of the time. An ACE inhibitor/angiotensin II receptor blocker is being taken. Eye exam is current.  Hyperlipidemia This is a chronic problem. The problem is controlled. Associated symptoms include shortness of breath. Pertinent negatives include no chest pain or myalgias. Current antihyperlipidemic treatment includes statins and fibric acid derivatives (and Repatha). The current treatment provides significant improvement of lipids.  Hypertension This is a chronic problem. The problem is controlled. Associated  symptoms include shortness of breath. Pertinent negatives include no chest pain, headaches or palpitations. Past treatments include calcium channel blockers, beta blockers, ACE inhibitors and direct vasodilators. Hypertensive end-organ damage includes CAD/MI. There is no history of kidney disease or CVA.  CAD - was seen in June by Cardiology and reported some shortness of breath with exertion.  ISDN was prescribed but he did not take it due to timing with Viagra dose.  He notes that Ortho did some knee/hip xrays and saw vascular calcification in his femoral region.  He admits that he sometimes has to stop walking due to hip pain but this could be claudication.  He denies rest pain.  Lab Results  Component Value Date   NA 145 (H) 07/24/2021   K 4.9 07/24/2021   CO2 23 07/24/2021   GLUCOSE 110 (H) 07/24/2021   BUN 18 07/24/2021   CREATININE 1.04 07/24/2021   CALCIUM 10.1 07/24/2021   EGFR 79 07/24/2021   GFRNONAA 75 06/20/2020   Lab Results  Component Value Date   CHOL 119 12/02/2020   HDL 30 (L) 12/02/2020   LDLCALC 55 12/02/2020   TRIG 209 (H) 12/02/2020   CHOLHDL 4.0 12/02/2020   Lab Results  Component Value Date   TSH 1.150 10/13/2018   Lab Results  Component Value Date   HGBA1C 7.2 (H) 07/24/2021   Lab Results  Component Value Date   WBC 5.8 06/20/2020   HGB 13.8 06/20/2020   HCT 50 06/20/2020   MCV 90 10/19/2019   PLT 156 06/20/2020   Lab Results  Component Value Date   ALT 30 12/02/2020   AST 20 12/02/2020   ALKPHOS 50 12/02/2020   BILITOT 0.3 12/02/2020  No results found for: "25OHVITD2", "25OHVITD3", "VD25OH"   Review of Systems  Constitutional:  Negative for appetite change, chills, diaphoresis, fatigue and unexpected weight change.  HENT:  Negative for hearing loss, tinnitus, trouble swallowing and voice change.   Eyes:  Negative for visual disturbance.  Respiratory:  Positive for shortness of breath. Negative for choking and wheezing.        Scant pale  yellow phlegm production in the AM - no other symptoms   Cardiovascular:  Negative for chest pain, palpitations and leg swelling.  Gastrointestinal:  Negative for abdominal pain, blood in stool, constipation and diarrhea.  Genitourinary:  Negative for difficulty urinating, dysuria and frequency.  Musculoskeletal:  Positive for arthralgias (both hips). Negative for back pain and myalgias.  Skin:  Negative for color change and rash.  Neurological:  Negative for dizziness, syncope and headaches.  Hematological:  Negative for adenopathy.  Psychiatric/Behavioral:  Negative for dysphoric mood and sleep disturbance. The patient is not nervous/anxious.     Patient Active Problem List   Diagnosis Date Noted   Osteoarthritis of left knee 09/10/2021   BMI 36.0-36.9,adult 01/02/2021   Neck pain 02/29/2020   Coronary artery disease involving coronary bypass graft of native heart with angina pectoris ([place]) 10/13/2018   AAA (abdominal aortic aneurysm) ([place]) 02/12/2018   Spondylolisthesis of lumbar region 11/30/2017   Lumbar radiculopathy 11/11/2017   DDD (degenerative disc disease), lumbar 10/10/2017   Type II diabetes mellitus with complication ([place]) 19/14/7829   Low serum testosterone 10/10/2015   Hematuria, gross 10/15/2014   Allergic rhinitis 08/05/2014   Hyperlipidemia associated with type 2 diabetes mellitus ([place]) 08/05/2014   Epididymal pain 08/05/2014   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   Essential (primary) hypertension 08/05/2014   Acid reflux 08/05/2014   History of colon polyps 08/05/2014   Obstructive apnea 08/05/2014   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    Allergies  Allergen Reactions   Cyclobenzaprine Other (See Comments)    Abdominal pain    Past Surgical History:  Procedure Laterality Date   COLONOSCOPY  08/16/2014   tubular adenoma   COLONOSCOPY  08/24/2017   one polyp - repeat 5 yrs   CORONARY  ANGIOPLASTY N/A 07/2020   CORONARY ANGIOPLASTY WITH STENT PLACEMENT  2013   CORONARY ANGIOPLASTY WITH STENT PLACEMENT  01/2015   2 stents  - post circ and OM2   CORONARY ARTERY BYPASS GRAFT  2004    Social History   Tobacco Use   Smoking status: Former   Smokeless tobacco: Never  Vaping Use   Vaping Use: Never used  Substance Use Topics   Alcohol use: No    Alcohol/week: 0.0 standard drinks of alcohol   Drug use: No     Medication list has been reviewed and updated.  Current Meds  Medication Sig   albuterol (VENTOLIN HFA) 108 (90 Base) MCG/ACT inhaler TAKE 2 PUFFS BY MOUTH EVERY 6 HOURS AS NEEDED FOR WHEEZE OR SHORTNESS OF BREATH   amLODipine (NORVASC) 5 MG tablet TAKE 1 TABLET (5 MG TOTAL) BY MOUTH DAILY.   Aspirin-Acetaminophen-Caffeine 500-325-65 MG PACK Take 1 packet by mouth 2 (two) times daily. [place]   atorvastatin (LIPITOR) 40 MG tablet TAKE 1 TABLET BY MOUTH EVERY DAY   Choline Fenofibrate (FENOFIBRIC ACID) 135 MG CPDR TAKE 1 CAPSULE BY MOUTH EVERY DAY   clopidogrel (PLAVIX) 75 MG tablet Take 75 mg by mouth daily.   gabapentin (NEURONTIN) 300 MG capsule TAKE 1 CAPSULE  BY MOUTH FOUR TIMES A DAY   isosorbide mononitrate (IMDUR) 30 MG 24 hr tablet Take 30 mg by mouth daily.   JARDIANCE 25 MG TABS tablet TAKE 1 TABLET (25 MG TOTAL) BY MOUTH DAILY.   lisinopril (ZESTRIL) 10 MG tablet TAKE 1 TABLET BY MOUTH EVERY DAY   metFORMIN (GLUCOPHAGE-XR) 500 MG 24 hr tablet Take 1 tablet (500 mg total) by mouth daily with breakfast.   metoprolol succinate (TOPROL-XL) 100 MG 24 hr tablet Take 1 tablet (100 mg total) by mouth daily. Take with or immediately following a meal.   REPATHA SURECLICK 062 MG/ML SOAJ Inject into the skin every 14 (fourteen) days.   Semaglutide, 2 MG/DOSE, 8 MG/3ML SOPN Inject 2 mg as directed once a week.   sildenafil (VIAGRA) 100 MG tablet Take 1 tablet (100 mg total) by mouth as needed for erectile dysfunction.       12/07/2021    8:05 AM 07/24/2021     8:43 AM 04/10/2021    8:25 AM 02/23/2021    2:57 PM  GAD 7 : Generalized Anxiety Score  Nervous, Anxious, on Edge 0 0 0 0  Control/stop worrying 0 0 0 0  Worry too much - different things 0 0 0 0  Trouble relaxing 0 0 0 0  Restless 0 0 0 0  Easily annoyed or irritable 0 0 0 0  Afraid - awful might happen 0 0 0 0  Total GAD 7 Score 0 0 0 0  Anxiety Difficulty Not difficult at all  Not difficult at all Not difficult at all       12/07/2021    8:05 AM 07/24/2021    8:43 AM 04/10/2021    8:25 AM  Depression screen PHQ 2/9  Decreased Interest 0 0 0  Down, Depressed, Hopeless 0 0 0  PHQ - 2 Score 0 0 0  Altered sleeping 0 0 0  Tired, decreased energy 0 0 0  Change in appetite 0 0 0  Feeling bad or failure about yourself  0 0 0  Trouble concentrating 0 0 0  Moving slowly or fidgety/restless 0 0 0  Suicidal thoughts 0 0 0  PHQ-9 Score 0 0 0  Difficult doing work/chores Not difficult at all Not difficult at all Not difficult at all    BP Readings from Last 3 Encounters:  12/07/21 124/60  07/24/21 132/82  04/10/21 110/60    Physical Exam Vitals and nursing note reviewed.  Constitutional:      Appearance: Normal appearance. He is well-developed.  HENT:     Head: Normocephalic.     Right Ear: External ear normal. There is impacted cerumen.     Left Ear: Tympanic membrane, ear canal and external ear normal.     Nose: Nose normal.     Right Sinus: No maxillary sinus tenderness or frontal sinus tenderness.     Left Sinus: No frontal sinus tenderness.     Mouth/Throat:     Pharynx: No oropharyngeal exudate or posterior oropharyngeal erythema.  Eyes:     Conjunctiva/sclera: Conjunctivae normal.     Pupils: Pupils are equal, round, and reactive to light.  Neck:     Thyroid: No thyromegaly.     Vascular: No carotid bruit.  Cardiovascular:     Rate and Rhythm: Normal rate and regular rhythm.     Pulses:          Dorsalis pedis pulses are 1+ on the right side and 1+ on the left  side.     Heart sounds: Normal heart sounds.  Pulmonary:     Effort: Pulmonary effort is normal.     Breath sounds: Normal breath sounds. No wheezing.  Chest:  Breasts:    Right: No mass.     Left: No mass.  Abdominal:     General: Bowel sounds are normal.     Palpations: Abdomen is soft.     Tenderness: There is no abdominal tenderness.  Musculoskeletal:        General: Normal range of motion.     Cervical back: Normal range of motion and neck supple.     Right lower leg: No edema.     Left lower leg: No edema.  Lymphadenopathy:     Cervical: No cervical adenopathy.  Skin:    General: Skin is warm and dry.  Neurological:     Mental Status: He is alert and oriented to person, place, and time.     Deep Tendon Reflexes: Reflexes are normal and symmetric.  Psychiatric:        Attention and Perception: Attention normal.        Mood and Affect: Mood normal.        Thought Content: Thought content normal.     Wt Readings from Last 3 Encounters:  12/07/21 256 lb 6.4 oz (116.3 kg)  07/24/21 255 lb 6.4 oz (115.8 kg)  04/10/21 252 lb (114.3 kg)    BP 124/60   Pulse 66   Ht _0  (1.803 m)   Wt 256 lb 6.4 oz (116.3 kg)   SpO2 96%   BMI 35.76 kg/m   Assessment and Plan: 1. Annual physical exam Exam is normal except for weight. Encourage regular exercise and return to previous dietary changes at allowed weight loss. Mild phlegm production likely allergic - can try Claritin if worsening - CBC with Differential/Platelet - Comprehensive metabolic panel - Hemoglobin A1c - Lipid panel - PSA  2. Prostate cancer screening DRE deferred - PSA  3. Type II diabetes mellitus with complication (HCC) Clinically stable by exam and report without s/s of hypoglycemia. DM complicated by hypertension and dyslipidemia. Tolerating medications well without side effects or other concerns. - Comprehensive metabolic panel - Hemoglobin A1c - Microalbumin / creatinine urine ratio  4.  Hyperlipidemia associated with type 2 diabetes mellitus ([place]) Tolerating statin medication without side effects at this time.  Also on fibric acid and Repatha. LDL is at goal of < 70 on current dose Continue same therapy without change at this time. - Lipid panel  5. Coronary artery disease involving coronary bypass graft of native heart with angina pectoris [facility]) Encourage his to discuss femoral findings and possible claudication with his cardiologist at the next visit.  He should return sooner if symptoms worsen.  He should also consider taking the ISDN.  6. Essential (primary) hypertension Clinically stable exam with well controlled BP. Tolerating medications without side effects at this time. Pt to continue current regimen and low sodium diet; benefits of regular exercise as able discussed. - CBC with Differential/Platelet   Partially dictated using Editor, commissioning. Any errors are unintentional.  Halina Maidens, MD [facility]  12/07/2021

## 2021-12-08 LAB — COMPREHENSIVE METABOLIC PANEL
ALT: 28 IU/L (ref 0–44)
AST: 19 IU/L (ref 0–40)
Albumin/Globulin Ratio: 2.2 (ref 1.2–2.2)
Albumin: 4.3 g/dL (ref 3.9–4.9)
Alkaline Phosphatase: 50 IU/L (ref 44–121)
BUN/Creatinine Ratio: 17 (ref 10–24)
BUN: 16 mg/dL (ref 8–27)
Bilirubin Total: 0.4 mg/dL (ref 0.0–1.2)
CO2: 23 mmol/L (ref 20–29)
Calcium: 9.6 mg/dL (ref 8.6–10.2)
Chloride: 103 mmol/L (ref 96–106)
Creatinine, Ser: 0.96 mg/dL (ref 0.76–1.27)
Globulin, Total: 2 g/dL (ref 1.5–4.5)
Glucose: 132 mg/dL — ABNORMAL HIGH (ref 70–99)
Potassium: 5 mmol/L (ref 3.5–5.2)
Sodium: 140 mmol/L (ref 134–144)
Total Protein: 6.3 g/dL (ref 6.0–8.5)
eGFR: 87 mL/min/{1.73_m2} (ref >59)

## 2021-12-08 LAB — CBC WITH DIFFERENTIAL/PLATELET
Basophils Absolute: 0 10*3/uL (ref 0.0–0.2)
Basos: 1 %
EOS (ABSOLUTE): 0.1 10*3/uL (ref 0.0–0.4)
Eos: 3 %
Hematocrit: 48.1 % (ref 37.5–51.0)
Hemoglobin: 17.9 g/dL — ABNORMAL HIGH (ref 13.0–17.7)
Immature Grans (Abs): 0 10*3/uL (ref 0.0–0.1)
Immature Granulocytes: 1 %
Lymphocytes Absolute: 1.2 10*3/uL (ref 0.7–3.1)
Lymphs: 22 %
MCH: 32.6 pg (ref 26.6–33.0)
MCHC: 37.2 g/dL — ABNORMAL HIGH (ref 31.5–35.7)
MCV: 88 fL (ref 79–97)
Monocytes Absolute: 0.6 10*3/uL (ref 0.1–0.9)
Monocytes: 11 %
Neutrophils Absolute: 3.6 10*3/uL (ref 1.4–7.0)
Neutrophils: 62 %
Platelets: 173 10*3/uL (ref 150–450)
RBC: 5.49 x10E6/uL (ref 4.14–5.80)
RDW: 13.8 % (ref 11.6–15.4)
WBC: 5.6 10*3/uL (ref 3.4–10.8)

## 2021-12-08 LAB — MICROALBUMIN / CREATININE URINE RATIO
Creatinine, Urine: 133.9 mg/dL
Microalb/Creat Ratio: 16 mg/g creat (ref 0–29)
Microalbumin, Urine: 21.4 ug/mL

## 2021-12-08 LAB — LIPID PANEL
Chol/HDL Ratio: 1.7 ratio (ref 0.0–5.0)
Cholesterol, Total: 57 mg/dL — ABNORMAL LOW (ref 100–199)
HDL: 33 mg/dL — ABNORMAL LOW (ref >39)
LDL Chol Calc (NIH): 6 mg/dL (ref 0–99)
Triglycerides: 91 mg/dL (ref 0–149)
VLDL Cholesterol Cal: 18 mg/dL (ref 5–40)

## 2021-12-08 LAB — PSA: Prostate Specific Ag, Serum: 4.2 ng/mL — ABNORMAL HIGH (ref 0.0–4.0)

## 2021-12-08 LAB — HEMOGLOBIN A1C
Est. average glucose Bld gHb Est-mCnc: 160 mg/dL
Hgb A1c MFr Bld: 7.2 % — ABNORMAL HIGH (ref 4.8–5.6)

## 2022-01-22 ENCOUNTER — Other Ambulatory Visit: Payer: Self-pay | Admitting: Internal Medicine

## 2022-01-22 DIAGNOSIS — H2513 Age-related nuclear cataract, bilateral: Secondary | ICD-10-CM | POA: Diagnosis not present

## 2022-01-22 DIAGNOSIS — E119 Type 2 diabetes mellitus without complications: Secondary | ICD-10-CM | POA: Diagnosis not present

## 2022-01-22 DIAGNOSIS — I2510 Atherosclerotic heart disease of native coronary artery without angina pectoris: Secondary | ICD-10-CM

## 2022-01-22 DIAGNOSIS — I25710 Atherosclerosis of autologous vein coronary artery bypass graft(s) with unstable angina pectoris: Secondary | ICD-10-CM

## 2022-01-22 LAB — HM DIABETES EYE EXAM

## 2022-01-22 NOTE — Telephone Encounter (Signed)
Requested medication (s) are due for refill today: -  Requested medication (s) are on the active medication list: historical med  Last refill:  11/19/20  Future visit scheduled: historical med  Notes to clinic:  historical provider   Requested Prescriptions  Pending Prescriptions Disp Refills   clopidogrel (PLAVIX) 75 MG tablet [Pharmacy Med Name: CLOPIDOGREL 75 MG TABLET] 90 tablet 3    Sig: TAKE 1 TABLET BY [place] DAY     Hematology: Antiplatelets - clopidogrel Failed - 01/22/2022  1:38 AM      Failed - HGB in normal range and within 180 days    Hemoglobin  Date Value Ref Range Status  12/07/2021 17.9 (H) 13.0 - 17.7 g/dL Final         Passed - HCT in normal range and within 180 days    Hematocrit  Date Value Ref Range Status  12/07/2021 48.1 37.5 - 51.0 % Final         Passed - PLT in normal range and within 180 days    Platelets  Date Value Ref Range Status  12/07/2021 173 150 - 450 x10E3/uL Final         Passed - Cr in normal range and within 360 days    Creatinine, Ser  Date Value Ref Range Status  12/07/2021 0.96 0.76 - 1.27 mg/dL Final         Passed - Valid encounter within last 6 months    Recent Outpatient Visits           1 month ago Annual physical exam   [place] [facility] at [facility], Jesse Sans, MD   6 months ago Type II diabetes mellitus with complication [facility])   [place] [facility] at [facility], Jesse Sans, MD   9 months ago Type II diabetes mellitus with complication [facility])   [place] [facility] at [facility], Jesse Sans, MD   11 months ago Community acquired pneumonia of left lower lobe of lung   [place] [facility] at [facility], Jesse Sans, MD   1 year ago Type II diabetes mellitus with complication [facility])    Primary Care and Sports Medicine at [facility], Jesse Sans,  MD       Future Appointments             In 2 months Army Melia, Jesse Sans, MD [facility] Primary Care and Sports Medicine at [facility], [facility]   In 10 months Army Melia, Jesse Sans, MD [facility] Primary Care and Sports Medicine at [facility], [facility]

## 2022-01-22 NOTE — Telephone Encounter (Signed)
Please review.  KP

## 2022-01-22 NOTE — Telephone Encounter (Signed)
Not prescribed by me.

## 2022-01-23 ENCOUNTER — Telehealth: Payer: Self-pay | Admitting: Internal Medicine

## 2022-01-23 DIAGNOSIS — E118 Type 2 diabetes mellitus with unspecified complications: Secondary | ICD-10-CM

## 2022-01-25 NOTE — Telephone Encounter (Signed)
Requested Prescriptions  Pending Prescriptions Disp Refills   OZEMPIC, 2 MG/DOSE, 8 MG/3ML SOPN [Pharmacy Med Name: OZEMPIC 8 MG/3 ML (2 MG/DOSE)] 9 mL 1    Sig: INJECT 2 MG AS DIRECTED ONCE A WEEK.     Endocrinology:  Diabetes - GLP-1 Receptor Agonists - semaglutide Failed - 01/23/2022  9:28 AM      Failed - HBA1C in normal range and within 180 days    Hgb A1c MFr Bld  Date Value Ref Range Status  12/07/2021 7.2 (H) 4.8 - 5.6 % Final    Comment:             Prediabetes: 5.7 - 6.4          Diabetes: >6.4          Glycemic control for adults with diabetes: <7.0          Passed - Cr in normal range and within 360 days    Creatinine, Ser  Date Value Ref Range Status  12/07/2021 0.96 0.76 - 1.27 mg/dL Final         Passed - Valid encounter within last 6 months    Recent Outpatient Visits           1 month ago Annual physical exam   [place] [facility] at [facility], Jesse Sans, MD   6 months ago Type II diabetes mellitus with complication [facility])   [place] [facility] and [facility] Medicine at [facility], Jesse Sans, MD   9 months ago Type II diabetes mellitus with complication [facility])   [place] [facility] and [facility] Medicine at [facility], Jesse Sans, MD   11 months ago Community acquired pneumonia of left lower lobe of lung   [place] [facility] and [facility] Medicine at [facility], Jesse Sans, MD   1 year ago Type II diabetes mellitus with complication [facility])   [place] [facility] and Sports Medicine at [facility], Jesse Sans, MD       Future Appointments             In 2 months Army Melia, Jesse Sans, MD [facility] Primary Care and Sports Medicine at [facility], [facility]   In 10 months Army Melia, Jesse Sans, MD [place] [facility] Care and Sports Medicine at [facility], [facility]

## 2022-02-02 NOTE — Telephone Encounter (Signed)
Pt wants to increase his dosage and speak to a nurse, he is down to three weeks left. Please advise  Best contact: [phone]

## 2022-02-22 ENCOUNTER — Telehealth: Payer: Self-pay | Admitting: Internal Medicine

## 2022-02-22 ENCOUNTER — Other Ambulatory Visit: Payer: Self-pay | Admitting: Internal Medicine

## 2022-02-22 DIAGNOSIS — E118 Type 2 diabetes mellitus with unspecified complications: Secondary | ICD-10-CM

## 2022-02-22 MED ORDER — SEMAGLUTIDE (1 MG/DOSE) 4 MG/3ML ~~LOC~~ SOPN: 2.0000 mg | PEN_INJECTOR | SUBCUTANEOUS | 0 refills | Status: DC

## 2022-02-22 NOTE — Telephone Encounter (Signed)
Pt calling to follow up on his Rx for Semaglutide, 2 MG/DOSE, (OZEMPIC, 2 MG/DOSE,) 8 MG/3ML SOPN   Pt states [facility] not have and does not know when they will get the Ozempic. Pt states [facility] has the 1MG  in stock, if his dr will write a new Rx for double the dose.  [id number] - [place], [place] - [address]

## 2022-02-23 NOTE — Telephone Encounter (Signed)
Completed PA on [email or web address]    (Key: [id number]) Rx #: [id number] Ozempic (1 MG/DOSE) 4MG /3ML pen-injectors  Waiting for outcome.

## 2022-02-24 ENCOUNTER — Other Ambulatory Visit: Payer: Self-pay | Admitting: Internal Medicine

## 2022-02-24 NOTE — Telephone Encounter (Unsigned)
Copied from CRM [id number]. Topic: General - Inquiry >> Feb 24, 2022  2:48 PM Pincus Sanes wrote: Reason for CRM:  Disp Refills Start End Pls fu with this pt went to pu and it is a total of 6 mg and he says it is supposed to be 8 MG pls fu to explain [phone] Semaglutide, 1 MG/DOSE, 4 MG/3ML SOPN

## 2022-03-01 DIAGNOSIS — I25700 Atherosclerosis of coronary artery bypass graft(s), unspecified, with unstable angina pectoris: Secondary | ICD-10-CM | POA: Diagnosis not present

## 2022-03-15 ENCOUNTER — Other Ambulatory Visit: Payer: Self-pay | Admitting: Internal Medicine

## 2022-03-15 DIAGNOSIS — E118 Type 2 diabetes mellitus with unspecified complications: Secondary | ICD-10-CM

## 2022-03-15 DIAGNOSIS — I10 Essential (primary) hypertension: Secondary | ICD-10-CM

## 2022-03-21 ENCOUNTER — Other Ambulatory Visit: Payer: Self-pay | Admitting: Internal Medicine

## 2022-03-21 DIAGNOSIS — G8929 Other chronic pain: Secondary | ICD-10-CM

## 2022-03-23 NOTE — Telephone Encounter (Signed)
Requested medication (s) are due for refill today: Yes  Requested medication (s) are on the active medication list: Yes  Last refill:  09/24/21  Future visit scheduled: Yes  Notes to clinic:  Prescription has expired.    Requested Prescriptions  Pending Prescriptions Disp Refills   gabapentin (NEURONTIN) 300 MG capsule [Pharmacy Med Name: GABAPENTIN 300 MG CAPSULE] 360 capsule 3    Sig: TAKE 1 CAPSULE BY MOUTH FOUR TIMES A DAY     Neurology: Anticonvulsants - gabapentin Passed - 03/21/2022  2:43 PM      Passed - Cr in normal range and within 360 days    Creatinine, Ser  Date Value Ref Range Status  12/07/2021 0.96 0.76 - 1.27 mg/dL Final         Passed - Completed PHQ-2 or PHQ-9 in the last 360 days      Passed - Valid encounter within last 12 months    Recent Outpatient Visits           3 months ago Annual physical exam   [place] [facility] at [facility], Jesse Sans, MD   8 months ago Type II diabetes mellitus with complication [facility])   [place] [facility] at [facility], Jesse Sans, MD   11 months ago Type II diabetes mellitus with complication [facility])   [place] [facility] and [facility] Medicine at [facility], Jesse Sans, MD   1 year ago Community acquired pneumonia of left lower lobe of lung   [place] [facility] Medicine at [facility], Jesse Sans, MD   1 year ago Type II diabetes mellitus with complication [facility])   [place] [facility] and Sports Medicine at [facility], Jesse Sans, MD       Future Appointments             In 3 weeks Army Melia Jesse Sans, MD [facility] Primary Care and Sports Medicine at [facility], [facility]   In 8 months Army Melia, Jesse Sans, MD [place] [facility] Care and Sports Medicine at [facility], [facility]

## 2022-04-16 ENCOUNTER — Encounter: Payer: Self-pay | Admitting: Internal Medicine

## 2022-04-16 ENCOUNTER — Ambulatory Visit: Payer: 59 | Admitting: Internal Medicine

## 2022-04-16 VITALS — BP 122/64 | HR 74 | Ht 71.0 in | Wt 258.0 lb

## 2022-04-16 DIAGNOSIS — E1169 Type 2 diabetes mellitus with other specified complication: Secondary | ICD-10-CM

## 2022-04-16 DIAGNOSIS — R972 Elevated prostate specific antigen [PSA]: Secondary | ICD-10-CM | POA: Diagnosis not present

## 2022-04-16 DIAGNOSIS — E118 Type 2 diabetes mellitus with unspecified complications: Secondary | ICD-10-CM | POA: Diagnosis not present

## 2022-04-16 DIAGNOSIS — E785 Hyperlipidemia, unspecified: Secondary | ICD-10-CM

## 2022-04-16 NOTE — Progress Notes (Signed)
Date:  04/16/2022   Name:  Wyatt Mata   DOB:  [birth year]   MRN:  [id number]   Chief Complaint: Diabetes  Diabetes He presents for his follow-up diabetic visit. He has type 2 diabetes mellitus. His disease course has been stable. Pertinent negatives for hypoglycemia include no headaches or tremors. Pertinent negatives for diabetes include no chest pain, no fatigue, no polydipsia and no polyuria. Current diabetic treatments: metformin, jardiance, ozempic. There is no compliance with monitoring of blood glucose.  Hypertension This is a chronic problem. The problem is controlled. Pertinent negatives include no chest pain, headaches, palpitations or shortness of breath. Past treatments include calcium channel blockers, ACE inhibitors and beta blockers. The current treatment provides significant improvement.   Elevated PSA - last visit PSA was >4.  He denies prostate related symptoms.  No hematuria, hesitancy or urgency.  He sees Dr. Ronalee Red for testicular pain and would see him again if needed.  Lab Results  Component Value Date   PSA1 4.2 (H) 12/07/2021   PSA1 1.9 12/02/2020   PSA1 1.9 10/19/2019   PSA 1.6 01/07/2014     Lab Results  Component Value Date   NA 140 12/07/2021   K 5.0 12/07/2021   CO2 23 12/07/2021   GLUCOSE 132 (H) 12/07/2021   BUN 16 12/07/2021   CREATININE 0.96 12/07/2021   CALCIUM 9.6 12/07/2021   EGFR 87 12/07/2021   GFRNONAA 75 06/20/2020   Lab Results  Component Value Date   CHOL 57 (L) 12/07/2021   HDL 33 (L) 12/07/2021   LDLCALC 6 12/07/2021   TRIG 91 12/07/2021   CHOLHDL 1.7 12/07/2021   Lab Results  Component Value Date   TSH 1.150 10/13/2018   Lab Results  Component Value Date   HGBA1C 7.2 (H) 12/07/2021   Lab Results  Component Value Date   WBC 5.6 12/07/2021   HGB 17.9 (H) 12/07/2021   HCT 48.1 12/07/2021   MCV 88 12/07/2021   PLT 173 12/07/2021   Lab Results  Component Value Date   ALT 28 12/07/2021   AST 19 12/07/2021    ALKPHOS 50 12/07/2021   BILITOT 0.4 12/07/2021   No results found for: "25OHVITD2", "25OHVITD3", "VD25OH"   Review of Systems  Constitutional:  Negative for appetite change, fatigue and unexpected weight change.  Eyes:  Negative for visual disturbance.  Respiratory:  Negative for cough, shortness of breath and wheezing.   Cardiovascular:  Negative for chest pain, palpitations and leg swelling.  Gastrointestinal:  Negative for abdominal pain and blood in stool.  Endocrine: Negative for polydipsia and polyuria.  Genitourinary:  Negative for dysuria, frequency, hematuria and urgency.  Skin:  Negative for color change and rash.  Neurological:  Negative for tremors, numbness and headaches.  Psychiatric/Behavioral:  Negative for dysphoric mood.     Patient Active Problem List   Diagnosis Date Noted   Osteoarthritis of left knee 09/10/2021   BMI 36.0-36.9,adult 01/02/2021   Neck pain 02/29/2020   Coronary artery disease involving coronary bypass graft of native heart with angina pectoris ([place]) 10/13/2018   AAA (abdominal aortic aneurysm) ([place]) 02/12/2018   Spondylolisthesis of lumbar region 11/30/2017   Lumbar radiculopathy 11/11/2017   DDD (degenerative disc disease), lumbar 10/10/2017   Type II diabetes mellitus with complication ([place]) 40/98/1191   Low serum testosterone 10/10/2015   Hematuria, gross 10/15/2014   Allergic rhinitis 08/05/2014   Hyperlipidemia associated with type 2 diabetes mellitus ([place]) 08/05/2014   Epididymal pain 08/05/2014  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   Essential (primary) hypertension 08/05/2014   Acid reflux 08/05/2014   History of colon polyps 08/05/2014   Obstructive apnea 08/05/2014   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    Allergies  Allergen Reactions   Cyclobenzaprine Other (See Comments)    Abdominal pain    Past Surgical History:  Procedure Laterality Date   COLONOSCOPY   08/16/2014   tubular adenoma   COLONOSCOPY  08/24/2017   one polyp - repeat 5 yrs   CORONARY ANGIOPLASTY N/A 07/2020   CORONARY ANGIOPLASTY WITH STENT PLACEMENT  2013   CORONARY ANGIOPLASTY WITH STENT PLACEMENT  01/2015   2 stents  - post circ and OM2   CORONARY ARTERY BYPASS GRAFT  2004    Social History   Tobacco Use   Smoking status: Former   Smokeless tobacco: Never  Vaping Use   Vaping Use: Never used  Substance Use Topics   Alcohol use: No    Alcohol/week: 0.0 standard drinks of alcohol   Drug use: No     Medication list has been reviewed and updated.  Current Meds  Medication Sig   albuterol (VENTOLIN HFA) 108 (90 Base) MCG/ACT inhaler TAKE 2 PUFFS BY MOUTH EVERY 6 HOURS AS NEEDED FOR WHEEZE OR SHORTNESS OF BREATH   amLODipine (NORVASC) 5 MG tablet TAKE 1 TABLET (5 MG TOTAL) BY MOUTH DAILY.   Aspirin-Acetaminophen-Caffeine 500-325-65 MG PACK Take 1 packet by mouth 2 (two) times daily. [place]   atorvastatin (LIPITOR) 40 MG tablet TAKE 1 TABLET BY MOUTH EVERY DAY   Choline Fenofibrate (FENOFIBRIC ACID) 135 MG CPDR TAKE 1 CAPSULE BY MOUTH EVERY DAY   clopidogrel (PLAVIX) 75 MG tablet Take 75 mg by mouth daily.   gabapentin (NEURONTIN) 300 MG capsule TAKE 1 CAPSULE BY MOUTH FOUR TIMES A DAY   isosorbide mononitrate (IMDUR) 30 MG 24 hr tablet Take 30 mg by mouth daily.   JARDIANCE 25 MG TABS tablet TAKE 1 TABLET (25 MG TOTAL) BY MOUTH DAILY.   lisinopril (ZESTRIL) 10 MG tablet TAKE 1 TABLET BY MOUTH EVERY DAY   metFORMIN (GLUCOPHAGE-XR) 500 MG 24 hr tablet Take 1 tablet (500 mg total) by mouth daily with breakfast.   metoprolol succinate (TOPROL-XL) 100 MG 24 hr tablet Take 1 tablet (100 mg total) by mouth daily. Take with or immediately following a meal.   REPATHA SURECLICK 140 MG/ML SOAJ Inject into the skin every 14 (fourteen) days.   Semaglutide, 1 MG/DOSE, 4 MG/3ML SOPN Inject 2 mg as directed once a week.   sildenafil (VIAGRA) 100 MG tablet TAKE 1 TABLET BY MOUTH AS  NEEDED FOR ERECTILE DYSFUNCTION, AS DIRECTED.       04/16/2022    8:37 AM 12/07/2021    8:05 AM 07/24/2021    8:43 AM 04/10/2021    8:25 AM  GAD 7 : Generalized Anxiety Score  Nervous, Anxious, on Edge 0 0 0 0  Control/stop worrying 0 0 0 0  Worry too much - different things 0 0 0 0  Trouble relaxing 0 0 0 0  Restless 0 0 0 0  Easily annoyed or irritable 0 0 0 0  Afraid - awful might happen 0 0 0 0  Total GAD 7 Score 0 0 0 0  Anxiety Difficulty Not difficult at all Not difficult at all  Not difficult at all       04/16/2022    8:31 AM 12/07/2021    8:05  AM 07/24/2021    8:43 AM  Depression screen PHQ 2/9  Decreased Interest 0 0 0  Down, Depressed, Hopeless 0 0 0  PHQ - 2 Score 0 0 0  Altered sleeping 0 0 0  Tired, decreased energy 0 0 0  Change in appetite 0 0 0  Feeling bad or failure about yourself  0 0 0  Trouble concentrating 0 0 0  Moving slowly or fidgety/restless 0 0 0  Suicidal thoughts 0 0 0  PHQ-9 Score 0 0 0  Difficult doing work/chores Not difficult at all Not difficult at all Not difficult at all    BP Readings from Last 3 Encounters:  04/16/22 122/64  12/07/21 124/60  07/24/21 132/82    Physical Exam Vitals and nursing note reviewed.  Constitutional:      General: He is not in acute distress.    Appearance: Normal appearance. He is well-developed.  HENT:     Head: Normocephalic and atraumatic.  Neck:     Vascular: No carotid bruit.  Cardiovascular:     Rate and Rhythm: Normal rate and regular rhythm.     Pulses: Normal pulses.  Pulmonary:     Effort: Pulmonary effort is normal. No respiratory distress.     Breath sounds: No wheezing or rhonchi.  Musculoskeletal:     Cervical back: Normal range of motion.     Right lower leg: No edema.     Left lower leg: No edema.  Lymphadenopathy:     Cervical: No cervical adenopathy.  Skin:    General: Skin is warm and dry.     Findings: No rash.  Neurological:     General: No focal deficit present.      Mental Status: He is alert and oriented to person, place, and time.  Psychiatric:        Mood and Affect: Mood normal.        Behavior: Behavior normal.     Wt Readings from Last 3 Encounters:  04/16/22 258 lb (117 kg)  12/07/21 256 lb 6.4 oz (116.3 kg)  07/24/21 255 lb 6.4 oz (115.8 kg)    BP 122/64   Pulse 74   Ht 5\' 11"  (1.803 m)   Wt 258 lb (117 kg)   SpO2 96%   BMI 35.98 kg/m   Assessment and Plan: Problem List Items Addressed This Visit       Endocrine   Hyperlipidemia associated with type 2 diabetes mellitus ([place]) (Chronic)    Tolerating Repatha without side effects at this time LDL is  Lab Results  Component Value Date   LDLCALC 6 12/07/2021  with a goal of < 70.        Type II diabetes mellitus with complication (HCC) (Chronic)    Clinically stable without s/s of hypoglycemia on ozempic, metformin and jardiance Tolerating medications well without side effects or other concerns. Continue without changes at this time.       Relevant Orders   Hemoglobin A1c   Other Visit Diagnoses     Elevated PSA, less than 10 ng/ml    -  Primary   no symptoms of BPH or obstruction noted will refer back to [facility] if still elevated   Relevant Orders   PSA        Partially dictated using [place]. Any errors are unintentional.  Halina Maidens, MD [facility]  04/16/2022

## 2022-04-16 NOTE — Assessment & Plan Note (Signed)
Clinically stable without s/s of hypoglycemia on ozempic, metformin and jardiance Tolerating medications well without side effects or other concerns. Continue without changes at this time.

## 2022-04-16 NOTE — Assessment & Plan Note (Signed)
Tolerating Repatha without side effects at this time LDL is  Lab Results  Component Value Date   LDLCALC 6 12/07/2021   with a goal of < 70.

## 2022-04-17 LAB — PSA: Prostate Specific Ag, Serum: 2 ng/mL (ref 0.0–4.0)

## 2022-04-17 LAB — HEMOGLOBIN A1C
Est. average glucose Bld gHb Est-mCnc: 169 mg/dL
Hgb A1c MFr Bld: 7.5 % — ABNORMAL HIGH (ref 4.8–5.6)

## 2022-04-29 ENCOUNTER — Other Ambulatory Visit: Payer: Self-pay | Admitting: Internal Medicine

## 2022-04-29 DIAGNOSIS — I10 Essential (primary) hypertension: Secondary | ICD-10-CM

## 2022-04-29 NOTE — Telephone Encounter (Signed)
Requested Prescriptions  Pending Prescriptions Disp Refills   metoprolol succinate (TOPROL-XL) 100 MG 24 hr tablet [Pharmacy Med Name: METOPROLOL SUCC ER 100 MG TAB] 90 tablet 1    Sig: TAKE 1 TABLET BY MOUTH DAILY. TAKE WITH OR IMMEDIATELY FOLLOWING A MEAL.     Cardiovascular:  Beta Blockers Passed - 04/29/2022  1:12 AM      Passed - Last BP in normal range    BP Readings from Last 1 Encounters:  04/16/22 122/64         Passed - Last Heart Rate in normal range    Pulse Readings from Last 1 Encounters:  04/16/22 74         Passed - Valid encounter within last 6 months    Recent Outpatient Visits           1 week ago Elevated PSA, less than 10 ng/ml   [facility] at [facility], Jesse Sans, MD   4 months ago Annual physical exam   [place] at [facility], Jesse Sans, MD   9 months ago Type II diabetes mellitus with complication [facility])   [place] [facility] at [facility], Jesse Sans, MD   1 year ago Type II diabetes mellitus with complication [facility])   [place] [facility] at [facility], Jesse Sans, MD   1 year ago Community acquired pneumonia of left lower lobe of lung   [facility] at [facility], Jesse Sans, MD       Future Appointments             In 3 months Army Melia, Jesse Sans, MD [place] at [facility], [facility]   In 7 months Army Melia, Jesse Sans, MD [place] at [facility], [facility]

## 2022-05-19 ENCOUNTER — Other Ambulatory Visit: Payer: Self-pay | Admitting: Internal Medicine

## 2022-05-19 DIAGNOSIS — E118 Type 2 diabetes mellitus with unspecified complications: Secondary | ICD-10-CM

## 2022-05-19 NOTE — Telephone Encounter (Signed)
Requested Prescriptions  Pending Prescriptions Disp Refills   metFORMIN (GLUCOPHAGE-XR) 500 MG 24 hr tablet [Pharmacy Med Name: METFORMIN HCL ER 500 MG TABLET] 90 tablet 1    Sig: TAKE 1 TABLET BY [place]     Endocrinology:  Diabetes - Biguanides Passed - 05/19/2022  2:23 AM      Passed - Cr in normal range and within 360 days    Creatinine, Ser  Date Value Ref Range Status  12/07/2021 0.96 0.76 - 1.27 mg/dL Final         Passed - HBA1C is between 0 and 7.9 and within 180 days    Hgb A1c MFr Bld  Date Value Ref Range Status  04/16/2022 7.5 (H) 4.8 - 5.6 % Final    Comment:             Prediabetes: 5.7 - 6.4          Diabetes: >6.4          Glycemic control for adults with diabetes: <7.0          Passed - eGFR in normal range and within 360 days    GFR calc Af Amer  Date Value Ref Range Status  02/29/2020 82 >59 mL/min/1.73 Final    Comment:    **In accordance with recommendations from the NKF-ASN Task force,**   [facility] is in the process of updating its eGFR calculation to the   2021 CKD-EPI creatinine equation that estimates kidney function   without a race variable.    GFR calc non Af Amer  Date Value Ref Range Status  06/20/2020 75  Final   eGFR  Date Value Ref Range Status  12/07/2021 87 >59 mL/min/1.73 Final         Passed - B12 Level in normal range and within 720 days    Vitamin B-12  Date Value Ref Range Status  12/02/2020 953 232 - 1,245 pg/mL Final         Passed - Valid encounter within last 6 months    Recent Outpatient Visits           1 month ago Elevated PSA, less than 10 ng/ml   [facility] at [facility], Jesse Sans, MD   5 months ago Annual physical exam   [place] at [facility], Jesse Sans, MD   9 months ago Type II diabetes mellitus with complication [facility])   [place] [facility] at [facility],  Jesse Sans, MD   1 year ago Type II diabetes mellitus with complication [facility])   [place] [facility] Medicine at [facility], Jesse Sans, MD   1 year ago Community acquired pneumonia of left lower lobe of lung   [place] [facility] Medicine at [facility], Jesse Sans, MD       Future Appointments             In 3 months Army Melia Jesse Sans, MD [place] at [facility], [facility]   In 6 months Army Melia Jesse Sans, MD [place] at [facility], [place] within normal limits and completed in the last 12 months    WBC  Date Value Ref Range Status  12/07/2021 5.6 3.4 -  10.8 x10E3/uL Final   RBC  Date Value Ref Range Status  12/07/2021 5.49 4.14 - 5.80 x10E6/uL Final  10/09/2014 100 (A) 4.69 - 6.13 M/uL Preliminary   Hemoglobin  Date Value Ref Range Status  12/07/2021 17.9 (H) 13.0 - 17.7 g/dL Final   Hematocrit  Date Value Ref Range Status  12/07/2021 48.1 37.5 - 51.0 % Final   MCHC  Date Value Ref Range Status  12/07/2021 37.2 (H) 31.5 - 35.7 g/dL Final   [facility] [place]  Date Value Ref Range Status  12/07/2021 32.6 26.6 - 33.0 pg Final   MCV  Date Value Ref Range Status  12/07/2021 88 79 - 97 fL Final   No results found for: "PLTCOUNTKUC", "LABPLAT", "POCPLA" RDW  Date Value Ref Range Status  12/07/2021 13.8 11.6 - 15.4 % Final

## 2022-07-20 ENCOUNTER — Other Ambulatory Visit: Payer: Self-pay | Admitting: Internal Medicine

## 2022-07-20 DIAGNOSIS — E118 Type 2 diabetes mellitus with unspecified complications: Secondary | ICD-10-CM

## 2022-07-21 ENCOUNTER — Other Ambulatory Visit: Payer: Self-pay | Admitting: Internal Medicine

## 2022-07-27 ENCOUNTER — Ambulatory Visit: Payer: Self-pay | Admitting: *Deleted

## 2022-07-27 ENCOUNTER — Other Ambulatory Visit: Payer: Self-pay | Admitting: Internal Medicine

## 2022-07-27 DIAGNOSIS — E1169 Type 2 diabetes mellitus with other specified complication: Secondary | ICD-10-CM

## 2022-07-27 NOTE — Telephone Encounter (Signed)
Patient called and declined to verify name and DOB with NT after multiple attempts and reports he has already verified with "everyone " else. Patient reports he needs to be calling pharmacy to request refill for gabapentin and not "you".  Reason for Disposition  General information question, no triage required and triager able to answer question  Answer Assessment - Initial Assessment Questions 1. REASON FOR CALL or QUESTION: "What is your reason for calling today?" or "How can I best help you?" or "What question do you have that I can help answer?"     Patient reports he needs to call pharmacy and not "you"  Protocols used: Information Only Call - No Triage-A-AH

## 2022-07-29 ENCOUNTER — Telehealth: Payer: Self-pay | Admitting: Internal Medicine

## 2022-07-29 ENCOUNTER — Other Ambulatory Visit: Payer: Self-pay | Admitting: Internal Medicine

## 2022-07-29 DIAGNOSIS — I10 Essential (primary) hypertension: Secondary | ICD-10-CM

## 2022-07-29 NOTE — Telephone Encounter (Signed)
Refused Toprol XL 100 mg because it's being requested too early.  04/29/2022 #90, 1 refill was given

## 2022-07-29 NOTE — Telephone Encounter (Signed)
Copied from CRM [id number]. Topic: General - Other >> Jul 29, 2022  3:47 PM Dominique A wrote: Reason for CRM: Pt is calling stating that his Pharmacy advised him that the office did not approve his ISO. Pt would like to speak with his PCP regarding this.

## 2022-07-30 NOTE — Telephone Encounter (Signed)
Called and left VM informing patient Dr Judithann Graves does not prescribe this and never has prescribed this. He will need to get this from his cardiologist.  - Terri Skains

## 2022-08-20 ENCOUNTER — Other Ambulatory Visit: Payer: Self-pay | Admitting: Internal Medicine

## 2022-08-20 DIAGNOSIS — E118 Type 2 diabetes mellitus with unspecified complications: Secondary | ICD-10-CM

## 2022-08-26 ENCOUNTER — Encounter: Payer: Self-pay | Admitting: Internal Medicine

## 2022-08-26 ENCOUNTER — Other Ambulatory Visit: Payer: Self-pay | Admitting: Internal Medicine

## 2022-08-26 ENCOUNTER — Ambulatory Visit: Payer: 59 | Admitting: Internal Medicine

## 2022-08-26 VITALS — BP 124/68 | HR 83 | Ht 71.0 in | Wt 248.0 lb

## 2022-08-26 DIAGNOSIS — N50819 Testicular pain, unspecified: Secondary | ICD-10-CM | POA: Diagnosis not present

## 2022-08-26 DIAGNOSIS — E118 Type 2 diabetes mellitus with unspecified complications: Secondary | ICD-10-CM

## 2022-08-26 DIAGNOSIS — I25709 Atherosclerosis of coronary artery bypass graft(s), unspecified, with unspecified angina pectoris: Secondary | ICD-10-CM

## 2022-08-26 DIAGNOSIS — E785 Hyperlipidemia, unspecified: Secondary | ICD-10-CM | POA: Diagnosis not present

## 2022-08-26 DIAGNOSIS — I714 Abdominal aortic aneurysm, without rupture, unspecified: Secondary | ICD-10-CM

## 2022-08-26 DIAGNOSIS — Z7984 Long term (current) use of oral hypoglycemic drugs: Secondary | ICD-10-CM | POA: Diagnosis not present

## 2022-08-26 DIAGNOSIS — E1169 Type 2 diabetes mellitus with other specified complication: Secondary | ICD-10-CM | POA: Diagnosis not present

## 2022-08-26 DIAGNOSIS — Z1211 Encounter for screening for malignant neoplasm of colon: Secondary | ICD-10-CM

## 2022-08-26 DIAGNOSIS — I10 Essential (primary) hypertension: Secondary | ICD-10-CM | POA: Diagnosis not present

## 2022-08-26 MED ORDER — ATORVASTATIN CALCIUM 40 MG PO TABS: 40.0000 mg | ORAL_TABLET | Freq: Every day | ORAL | 3 refills | Status: DC

## 2022-08-26 MED ORDER — EMPAGLIFLOZIN 25 MG PO TABS: 25.0000 mg | ORAL_TABLET | Freq: Every day | ORAL | 1 refills | Status: DC

## 2022-08-26 MED ORDER — LISINOPRIL 10 MG PO TABS: 10.0000 mg | ORAL_TABLET | Freq: Every day | ORAL | 1 refills | Status: DC

## 2022-08-26 MED ORDER — AMLODIPINE BESYLATE 5 MG PO TABS: 5.0000 mg | ORAL_TABLET | Freq: Every day | ORAL | 1 refills | Status: DC

## 2022-08-26 NOTE — Progress Notes (Signed)
Date:  08/26/2022   Name:  Wyatt Mata   DOB:  Sep 21, 1955   MRN:  [id number]   Chief Complaint: Diabetes  Diabetes He presents for his follow-up diabetic visit. He has type 2 diabetes mellitus. Pertinent negatives for hypoglycemia include no headaches or tremors. Pertinent negatives for diabetes include no chest pain, no fatigue, no polydipsia and no polyuria. Current diabetic treatment includes oral agent (triple therapy). He is compliant with treatment all of the time.  Hypertension This is a chronic problem. The problem is controlled. Pertinent negatives include no chest pain, headaches, palpitations or shortness of breath.  Hyperlipidemia This is a chronic problem. The problem is controlled. Pertinent negatives include no chest pain or shortness of breath. Current antihyperlipidemic treatment includes statins and fibric acid derivatives (and Repatha). The current treatment provides significant improvement of lipids.    Lab Results  Component Value Date   NA 140 12/07/2021   K 5.0 12/07/2021   CO2 23 12/07/2021   GLUCOSE 132 (H) 12/07/2021   BUN 16 12/07/2021   CREATININE 0.96 12/07/2021   CALCIUM 9.6 12/07/2021   EGFR 87 12/07/2021   GFRNONAA 75 06/20/2020   Lab Results  Component Value Date   CHOL 57 (L) 12/07/2021   HDL 33 (L) 12/07/2021   LDLCALC 6 12/07/2021   TRIG 91 12/07/2021   CHOLHDL 1.7 12/07/2021   Lab Results  Component Value Date   TSH 1.150 10/13/2018   Lab Results  Component Value Date   HGBA1C 7.5 (H) 04/16/2022   Lab Results  Component Value Date   WBC 5.6 12/07/2021   HGB 17.9 (H) 12/07/2021   HCT 48.1 12/07/2021   MCV 88 12/07/2021   PLT 173 12/07/2021   Lab Results  Component Value Date   ALT 28 12/07/2021   AST 19 12/07/2021   ALKPHOS 50 12/07/2021   BILITOT 0.4 12/07/2021   No results found for: "25OHVITD2", "25OHVITD3", "VD25OH"   Review of Systems  Constitutional:  Negative for appetite change, chills, fatigue, fever and  unexpected weight change.  Eyes:  Negative for visual disturbance.  Respiratory:  Negative for cough, shortness of breath and wheezing.   Cardiovascular:  Negative for chest pain, palpitations and leg swelling.  Gastrointestinal:  Positive for abdominal pain (low mid abd pain). Negative for blood in stool.  Endocrine: Negative for polydipsia and polyuria.  Genitourinary:  Negative for dysuria and hematuria.  Skin:  Negative for color change and rash.  Neurological:  Negative for tremors, numbness and headaches.  Psychiatric/Behavioral:  Negative for dysphoric mood.     Patient Active Problem List   Diagnosis Date Noted   Osteoarthritis of left knee 09/10/2021   BMI 36.0-36.9,adult 01/02/2021   Neck pain 02/29/2020   Coronary artery disease involving coronary bypass graft of native heart with angina pectoris (HCC) 10/13/2018   AAA (abdominal aortic aneurysm) (HCC) 02/12/2018   Spondylolisthesis of lumbar region 11/30/2017   Lumbar radiculopathy 11/11/2017   DDD (degenerative disc disease), lumbar 10/10/2017   Type II diabetes mellitus with complication (HCC) 10/10/2015   Low serum testosterone 10/10/2015   Hematuria, gross 10/15/2014   Allergic rhinitis 08/05/2014   Hyperlipidemia associated with type 2 diabetes mellitus (HCC) 08/05/2014   Epididymal pain 08/05/2014   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   Essential (primary) hypertension 08/05/2014   Acid reflux 08/05/2014   History of colon polyps 08/05/2014   Obstructive apnea 08/05/2014   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  Allergies  Allergen Reactions   Cyclobenzaprine Other (See Comments)    Abdominal pain    Past Surgical History:  Procedure Laterality Date   COLONOSCOPY  08/16/2014   tubular adenoma   COLONOSCOPY  08/24/2017   one polyp - repeat 5 yrs   CORONARY ANGIOPLASTY N/A 07/2020   CORONARY ANGIOPLASTY WITH STENT PLACEMENT  2013   CORONARY  ANGIOPLASTY WITH STENT PLACEMENT  01/2015   2 stents  - post circ and OM2   CORONARY ARTERY BYPASS GRAFT  2004    Social History   Tobacco Use   Smoking status: Former   Smokeless tobacco: Never  Vaping Use   Vaping Use: Never used  Substance Use Topics   Alcohol use: No    Alcohol/week: 0.0 standard drinks of alcohol   Drug use: No     Medication list has been reviewed and updated.  Current Meds  Medication Sig   albuterol (VENTOLIN HFA) 108 (90 Base) MCG/ACT inhaler TAKE 2 PUFFS BY MOUTH EVERY 6 HOURS AS NEEDED FOR WHEEZE OR SHORTNESS OF BREATH   Aspirin-Acetaminophen-Caffeine 500-325-65 MG PACK Take 1 packet by mouth 2 (two) times daily. Goodys   Choline Fenofibrate (FENOFIBRIC ACID) 135 MG CPDR TAKE 1 CAPSULE BY MOUTH EVERY DAY   clopidogrel (PLAVIX) 75 MG tablet Take 75 mg by mouth daily.   gabapentin (NEURONTIN) 300 MG capsule TAKE 1 CAPSULE BY MOUTH FOUR TIMES A DAY   isosorbide mononitrate (IMDUR) 30 MG 24 hr tablet Take 30 mg by mouth daily.   metFORMIN (GLUCOPHAGE-XR) 500 MG 24 hr tablet TAKE 1 TABLET BY MOUTH EVERY DAY WITH BREAKFAST   metoprolol succinate (TOPROL-XL) 100 MG 24 hr tablet TAKE 1 TABLET BY MOUTH DAILY. TAKE WITH OR IMMEDIATELY FOLLOWING A MEAL.   REPATHA SURECLICK 140 MG/ML SOAJ Inject into the skin every 14 (fourteen) days.   Semaglutide, 2 MG/DOSE, (OZEMPIC, 2 MG/DOSE,) 8 MG/3ML SOPN INJECT 2 MG AS DIRECTED ONCE A WEEK.   sildenafil (VIAGRA) 100 MG tablet TAKE 1 TABLET BY MOUTH AS NEEDED FOR ERECTILE DYSFUNCTION, AS DIRECTED.   [DISCONTINUED] amLODipine (NORVASC) 5 MG tablet TAKE 1 TABLET (5 MG TOTAL) BY MOUTH DAILY.   [DISCONTINUED] atorvastatin (LIPITOR) 40 MG tablet TAKE 1 TABLET BY MOUTH EVERY DAY   [DISCONTINUED] JARDIANCE 25 MG TABS tablet TAKE 1 TABLET (25 MG TOTAL) BY MOUTH DAILY.   [DISCONTINUED] lisinopril (ZESTRIL) 10 MG tablet TAKE 1 TABLET BY MOUTH EVERY DAY       08/26/2022    8:37 AM 04/16/2022    8:37 AM 12/07/2021    8:05 AM  07/24/2021    8:43 AM  GAD 7 : Generalized Anxiety Score  Nervous, Anxious, on Edge 0 0 0 0  Control/stop worrying 0 0 0 0  Worry too much - different things 0 0 0 0  Trouble relaxing 0 0 0 0  Restless 0 0 0 0  Easily annoyed or irritable 0 0 0 0  Afraid - awful might happen 0 0 0 0  Total GAD 7 Score 0 0 0 0  Anxiety Difficulty Not difficult at all Not difficult at all Not difficult at all        08/26/2022    8:37 AM 04/16/2022    8:31 AM 12/07/2021    8:05 AM  Depression screen PHQ 2/9  Decreased Interest 0 0 0  Down, Depressed, Hopeless 0 0 0  PHQ - 2 Score 0 0 0  Altered sleeping 0 0 0  Tired, decreased  energy 1 0 0  Change in appetite 0 0 0  Feeling bad or failure about yourself  0 0 0  Trouble concentrating 0 0 0  Moving slowly or fidgety/restless 0 0 0  Suicidal thoughts 0 0 0  PHQ-9 Score 1 0 0  Difficult doing work/chores Not difficult at all Not difficult at all Not difficult at all    BP Readings from Last 3 Encounters:  08/26/22 124/68  04/16/22 122/64  12/07/21 124/60    Physical Exam Vitals and nursing note reviewed.  Constitutional:      General: He is not in acute distress.    Appearance: He is well-developed.  HENT:     Head: Normocephalic and atraumatic.  Cardiovascular:     Rate and Rhythm: Normal rate and regular rhythm.     Pulses: Normal pulses.  Pulmonary:     Effort: Pulmonary effort is normal. No respiratory distress.     Breath sounds: No wheezing or rhonchi.  Musculoskeletal:     Cervical back: Normal range of motion.     Right lower leg: No edema.     Left lower leg: No edema.  Lymphadenopathy:     Cervical: No cervical adenopathy.  Skin:    General: Skin is warm and dry.     Findings: No rash.     Comments: Fungal nail changes left great and second toes  Neurological:     Mental Status: He is alert and oriented to person, place, and time.  Psychiatric:        Mood and Affect: Mood normal.        Behavior: Behavior normal.      Wt Readings from Last 3 Encounters:  08/26/22 248 lb (112.5 kg)  04/16/22 258 lb (117 kg)  12/07/21 256 lb 6.4 oz (116.3 kg)    BP 124/68   Pulse 83   Ht 5\' 11"  (1.803 m)   Wt 248 lb (112.5 kg)   SpO2 95%   BMI 34.59 kg/m   Assessment and Plan:  Problem List Items Addressed This Visit     Type II diabetes mellitus with complication (HCC) - Primary (Chronic)    Blood sugars stable without hypoglycemic symptoms or events. Currently being treated with ozempic, metformin and Jardiance. Lab Results  Component Value Date   HGBA1C 7.5 (H) 04/16/2022       Relevant Medications   atorvastatin (LIPITOR) 40 MG tablet   empagliflozin (JARDIANCE) 25 MG TABS tablet   lisinopril (ZESTRIL) 10 MG tablet   Other Relevant Orders   Hemoglobin A1c   Hyperlipidemia associated with type 2 diabetes mellitus (HCC) (Chronic)   Relevant Medications   atorvastatin (LIPITOR) 40 MG tablet   empagliflozin (JARDIANCE) 25 MG TABS tablet   lisinopril (ZESTRIL) 10 MG tablet   amLODipine (NORVASC) 5 MG tablet   Essential (primary) hypertension (Chronic)    Stable exam with well controlled BP.  Currently taking lisinopril, metoprolol and amlodipine. Tolerating medications without concerns or side effects. Will continue to recommend low sodium diet and current regimen.       Relevant Medications   atorvastatin (LIPITOR) 40 MG tablet   lisinopril (ZESTRIL) 10 MG tablet   amLODipine (NORVASC) 5 MG tablet   Epididymal pain    On gabapentin bid - unable to tolerate higher doses due to fatigue. Recent lower abdominal pain improved with gabapentin so may be related Will continue gabapentin 600 mg bid.      Coronary artery disease involving coronary bypass graft  of native heart with angina pectoris (HCC) (Chronic)   Relevant Medications   atorvastatin (LIPITOR) 40 MG tablet   lisinopril (ZESTRIL) 10 MG tablet   amLODipine (NORVASC) 5 MG tablet   AAA (abdominal aortic aneurysm) (HCC) (Chronic)    Relevant Medications   atorvastatin (LIPITOR) 40 MG tablet   lisinopril (ZESTRIL) 10 MG tablet   amLODipine (NORVASC) 5 MG tablet   Other Visit Diagnoses     Long term current use of oral hypoglycemic drug           No follow-ups on file.   Partially dictated using Dragon software, any errors are not intentional.  Reubin Milan, MD [facility] [place], [place]

## 2022-08-26 NOTE — Patient Instructions (Signed)
Rolene Arbour, MD - Call to schedule colonoscopy [address]  [place], [place] [address]  [phone] (Work)  [phone] (Fax

## 2022-08-26 NOTE — Assessment & Plan Note (Signed)
Stable exam with well controlled BP.  Currently taking lisinopril, metoprolol and amlodipine. Tolerating medications without concerns or side effects. Will continue to recommend low sodium diet and current regimen.

## 2022-08-26 NOTE — Assessment & Plan Note (Addendum)
Blood sugars stable without hypoglycemic symptoms or events. Currently being treated with ozempic, metformin and Jardiance. Lab Results  Component Value Date   HGBA1C 7.5 (H) 04/16/2022

## 2022-08-26 NOTE — Assessment & Plan Note (Addendum)
On gabapentin bid - unable to tolerate higher doses due to fatigue. Recent lower abdominal pain improved with gabapentin so may be related Will continue gabapentin 600 mg bid.

## 2022-08-27 LAB — HEMOGLOBIN A1C
Est. average glucose Bld gHb Est-mCnc: 146 mg/dL
Hgb A1c MFr Bld: 6.7 % — ABNORMAL HIGH (ref 4.8–5.6)

## 2022-09-07 ENCOUNTER — Telehealth: Payer: Self-pay | Admitting: Internal Medicine

## 2022-09-07 NOTE — Telephone Encounter (Signed)
Reached out to referrals team. Waiting on response.  KP

## 2022-09-07 NOTE — Telephone Encounter (Signed)
New referral sent.  KP 

## 2022-09-07 NOTE — Telephone Encounter (Signed)
Copied from CRM [id number]. Topic: Referral - Question >> Sep 07, 2022 11:35 AM Marlow Baars wrote: Reason for CRM: The patient called in stating he will not go to [facility] for his colonscopy but he will go to [facility] or [facility] on [address] where he had it last time. Please assist him further to reschedule to a different location.

## 2022-09-17 DIAGNOSIS — I25700 Atherosclerosis of coronary artery bypass graft(s), unspecified, with unstable angina pectoris: Secondary | ICD-10-CM | POA: Diagnosis not present

## 2022-09-17 DIAGNOSIS — I10 Essential (primary) hypertension: Secondary | ICD-10-CM | POA: Diagnosis not present

## 2022-09-17 DIAGNOSIS — E782 Mixed hyperlipidemia: Secondary | ICD-10-CM | POA: Diagnosis not present

## 2022-09-22 NOTE — Telephone Encounter (Addendum)
Patient is calling to report that he is requesting referral to be sent to [facility] [place], [place]  [address] - patient was advised that he needs to go to [facility] hospital to have colonoscopy because he has stints- but he is too emotional to go to the hospital. Pt is frustrated that he has not heard a response back. Please advise with patient CB- [phone]

## 2022-09-22 NOTE — Telephone Encounter (Signed)
Reached out to referrals team.  KP 

## 2022-09-22 NOTE — Telephone Encounter (Signed)
Called pt informed him that our referrals team called the office and we are working on his referral. Pt verbalized understanding.  KP

## 2022-09-22 NOTE — Telephone Encounter (Signed)
Referrals team left a message with the office. She also put a message in the referral that [facility] wants to [facility].  KP

## 2022-10-28 ENCOUNTER — Other Ambulatory Visit: Payer: Self-pay | Admitting: Internal Medicine

## 2022-10-28 DIAGNOSIS — E118 Type 2 diabetes mellitus with unspecified complications: Secondary | ICD-10-CM

## 2022-10-28 DIAGNOSIS — I10 Essential (primary) hypertension: Secondary | ICD-10-CM

## 2022-11-01 ENCOUNTER — Telehealth: Payer: Self-pay | Admitting: Internal Medicine

## 2022-11-01 ENCOUNTER — Other Ambulatory Visit: Payer: Self-pay | Admitting: Internal Medicine

## 2022-11-01 NOTE — Telephone Encounter (Signed)
Medication Refill - Medication: clopidogrel (PLAVIX) 75 MG tablet   Has the patient contacted their pharmacy? Yes.     Preferred Pharmacy (with phone number or street name):  [id number] - [place], [place] - [address] Phone: [phone]  Fax: [phone]      Has the patient been seen for an appointment in the last year OR does the patient have an upcoming appointment? Yes.    The patient has not had this medicine for over a week and was told the pharmacy sent a request last week. Please assist patient further

## 2022-11-01 NOTE — Telephone Encounter (Signed)
Already refilled today in a separate encounter.

## 2022-11-13 ENCOUNTER — Other Ambulatory Visit: Payer: Self-pay | Admitting: Internal Medicine

## 2022-11-13 DIAGNOSIS — E118 Type 2 diabetes mellitus with unspecified complications: Secondary | ICD-10-CM

## 2022-12-10 ENCOUNTER — Encounter: Payer: Self-pay | Admitting: Internal Medicine

## 2022-12-10 ENCOUNTER — Encounter: Payer: 59 | Admitting: Internal Medicine

## 2022-12-10 ENCOUNTER — Ambulatory Visit (INDEPENDENT_AMBULATORY_CARE_PROVIDER_SITE_OTHER): Payer: 59 | Admitting: Internal Medicine

## 2022-12-10 VITALS — BP 122/70 | HR 76 | Ht 71.0 in | Wt 243.2 lb

## 2022-12-10 DIAGNOSIS — E1169 Type 2 diabetes mellitus with other specified complication: Secondary | ICD-10-CM

## 2022-12-10 DIAGNOSIS — Z23 Encounter for immunization: Secondary | ICD-10-CM

## 2022-12-10 DIAGNOSIS — Z1211 Encounter for screening for malignant neoplasm of colon: Secondary | ICD-10-CM

## 2022-12-10 DIAGNOSIS — R972 Elevated prostate specific antigen [PSA]: Secondary | ICD-10-CM | POA: Diagnosis not present

## 2022-12-10 DIAGNOSIS — I10 Essential (primary) hypertension: Secondary | ICD-10-CM | POA: Diagnosis not present

## 2022-12-10 DIAGNOSIS — R252 Cramp and spasm: Secondary | ICD-10-CM | POA: Diagnosis not present

## 2022-12-10 DIAGNOSIS — Z7984 Long term (current) use of oral hypoglycemic drugs: Secondary | ICD-10-CM | POA: Diagnosis not present

## 2022-12-10 DIAGNOSIS — E785 Hyperlipidemia, unspecified: Secondary | ICD-10-CM | POA: Diagnosis not present

## 2022-12-10 DIAGNOSIS — L03032 Cellulitis of left toe: Secondary | ICD-10-CM

## 2022-12-10 DIAGNOSIS — E118 Type 2 diabetes mellitus with unspecified complications: Secondary | ICD-10-CM

## 2022-12-10 DIAGNOSIS — Z0000 Encounter for general adult medical examination without abnormal findings: Secondary | ICD-10-CM | POA: Diagnosis not present

## 2022-12-10 DIAGNOSIS — I25709 Atherosclerosis of coronary artery bypass graft(s), unspecified, with unspecified angina pectoris: Secondary | ICD-10-CM | POA: Diagnosis not present

## 2022-12-10 MED ORDER — SILDENAFIL CITRATE 100 MG PO TABS: 100.0000 mg | ORAL_TABLET | ORAL | 0 refills | Status: DC | PRN

## 2022-12-10 MED ORDER — AMOXICILLIN-POT CLAVULANATE 875-125 MG PO TABS
1.0000 | ORAL_TABLET | Freq: Two times a day (BID) | ORAL | 0 refills | Status: AC
Start: 2022-12-10 — End: 2022-12-17

## 2022-12-10 NOTE — Patient Instructions (Signed)
Call Dr. Carmel Sacramento office about the colonoscopy.

## 2022-12-10 NOTE — Progress Notes (Deleted)
Date:  12/10/2022   Name:  Wyatt Mata   DOB:  [birth year]   MRN:  [id number]   Chief Complaint: No chief complaint on file. Wyatt Mata is a 67 y.o. male who presents today for his Complete Annual Exam. He feels {DESC; WELL/FAIRLY WELL/POORLY:18703}. He reports exercising ***. He reports he is sleeping {DESC; WELL/FAIRLY WELL/POORLY:18703}.   Colonoscopy: 08/2017 repeat 5 yrs Dr. Barbie Banner  Immunization History  Administered Date(s) Administered   Fluad Quad(high Dose 65+) 12/02/2020, 12/07/2021   Influenza,inj,Quad PF,6+ Mos 01/10/2015, 01/13/2018, 12/08/2018, 01/04/2020   Influenza-Unspecified 01/03/2017   Moderna Sars-Covid-2 Vaccination 05/21/2019, 06/21/2019   PNEUMOCOCCAL CONJUGATE-20 01/02/2021   Pneumococcal Polysaccharide-23 07/30/2011   Td 09/19/2008, 06/19/2018   Zoster, Live 02/02/2013   Health Maintenance Due  Topic Date Due   Zoster Vaccines- Shingrix (1 of 2) 07/06/2005   DTaP/Tdap/Td (2 - Tdap) 09/20/2018   Colonoscopy  08/25/2022   INFLUENZA VACCINE  10/21/2022   COVID-19 Vaccine (3 - 2023-24 season) 11/21/2022   Diabetic kidney evaluation - eGFR measurement  12/08/2022   Diabetic kidney evaluation - Urine ACR  12/08/2022    Lab Results  Component Value Date   PSA1 2.0 04/16/2022   PSA1 4.2 (H) 12/07/2021   PSA1 1.9 12/02/2020   PSA 1.6 01/07/2014     Diabetes He presents for his follow-up diabetic visit. He has type 2 diabetes mellitus. His disease course has been stable. Pertinent negatives for hypoglycemia include no dizziness, headaches or nervousness/anxiousness. Pertinent negatives for diabetes include no chest pain and no fatigue. An ACE inhibitor/angiotensin II receptor blocker is being taken. Eye exam is current.  Hypertension This is a chronic problem. The problem is controlled. Pertinent negatives include no chest pain, headaches, palpitations or shortness of breath. Past treatments include ACE inhibitors, calcium channel blockers and  beta blockers.  Hyperlipidemia This is a chronic problem. The problem is controlled. Pertinent negatives include no chest pain, myalgias or shortness of breath. Treatments tried: Repatha. The current treatment provides significant improvement of lipids.    Lab Results  Component Value Date   NA 140 12/07/2021   K 5.0 12/07/2021   CO2 23 12/07/2021   GLUCOSE 132 (H) 12/07/2021   BUN 16 12/07/2021   CREATININE 0.96 12/07/2021   CALCIUM 9.6 12/07/2021   EGFR 87 12/07/2021   GFRNONAA 75 06/20/2020   Lab Results  Component Value Date   CHOL 57 (L) 12/07/2021   HDL 33 (L) 12/07/2021   LDLCALC 6 12/07/2021   TRIG 91 12/07/2021   CHOLHDL 1.7 12/07/2021   Lab Results  Component Value Date   TSH 1.150 10/13/2018   Lab Results  Component Value Date   HGBA1C 6.7 (H) 08/26/2022   Lab Results  Component Value Date   WBC 5.6 12/07/2021   HGB 17.9 (H) 12/07/2021   HCT 48.1 12/07/2021   MCV 88 12/07/2021   PLT 173 12/07/2021   Lab Results  Component Value Date   ALT 28 12/07/2021   AST 19 12/07/2021   ALKPHOS 50 12/07/2021   BILITOT 0.4 12/07/2021   No results found for: "25OHVITD2", "25OHVITD3", "VD25OH"   Review of Systems  Constitutional:  Negative for appetite change, chills, diaphoresis, fatigue and unexpected weight change.  HENT:  Negative for hearing loss, tinnitus, trouble swallowing and voice change.   Eyes:  Negative for visual disturbance.  Respiratory:  Negative for choking, shortness of breath and wheezing.   Cardiovascular:  Negative for chest pain, palpitations and leg swelling.  Gastrointestinal:  Negative for abdominal pain, blood in stool, constipation and diarrhea.  Genitourinary:  Negative for difficulty urinating, dysuria and frequency.  Musculoskeletal:  Negative for arthralgias, back pain and myalgias.  Skin:  Negative for color change and rash.  Neurological:  Negative for dizziness, syncope and headaches.  Hematological:  Negative for adenopathy.   Psychiatric/Behavioral:  Negative for dysphoric mood and sleep disturbance. The patient is not nervous/anxious.     Patient Active Problem List   Diagnosis Date Noted   Osteoarthritis of left knee 09/10/2021   BMI 36.0-36.9,adult 01/02/2021   Neck pain 02/29/2020   Coronary artery disease involving coronary bypass graft of native heart with angina pectoris (HCC) 10/13/2018   AAA (abdominal aortic aneurysm) (HCC) 02/12/2018   Spondylolisthesis of lumbar region 11/30/2017   Lumbar radiculopathy 11/11/2017   DDD (degenerative disc disease), lumbar 10/10/2017   Type II diabetes mellitus with complication (HCC) 10/10/2015   Low serum testosterone 10/10/2015   Hematuria, gross 10/15/2014   Allergic rhinitis 08/05/2014   Hyperlipidemia associated with type 2 diabetes mellitus (HCC) 08/05/2014   Epididymal pain 08/05/2014   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   Essential (primary) hypertension 08/05/2014   Acid reflux 08/05/2014   History of colon polyps 08/05/2014   Obstructive apnea 08/05/2014   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    Allergies  Allergen Reactions   Cyclobenzaprine Other (See Comments)    Abdominal pain    Past Surgical History:  Procedure Laterality Date   COLONOSCOPY  08/16/2014   tubular adenoma   COLONOSCOPY  08/24/2017   one polyp - repeat 5 yrs   CORONARY ANGIOPLASTY N/A 07/2020   CORONARY ANGIOPLASTY WITH STENT PLACEMENT  2013   CORONARY ANGIOPLASTY WITH STENT PLACEMENT  01/2015   2 stents  - post circ and OM2   CORONARY ARTERY BYPASS GRAFT  2004    Social History   Tobacco Use   Smoking status: Former   Smokeless tobacco: Never  Vaping Use   Vaping status: Never Used  Substance Use Topics   Alcohol use: No    Alcohol/week: 0.0 standard drinks of alcohol   Drug use: No     Medication list has been reviewed and updated.  No outpatient medications have been marked as taking for  the 12/10/22 encounter (Appointment) with Reubin Milan, MD.       08/26/2022    8:37 AM 04/16/2022    8:37 AM 12/07/2021    8:05 AM 07/24/2021    8:43 AM  GAD 7 : Generalized Anxiety Score  Nervous, Anxious, on Edge 0 0 0 0  Control/stop worrying 0 0 0 0  Worry too much - different things 0 0 0 0  Trouble relaxing 0 0 0 0  Restless 0 0 0 0  Easily annoyed or irritable 0 0 0 0  Afraid - awful might happen 0 0 0 0  Total GAD 7 Score 0 0 0 0  Anxiety Difficulty Not difficult at all Not difficult at all Not difficult at all        08/26/2022    8:37 AM 04/16/2022    8:31 AM 12/07/2021    8:05 AM  Depression screen PHQ 2/9  Decreased Interest 0 0 0  Down, Depressed, Hopeless 0 0 0  PHQ - 2 Score 0 0 0  Altered sleeping 0 0 0  Tired, decreased energy 1 0 0  Change in appetite 0 0 0  Feeling bad  or failure about yourself  0 0 0  Trouble concentrating 0 0 0  Moving slowly or fidgety/restless 0 0 0  Suicidal thoughts 0 0 0  PHQ-9 Score 1 0 0  Difficult doing work/chores Not difficult at all Not difficult at all Not difficult at all    BP Readings from Last 3 Encounters:  08/26/22 124/68  04/16/22 122/64  12/07/21 124/60    Physical Exam Vitals and nursing note reviewed.  Constitutional:      Appearance: Normal appearance. He is well-developed.  HENT:     Head: Normocephalic.     Right Ear: Tympanic membrane, ear canal and external ear normal.     Left Ear: Tympanic membrane, ear canal and external ear normal.     Nose: Nose normal.  Eyes:     Conjunctiva/sclera: Conjunctivae normal.     Pupils: Pupils are equal, round, and reactive to light.  Neck:     Thyroid: No thyromegaly.     Vascular: No carotid bruit.  Cardiovascular:     Rate and Rhythm: Normal rate and regular rhythm.     Heart sounds: Normal heart sounds.  Pulmonary:     Effort: Pulmonary effort is normal.     Breath sounds: Normal breath sounds. No wheezing.  Chest:  Breasts:    Right: No mass.      Left: No mass.  Abdominal:     General: Bowel sounds are normal.     Palpations: Abdomen is soft.     Tenderness: There is no abdominal tenderness.  Musculoskeletal:        General: Normal range of motion.     Cervical back: Normal range of motion and neck supple.  Lymphadenopathy:     Cervical: No cervical adenopathy.  Skin:    General: Skin is warm and dry.  Neurological:     Mental Status: He is alert and oriented to person, place, and time.     Deep Tendon Reflexes: Reflexes are normal and symmetric.  Psychiatric:        Attention and Perception: Attention normal.        Mood and Affect: Mood normal.        Thought Content: Thought content normal.     Wt Readings from Last 3 Encounters:  08/26/22 248 lb (112.5 kg)  04/16/22 258 lb (117 kg)  12/07/21 256 lb 6.4 oz (116.3 kg)    There were no vitals taken for this visit.  Assessment and Plan:  Problem List Items Addressed This Visit   None   No follow-ups on file.    Reubin Milan, MD [facility] [place]

## 2022-12-10 NOTE — Assessment & Plan Note (Signed)
LDL is  Lab Results  Component Value Date   LDLCALC 6 12/07/2021   Currently being treated with Repatha with good compliance and no concerns.

## 2022-12-10 NOTE — Assessment & Plan Note (Deleted)
Blood sugars stable without hypoglycemic symptoms or events. Current regimen is Jardiance, Ozempic and metformin. Changes made last visit are none. Lab Results  Component Value Date   HGBA1C 6.7 (H) 08/26/2022

## 2022-12-10 NOTE — Assessment & Plan Note (Deleted)
LDL is  Lab Results  Component Value Date   LDLCALC 6 12/07/2021  Currently being treated with Repatha with good compliance and no concerns.

## 2022-12-10 NOTE — Assessment & Plan Note (Deleted)
Normal exam with stable BP on amlodipine, lisinopril and metoprolol. No concerns or side effects to current medication. No change in regimen; continue low sodium diet.

## 2022-12-10 NOTE — Assessment & Plan Note (Signed)
Blood sugars stable without hypoglycemic symptoms or events. Current regimen is metformin, Jardiance, Ozempic. Changes made last visit are none. Lab Results  Component Value Date   HGBA1C 6.7 (H) 08/26/2022

## 2022-12-10 NOTE — Assessment & Plan Note (Deleted)
Followed by Cardiology

## 2022-12-10 NOTE — Assessment & Plan Note (Signed)
Followed by Cardiology

## 2022-12-10 NOTE — Assessment & Plan Note (Signed)
Normal exam with stable BP. No concerns or side effects to current medication. No change in regimen; continue low sodium diet.

## 2022-12-10 NOTE — Progress Notes (Signed)
Date:  12/10/2022   Name:  Wyatt Mata   DOB:  [birth year]   MRN:  [id number]   Chief Complaint: Annual Exam Wyatt Mata is a 67 y.o. male who presents today for his Complete Annual Exam. He feels well. He reports exercising - none. He reports he is sleeping well.   Colonoscopy: due - Dr. Barbie Banner  Immunization History  Administered Date(s) Administered   Fluad Quad(high Dose 65+) 12/02/2020, 12/07/2021   Influenza,inj,Quad PF,6+ Mos 01/10/2015, 01/13/2018, 12/08/2018, 01/04/2020   Influenza-Unspecified 01/03/2017   Moderna Sars-Covid-2 Vaccination 05/21/2019, 06/21/2019   PNEUMOCOCCAL CONJUGATE-20 01/02/2021   Pneumococcal Polysaccharide-23 07/30/2011   Td 09/19/2008, 06/19/2018   Zoster, Live 02/02/2013   Health Maintenance Due  Topic Date Due   Zoster Vaccines- Shingrix (1 of 2) 07/06/2005   DTaP/Tdap/Td (2 - Tdap) 09/20/2018   Colonoscopy  08/25/2022   COVID-19 Vaccine (3 - 2023-24 season) 11/21/2022   Diabetic kidney evaluation - eGFR measurement  12/08/2022   Diabetic kidney evaluation - Urine ACR  12/08/2022    Lab Results  Component Value Date   PSA1 2.0 04/16/2022   PSA1 4.2 (H) 12/07/2021   PSA1 1.9 12/02/2020   PSA 1.6 01/07/2014    Toe Pain  The incident occurred 2 days ago. The incident occurred at home. Injury mechanism: pulled back the nail. The pain is present in the left foot. The quality of the pain is described as burning. The pain is mild.  Hypertension This is a chronic problem. The problem is controlled. Pertinent negatives include no chest pain, headaches, palpitations or shortness of breath.  Diabetes He presents for his follow-up diabetic visit. He has type 2 diabetes mellitus. His disease course has been stable. Pertinent negatives for hypoglycemia include no dizziness, headaches or nervousness/anxiousness. Pertinent negatives for diabetes include no chest pain and no fatigue.  Hyperlipidemia This is a chronic problem. The problem is  controlled. Recent lipid tests were reviewed and are low. Pertinent negatives include no chest pain, myalgias or shortness of breath.    Lab Results  Component Value Date   NA 140 12/07/2021   K 5.0 12/07/2021   CO2 23 12/07/2021   GLUCOSE 132 (H) 12/07/2021   BUN 16 12/07/2021   CREATININE 0.96 12/07/2021   CALCIUM 9.6 12/07/2021   EGFR 87 12/07/2021   GFRNONAA 75 06/20/2020   Lab Results  Component Value Date   CHOL 57 (L) 12/07/2021   HDL 33 (L) 12/07/2021   LDLCALC 6 12/07/2021   TRIG 91 12/07/2021   CHOLHDL 1.7 12/07/2021   Lab Results  Component Value Date   TSH 1.150 10/13/2018   Lab Results  Component Value Date   HGBA1C 6.7 (H) 08/26/2022   Lab Results  Component Value Date   WBC 5.6 12/07/2021   HGB 17.9 (H) 12/07/2021   HCT 48.1 12/07/2021   MCV 88 12/07/2021   PLT 173 12/07/2021   Lab Results  Component Value Date   ALT 28 12/07/2021   AST 19 12/07/2021   ALKPHOS 50 12/07/2021   BILITOT 0.4 12/07/2021   No results found for: "25OHVITD2", "25OHVITD3", "VD25OH"   Review of Systems  Constitutional:  Negative for appetite change, chills, diaphoresis, fatigue and unexpected weight change.  HENT:  Negative for hearing loss, tinnitus, trouble swallowing and voice change.   Eyes:  Negative for visual disturbance.  Respiratory:  Negative for choking, shortness of breath and wheezing.   Cardiovascular:  Negative for chest pain, palpitations and leg swelling.  Gastrointestinal:  Negative for abdominal pain, blood in stool, constipation and diarrhea.  Genitourinary:  Negative for difficulty urinating, dysuria and frequency.  Musculoskeletal:  Negative for arthralgias, back pain and myalgias.  Skin:  Negative for color change and rash.  Neurological:  Negative for dizziness, syncope and headaches.  Hematological:  Negative for adenopathy.  Psychiatric/Behavioral:  Negative for dysphoric mood and sleep disturbance. The patient is not nervous/anxious.      Patient Active Problem List   Diagnosis Date Noted   Osteoarthritis of left knee 09/10/2021   BMI 36.0-36.9,adult 01/02/2021   Neck pain 02/29/2020   Coronary artery disease involving coronary bypass graft of native heart with angina pectoris (HCC) 10/13/2018   AAA (abdominal aortic aneurysm) (HCC) 02/12/2018   Spondylolisthesis of lumbar region 11/30/2017   Lumbar radiculopathy 11/11/2017   DDD (degenerative disc disease), lumbar 10/10/2017   Type II diabetes mellitus with complication (HCC) 10/10/2015   Low serum testosterone 10/10/2015   Hematuria, gross 10/15/2014   Allergic rhinitis 08/05/2014   Hyperlipidemia associated with type 2 diabetes mellitus (HCC) 08/05/2014   Epididymal pain 08/05/2014   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   Essential (primary) hypertension 08/05/2014   Acid reflux 08/05/2014   History of colon polyps 08/05/2014   Obstructive apnea 08/05/2014   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    Allergies  Allergen Reactions   Cyclobenzaprine Other (See Comments)    Abdominal pain    Past Surgical History:  Procedure Laterality Date   COLONOSCOPY  08/16/2014   tubular adenoma   COLONOSCOPY  08/24/2017   one polyp - repeat 5 yrs   CORONARY ANGIOPLASTY N/A 07/2020   CORONARY ANGIOPLASTY WITH STENT PLACEMENT  2013   CORONARY ANGIOPLASTY WITH STENT PLACEMENT  01/2015   2 stents  - post circ and OM2   CORONARY ARTERY BYPASS GRAFT  2004    Social History   Tobacco Use   Smoking status: Former   Smokeless tobacco: Never  Vaping Use   Vaping status: Never Used  Substance Use Topics   Alcohol use: No    Alcohol/week: 0.0 standard drinks of alcohol   Drug use: No     Medication list has been reviewed and updated.  Current Meds  Medication Sig   albuterol (VENTOLIN HFA) 108 (90 Base) MCG/ACT inhaler TAKE 2 PUFFS BY MOUTH EVERY 6 HOURS AS NEEDED FOR WHEEZE OR SHORTNESS OF BREATH    amLODipine (NORVASC) 5 MG tablet Take 1 tablet (5 mg total) by mouth daily.   amoxicillin-clavulanate (AUGMENTIN) 875-125 MG tablet Take 1 tablet by mouth 2 (two) times daily for 7 days.   Aspirin-Acetaminophen-Caffeine 500-325-65 MG PACK Take 1 packet by mouth 2 (two) times daily. Goodys   atorvastatin (LIPITOR) 40 MG tablet Take 1 tablet (40 mg total) by mouth daily.   Choline Fenofibrate (FENOFIBRIC ACID) 135 MG CPDR TAKE 1 CAPSULE BY MOUTH EVERY DAY   clopidogrel (PLAVIX) 75 MG tablet TAKE 1 TABLET BY MOUTH EVERY DAY   gabapentin (NEURONTIN) 300 MG capsule TAKE 1 CAPSULE BY MOUTH FOUR TIMES A DAY   isosorbide mononitrate (IMDUR) 30 MG 24 hr tablet Take 30 mg by mouth daily.   JARDIANCE 25 MG TABS tablet TAKE 1 TABLET (25 MG TOTAL) BY MOUTH DAILY.   lisinopril (ZESTRIL) 10 MG tablet Take 1 tablet (10 mg total) by mouth daily.   metFORMIN (GLUCOPHAGE-XR) 500 MG 24 hr tablet TAKE 1 TABLET BY MOUTH EVERY DAY WITH BREAKFAST  metoprolol succinate (TOPROL-XL) 100 MG 24 hr tablet TAKE 1 TABLET BY MOUTH EVERY DAY WITH OR IMMEDIATELY FOLLOWING A MEAL   REPATHA SURECLICK 140 MG/ML SOAJ Inject into the skin every 14 (fourteen) days.   Semaglutide, 2 MG/DOSE, (OZEMPIC, 2 MG/DOSE,) 8 MG/3ML SOPN INJECT 2 MG AS DIRECTED ONCE A WEEK.   [DISCONTINUED] sildenafil (VIAGRA) 100 MG tablet TAKE 1 TABLET BY MOUTH AS NEEDED FOR ERECTILE DYSFUNCTION, AS DIRECTED.       12/10/2022   10:23 AM 08/26/2022    8:37 AM 04/16/2022    8:37 AM 12/07/2021    8:05 AM  GAD 7 : Generalized Anxiety Score  Nervous, Anxious, on Edge 0 0 0 0  Control/stop worrying 0 0 0 0  Worry too much - different things 0 0 0 0  Trouble relaxing 0 0 0 0  Restless 0 0 0 0  Easily annoyed or irritable 0 0 0 0  Afraid - awful might happen 0 0 0 0  Total GAD 7 Score 0 0 0 0  Anxiety Difficulty Not difficult at all Not difficult at all Not difficult at all Not difficult at all       12/10/2022   10:23 AM 08/26/2022    8:37 AM 04/16/2022     8:31 AM  Depression screen PHQ 2/9  Decreased Interest 0 0 0  Down, Depressed, Hopeless 0 0 0  PHQ - 2 Score 0 0 0  Altered sleeping 0 0 0  Tired, decreased energy 0 1 0  Change in appetite 0 0 0  Feeling bad or failure about yourself  0 0 0  Trouble concentrating 0 0 0  Moving slowly or fidgety/restless 0 0 0  Suicidal thoughts 0 0 0  PHQ-9 Score 0 1 0  Difficult doing work/chores Not difficult at all Not difficult at all Not difficult at all    BP Readings from Last 3 Encounters:  12/10/22 122/70  08/26/22 124/68  04/16/22 122/64    Physical Exam Vitals and nursing note reviewed.  Constitutional:      Appearance: Normal appearance. He is well-developed.  HENT:     Head: Normocephalic.     Right Ear: Tympanic membrane, ear canal and external ear normal.     Left Ear: Tympanic membrane, ear canal and external ear normal.     Nose: Nose normal.  Eyes:     Conjunctiva/sclera: Conjunctivae normal.     Pupils: Pupils are equal, round, and reactive to light.  Neck:     Thyroid: No thyromegaly.     Vascular: No carotid bruit.  Cardiovascular:     Rate and Rhythm: Normal rate and regular rhythm.     Heart sounds: Normal heart sounds.  Pulmonary:     Effort: Pulmonary effort is normal.     Breath sounds: Normal breath sounds. No wheezing.  Chest:  Breasts:    Right: No mass.     Left: No mass.  Abdominal:     General: Bowel sounds are normal.     Palpations: Abdomen is soft.     Tenderness: There is no abdominal tenderness.  Musculoskeletal:        General: Normal range of motion.     Cervical back: Normal range of motion and neck supple.  Lymphadenopathy:     Cervical: No cervical adenopathy.  Skin:    General: Skin is warm and dry.  Neurological:     Mental Status: He is alert and oriented to person, place, and  time.     Deep Tendon Reflexes: Reflexes are normal and symmetric.  Psychiatric:        Attention and Perception: Attention normal.        Mood and  Affect: Mood normal.        Thought Content: Thought content normal.    Diabetic Foot Exam - Simple   Simple Foot Form Diabetic Foot exam was performed with the following findings: Yes 12/10/2022 10:42 AM  Visual Inspection No deformities, no ulcerations, no other skin breakdown bilaterally: Yes See comments: Yes Sensation Testing Intact to touch and monofilament testing bilaterally: Yes Pulse Check Posterior Tibialis and Dorsalis pulse intact bilaterally: Yes Comments Medial left great toe tender and red; no bleeding or drainage or purulence      Wt Readings from Last 3 Encounters:  12/10/22 243 lb 3.2 oz (110.3 kg)  08/26/22 248 lb (112.5 kg)  04/16/22 258 lb (117 kg)    BP 122/70   Pulse 76   Ht 5\' 11"  (1.803 m)   Wt 243 lb 3.2 oz (110.3 kg)   SpO2 97%   BMI 33.92 kg/m  Waist circ 44 inches  Assessment and Plan:  Problem List Items Addressed This Visit       Unprioritized   Type II diabetes mellitus with complication (HCC) (Chronic)    Blood sugars stable without hypoglycemic symptoms or events. Current regimen is metformin, Jardiance, Ozempic. Changes made last visit are none. Lab Results  Component Value Date   HGBA1C 6.7 (H) 08/26/2022         Relevant Orders   Hemoglobin A1c   Microalbumin / creatinine urine ratio   Hyperlipidemia associated with type 2 diabetes mellitus (HCC) (Chronic)    LDL is  Lab Results  Component Value Date   LDLCALC 6 12/07/2021   Currently being treated with Repatha with good compliance and no concerns.       Relevant Medications   sildenafil (VIAGRA) 100 MG tablet   Other Relevant Orders   Lipid panel   Essential (primary) hypertension (Chronic)    Normal exam with stable BP. No concerns or side effects to current medication. No change in regimen; continue low sodium diet.        Relevant Medications   sildenafil (VIAGRA) 100 MG tablet   Other Relevant Orders   CBC with Differential/Platelet    Comprehensive metabolic panel   TSH   Coronary artery disease involving coronary bypass graft of native heart with angina pectoris (HCC) (Chronic)    Followed by Cardiology.      Relevant Medications   sildenafil (VIAGRA) 100 MG tablet   Other Visit Diagnoses     Annual physical exam    -  Primary   form for work will be completed   Relevant Orders   CBC with Differential/Platelet   Comprehensive metabolic panel   Hemoglobin A1c   Lipid panel   Microalbumin / creatinine urine ratio   TSH   PSA   Colon cancer screening       due for colonoscopy but [facility] not have [facility] due to previous trauma associated with [facility] Tendler wants him done at a hospital.   Elevated PSA       Relevant Orders   PSA   Long term current use of oral hypoglycemic drug       Muscle cramps       recommend Mg++ daily   Relevant Orders   Magnesium   Paronychia of great toe  of left foot       Relevant Medications   amoxicillin-clavulanate (AUGMENTIN) 875-125 MG tablet   Need for influenza vaccination       Relevant Orders   Flu Vaccine Trivalent High Dose (Fluad)       Return in about 4 months (around 04/11/2023) for DM, HTN.    Reubin Milan, MD [facility] [place]

## 2022-12-11 LAB — CBC WITH DIFFERENTIAL/PLATELET
Basophils Absolute: 0 10*3/uL (ref 0.0–0.2)
Basos: 1 %
EOS (ABSOLUTE): 0.1 10*3/uL (ref 0.0–0.4)
Eos: 2 %
Hematocrit: 53.6 % — ABNORMAL HIGH (ref 37.5–51.0)
Hemoglobin: 17.9 g/dL — ABNORMAL HIGH (ref 13.0–17.7)
Immature Grans (Abs): 0 10*3/uL (ref 0.0–0.1)
Immature Granulocytes: 1 %
Lymphocytes Absolute: 1.6 10*3/uL (ref 0.7–3.1)
Lymphs: 26 %
MCH: 30 pg (ref 26.6–33.0)
MCHC: 33.4 g/dL (ref 31.5–35.7)
MCV: 90 fL (ref 79–97)
Monocytes Absolute: 0.7 10*3/uL (ref 0.1–0.9)
Monocytes: 11 %
Neutrophils Absolute: 3.7 10*3/uL (ref 1.4–7.0)
Neutrophils: 59 %
Platelets: 167 10*3/uL (ref 150–450)
RBC: 5.96 x10E6/uL — ABNORMAL HIGH (ref 4.14–5.80)
RDW: 13.8 % (ref 11.6–15.4)
WBC: 6.1 10*3/uL (ref 3.4–10.8)

## 2022-12-11 LAB — LIPID PANEL
Chol/HDL Ratio: 1.9 ratio (ref 0.0–5.0)
Cholesterol, Total: 73 mg/dL — ABNORMAL LOW (ref 100–199)
HDL: 38 mg/dL — ABNORMAL LOW (ref >39)
LDL Chol Calc (NIH): 20 mg/dL (ref 0–99)
Triglycerides: 67 mg/dL (ref 0–149)
VLDL Cholesterol Cal: 15 mg/dL (ref 5–40)

## 2022-12-11 LAB — COMPREHENSIVE METABOLIC PANEL
ALT: 38 IU/L (ref 0–44)
AST: 27 IU/L (ref 0–40)
Albumin: 4.6 g/dL (ref 3.9–4.9)
Alkaline Phosphatase: 49 IU/L (ref 44–121)
BUN/Creatinine Ratio: 21 (ref 10–24)
BUN: 23 mg/dL (ref 8–27)
Bilirubin Total: 0.7 mg/dL (ref 0.0–1.2)
CO2: 22 mmol/L (ref 20–29)
Calcium: 9.9 mg/dL (ref 8.6–10.2)
Chloride: 103 mmol/L (ref 96–106)
Creatinine, Ser: 1.09 mg/dL (ref 0.76–1.27)
Globulin, Total: 2.3 g/dL (ref 1.5–4.5)
Glucose: 96 mg/dL (ref 70–99)
Potassium: 5.3 mmol/L — ABNORMAL HIGH (ref 3.5–5.2)
Sodium: 141 mmol/L (ref 134–144)
Total Protein: 6.9 g/dL (ref 6.0–8.5)
eGFR: 74 mL/min/{1.73_m2} (ref >59)

## 2022-12-11 LAB — TSH: TSH: 1.35 u[IU]/mL (ref 0.450–4.500)

## 2022-12-11 LAB — MICROALBUMIN / CREATININE URINE RATIO
Creatinine, Urine: 119.6 mg/dL
Microalb/Creat Ratio: 5 mg/g creat (ref 0–29)
Microalbumin, Urine: 5.7 ug/mL

## 2022-12-11 LAB — PSA: Prostate Specific Ag, Serum: 3.3 ng/mL (ref 0.0–4.0)

## 2022-12-11 LAB — HEMOGLOBIN A1C
Est. average glucose Bld gHb Est-mCnc: 140 mg/dL
Hgb A1c MFr Bld: 6.5 % — ABNORMAL HIGH (ref 4.8–5.6)

## 2022-12-11 LAB — MAGNESIUM: Magnesium: 2.4 mg/dL — ABNORMAL HIGH (ref 1.6–2.3)

## 2022-12-13 ENCOUNTER — Telehealth: Payer: Self-pay | Admitting: *Deleted

## 2022-12-13 ENCOUNTER — Telehealth: Payer: Self-pay | Admitting: Internal Medicine

## 2022-12-13 NOTE — Telephone Encounter (Signed)
Copied from CRM [id number]. Topic: General - Other >> Dec 13, 2022 12:11 PM Santiya F wrote: Reason for CRM: Pt is calling in requesting to speak with Chassidy. Please follow up with pt.

## 2022-12-13 NOTE — Telephone Encounter (Signed)
Patient informed.  Wyatt Mata

## 2022-12-13 NOTE — Telephone Encounter (Signed)
Pt asking questions regarding  lab results per notes of Dr. Judithann Graves from 12/13/22 on 12/13/22. Pt verbalized understanding of previously reviewed labs but requesting if testosterone levels checked. No labs noted for checking testosterone , only PSA. Patient requesting if he can also have testosterone levels checked. Please advise .

## 2022-12-13 NOTE — Telephone Encounter (Signed)
See other telephone counter for 12/13/2022.  - Chase Arnall

## 2022-12-27 ENCOUNTER — Other Ambulatory Visit: Payer: Self-pay | Admitting: Internal Medicine

## 2022-12-27 DIAGNOSIS — E118 Type 2 diabetes mellitus with unspecified complications: Secondary | ICD-10-CM

## 2023-02-25 DIAGNOSIS — E119 Type 2 diabetes mellitus without complications: Secondary | ICD-10-CM | POA: Diagnosis not present

## 2023-02-25 DIAGNOSIS — H2513 Age-related nuclear cataract, bilateral: Secondary | ICD-10-CM | POA: Diagnosis not present

## 2023-03-15 LAB — HM DIABETES EYE EXAM

## 2023-03-29 ENCOUNTER — Telehealth: Payer: Self-pay | Admitting: Internal Medicine

## 2023-03-29 NOTE — Telephone Encounter (Signed)
 Referral Request - Did the patient discuss referral with their provider in the last year? Yes (If No - schedule appointment) (If Yes - send message)  Appointment offered? Yes  Type of order/referral and detailed reason for visit: Colonoscopy (prior one has expired and needs a new one sent over to sch appt)  Preference of office, provider, location: located on [address]  If referral order, have you been seen by this specialty before? Yes Dr Alm Spry  Can we respond through MyChart? No

## 2023-03-30 ENCOUNTER — Other Ambulatory Visit: Payer: Self-pay | Admitting: Internal Medicine

## 2023-03-30 DIAGNOSIS — Z1211 Encounter for screening for malignant neoplasm of colon: Secondary | ICD-10-CM

## 2023-03-30 NOTE — Telephone Encounter (Signed)
 Referral placed.

## 2023-04-06 ENCOUNTER — Telehealth: Payer: Self-pay | Admitting: Internal Medicine

## 2023-04-06 NOTE — Telephone Encounter (Signed)
 Printed and sent. Mychart message sent it pt.  KP

## 2023-04-06 NOTE — Telephone Encounter (Signed)
 Copied from CRM [id number]. Topic: General - Inquiry >> Apr 06, 2023 11:13 AM Wyatt Mata T wrote: Reason for CRM: patient called stated he needs his vaccination records emailed to [email or web address] for the yearly discount. Please f/u with patient

## 2023-04-07 ENCOUNTER — Ambulatory Visit: Payer: Self-pay

## 2023-04-07 ENCOUNTER — Telehealth: Payer: Self-pay | Admitting: Internal Medicine

## 2023-04-07 NOTE — Telephone Encounter (Signed)
Copied from CRM [id number]. Topic: General - Other >> Apr 07, 2023  2:49 PM Marlow Baars wrote: Reason for CRM: Patient called back again asking to speak with the provider and then he hung up

## 2023-04-07 NOTE — Telephone Encounter (Signed)
Summary: balance issues   Pt called and wanted to speak w/ a nurse.  I made him appt w/ Dr Asencion Partridge 3 pm tomorrow at his request. He is having balance issue and says he is haing nausea too.  After several minutes, pt hung  up.     Called pt - left message on machine to return our call.

## 2023-04-07 NOTE — Telephone Encounter (Signed)
Spoke to pt.   Wyatt Mata

## 2023-04-07 NOTE — Telephone Encounter (Signed)
Noted  Pt has an appt. Tomorrow.  KP

## 2023-04-07 NOTE — Telephone Encounter (Signed)
Summary: balance issues   Pt called and wanted to speak w/ a nurse.  I made him appt w/ Dr Asencion Partridge 3 pm tomorrow at his request. He is having balance issue and says he is haing nausea too.  After several minutes, pt hung  up.     Called pt - lmom to return our call.

## 2023-04-07 NOTE — Telephone Encounter (Signed)
  Chief Complaint: Loss of balance Symptoms: dizziness Frequency: a few months Pertinent Negatives: Patient denies  Disposition: [] ED /[] Urgent Care (no appt availability in office) / [x] Appointment(In office/virtual)/ []  [facility]/ [] Home Care/ [] Refused Recommended Disposition /[] [place] [facility]/ []  Follow-up with PCP Additional Notes: Called pt back to triage. Pt assure me that he is fine. He has some loss of balance that seems to be getting worse. He has an appt for tomorrow with Dr. Judithann Graves and he will be fine until then.    Summary: balance issues   Pt called and wanted to speak w/ a nurse.  I made him appt w/ Dr Asencion Partridge 3 pm tomorrow at his request. He is having balance issue and says he is haing nausea too.  After several minutes, pt hung  up.        Reason for Disposition  [1] MODERATE dizziness (e.g., interferes with normal activities) AND [2] has been evaluated by doctor (or NP/PA) for this  Answer Assessment - Initial Assessment Questions 1. DESCRIPTION: "Describe your dizziness."     Loss of balance 5. ONSET:  "When did the dizziness begin?"     Several months ago 10. OTHER SYMPTOMS: "Do you have any other symptoms?" (e.g., fever, chest pain, vomiting, diarrhea, bleeding)       nausea  Protocols used: Dizziness - Lightheadedness-A-AH

## 2023-04-07 NOTE — Telephone Encounter (Signed)
Spoke to pt he has received his immunizations to his email.  KP

## 2023-04-07 NOTE — Telephone Encounter (Signed)
Pt is calling in requesting to speak with Mariann Barter. Please follow up with pt.

## 2023-04-08 ENCOUNTER — Other Ambulatory Visit: Payer: Self-pay | Admitting: Internal Medicine

## 2023-04-08 ENCOUNTER — Encounter: Payer: Self-pay | Admitting: Internal Medicine

## 2023-04-08 ENCOUNTER — Ambulatory Visit: Payer: 59 | Admitting: Internal Medicine

## 2023-04-08 VITALS — BP 126/72 | HR 76 | Ht 71.0 in | Wt 255.0 lb

## 2023-04-08 DIAGNOSIS — R269 Unspecified abnormalities of gait and mobility: Secondary | ICD-10-CM

## 2023-04-08 DIAGNOSIS — I10 Essential (primary) hypertension: Secondary | ICD-10-CM

## 2023-04-08 DIAGNOSIS — H9313 Tinnitus, bilateral: Secondary | ICD-10-CM

## 2023-04-08 DIAGNOSIS — G8929 Other chronic pain: Secondary | ICD-10-CM

## 2023-04-08 DIAGNOSIS — E118 Type 2 diabetes mellitus with unspecified complications: Secondary | ICD-10-CM

## 2023-04-08 DIAGNOSIS — M79671 Pain in right foot: Secondary | ICD-10-CM

## 2023-04-08 DIAGNOSIS — R42 Dizziness and giddiness: Secondary | ICD-10-CM | POA: Diagnosis not present

## 2023-04-08 DIAGNOSIS — I739 Peripheral vascular disease, unspecified: Secondary | ICD-10-CM | POA: Diagnosis not present

## 2023-04-08 MED ORDER — GABAPENTIN 300 MG PO CAPS: ORAL_CAPSULE | ORAL | 3 refills | Status: AC

## 2023-04-08 NOTE — Progress Notes (Signed)
Date:  04/08/2023   Name:  Wyatt Mata   DOB:  Apr 01, 1955   MRN:  [id number]   Chief Complaint: Nausea (Patient said he has been losing his balance, and having nausea on and off x 6 months. Getting worse. Nauseous mostly in the mornings. Patient has vomited twice. No dizziness. )  HPI Balance concerns - over the past 6 months finds himself listing to the right when walking.  It is not constant but getting worse.  No falls.  He has long standing tinnitus which he says is unchanged.  He denies weakness, HA, focal neurological sx such as slurred speech, unilateral limb weakness, vision changes.  He does note occasional tremor of UEs.  His medications are unchanged.  He was off of Plavix for several weeks earlier this year but the symptoms started prior.  No hx of brain imaging. He has recent VS consult and is scheduled for Carotid US and CT angiogram of both LEs.  Review of Systems  Constitutional:  Negative for chills, fatigue and fever.  HENT:  Positive for tinnitus. Negative for hearing loss and trouble swallowing.   Respiratory:  Negative for chest tightness and shortness of breath.   Cardiovascular:  Negative for chest pain and palpitations.       Claudication  Musculoskeletal:  Positive for gait problem.  Neurological:  Positive for weakness (right gait tendency). Negative for dizziness, tremors, speech difficulty, light-headedness, numbness and headaches.  Psychiatric/Behavioral:  Negative for dysphoric mood and sleep disturbance. The patient is not nervous/anxious.      Lab Results  Component Value Date   NA 141 12/10/2022   K 5.3 (H) 12/10/2022   CO2 22 12/10/2022   GLUCOSE 96 12/10/2022   BUN 23 12/10/2022   CREATININE 1.09 12/10/2022   CALCIUM 9.9 12/10/2022   EGFR 74 12/10/2022   GFRNONAA 75 06/20/2020   Lab Results  Component Value Date   CHOL 73 (L) 12/10/2022   HDL 38 (L) 12/10/2022   LDLCALC 20 12/10/2022   TRIG 67 12/10/2022   CHOLHDL 1.9 12/10/2022    Lab Results  Component Value Date   TSH 1.350 12/10/2022   Lab Results  Component Value Date   HGBA1C 6.5 (H) 12/10/2022   Lab Results  Component Value Date   WBC 6.1 12/10/2022   HGB 17.9 (H) 12/10/2022   HCT 53.6 (H) 12/10/2022   MCV 90 12/10/2022   PLT 167 12/10/2022   Lab Results  Component Value Date   ALT 38 12/10/2022   AST 27 12/10/2022   ALKPHOS 49 12/10/2022   BILITOT 0.7 12/10/2022   No results found for: "25OHVITD2", "25OHVITD3", "VD25OH"   Patient Active Problem List   Diagnosis Date Noted   Osteoarthritis of left knee 09/10/2021   BMI 36.0-36.9,adult 01/02/2021   Neck pain 02/29/2020   Coronary artery disease involving coronary bypass graft of native heart with angina pectoris (HCC) 10/13/2018   AAA (abdominal aortic aneurysm) (HCC) 02/12/2018   Spondylolisthesis of lumbar region 11/30/2017   Lumbar radiculopathy 11/11/2017   DDD (degenerative disc disease), lumbar 10/10/2017   Type II diabetes mellitus with complication (HCC) 10/10/2015   Low serum testosterone 10/10/2015   Hematuria, gross 10/15/2014   Allergic rhinitis 08/05/2014   Hyperlipidemia associated with type 2 diabetes mellitus (HCC) 08/05/2014   Epididymal pain 08/05/2014   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   Essential (primary) hypertension 08/05/2014   Acid reflux 08/05/2014   History of colon polyps 08/05/2014  Obstructive apnea 08/05/2014   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    Allergies  Allergen Reactions   Topiramate Other (See Comments)    Severe headache   Cyclobenzaprine Other (See Comments)    Abdominal pain    Past Surgical History:  Procedure Laterality Date   COLONOSCOPY  08/16/2014   tubular adenoma   COLONOSCOPY  08/24/2017   one polyp - repeat 5 yrs   CORONARY ANGIOPLASTY N/A 07/2020   CORONARY ANGIOPLASTY WITH STENT PLACEMENT  2013   CORONARY ANGIOPLASTY WITH STENT PLACEMENT  01/2015   2  stents  - post circ and OM2   CORONARY ARTERY BYPASS GRAFT  2004    Social History   Tobacco Use   Smoking status: Former   Smokeless tobacco: Never  Vaping Use   Vaping status: Never Used  Substance Use Topics   Alcohol use: No    Alcohol/week: 0.0 standard drinks of alcohol   Drug use: No     Medication list has been reviewed and updated.  Current Meds  Medication Sig   albuterol (VENTOLIN HFA) 108 (90 Base) MCG/ACT inhaler TAKE 2 PUFFS BY MOUTH EVERY 6 HOURS AS NEEDED FOR WHEEZE OR SHORTNESS OF BREATH   amLODipine (NORVASC) 5 MG tablet Take 1 tablet (5 mg total) by mouth daily.   Aspirin-Acetaminophen-Caffeine 500-325-65 MG PACK Take 1 packet by mouth 2 (two) times daily. Goodys   atorvastatin (LIPITOR) 40 MG tablet Take 1 tablet (40 mg total) by mouth daily.   Choline Fenofibrate (FENOFIBRIC ACID) 135 MG CPDR TAKE 1 CAPSULE BY MOUTH EVERY DAY   clopidogrel (PLAVIX) 75 MG tablet TAKE 1 TABLET BY MOUTH EVERY DAY   isosorbide mononitrate (IMDUR) 30 MG 24 hr tablet Take 30 mg by mouth daily.   JARDIANCE 25 MG TABS tablet TAKE 1 TABLET (25 MG TOTAL) BY MOUTH DAILY.   lisinopril (ZESTRIL) 10 MG tablet Take 1 tablet (10 mg total) by mouth daily.   metFORMIN (GLUCOPHAGE-XR) 500 MG 24 hr tablet TAKE 1 TABLET BY MOUTH EVERY DAY WITH BREAKFAST   metoprolol succinate (TOPROL-XL) 100 MG 24 hr tablet TAKE 1 TABLET BY MOUTH EVERY DAY WITH OR IMMEDIATELY FOLLOWING A MEAL   REPATHA SURECLICK 140 MG/ML SOAJ Inject into the skin every 14 (fourteen) days.   Semaglutide, 2 MG/DOSE, (OZEMPIC, 2 MG/DOSE,) 8 MG/3ML SOPN INJECT 2 MG AS DIRECTED ONCE A WEEK.   sildenafil (VIAGRA) 100 MG tablet Take 1 tablet (100 mg total) by mouth as needed for erectile dysfunction.   [DISCONTINUED] gabapentin (NEURONTIN) 300 MG capsule TAKE 1 CAPSULE BY MOUTH FOUR TIMES A DAY       04/08/2023    2:38 PM 12/10/2022   10:23 AM 08/26/2022    8:37 AM 04/16/2022    8:37 AM  GAD 7 : Generalized Anxiety Score  Nervous,  Anxious, on Edge 0 0 0 0  Control/stop worrying 0 0 0 0  Worry too much - different things 0 0 0 0  Trouble relaxing 0 0 0 0  Restless 0 0 0 0  Easily annoyed or irritable 0 0 0 0  Afraid - awful might happen 0 0 0 0  Total GAD 7 Score 0 0 0 0  Anxiety Difficulty Not difficult at all Not difficult at all Not difficult at all Not difficult at all       04/08/2023    2:38 PM 12/10/2022   10:23 AM 08/26/2022    8:37 AM  Depression screen  PHQ 2/9  Decreased Interest 0 0 0  Down, Depressed, Hopeless 0 0 0  PHQ - 2 Score 0 0 0  Altered sleeping 0 0 0  Tired, decreased energy 0 0 1  Change in appetite 0 0 0  Feeling bad or failure about yourself  0 0 0  Trouble concentrating 0 0 0  Moving slowly or fidgety/restless 0 0 0  Suicidal thoughts 0 0 0  PHQ-9 Score 0 0 1  Difficult doing work/chores Not difficult at all Not difficult at all Not difficult at all    BP Readings from Last 3 Encounters:  04/08/23 126/72  12/10/22 122/70  08/26/22 124/68    Physical Exam Constitutional:      Appearance: Normal appearance.  Neck:     Vascular: No carotid bruit.  Cardiovascular:     Rate and Rhythm: Normal rate and regular rhythm.  Pulmonary:     Effort: Pulmonary effort is normal.     Breath sounds: No wheezing or rhonchi.  Musculoskeletal:     Cervical back: Normal range of motion.  Lymphadenopathy:     Cervical: No cervical adenopathy.  Neurological:     General: No focal deficit present.     Mental Status: He is alert.     Cranial Nerves: Cranial nerves 2-12 are intact.     Sensory: Sensation is intact.     Motor: Motor function is intact.     Coordination: Coordination is intact.     Wt Readings from Last 3 Encounters:  04/08/23 255 lb (115.7 kg)  12/10/22 243 lb 3.2 oz (110.3 kg)  08/26/22 248 lb (112.5 kg)    BP 126/72   Pulse 76   Ht 5\' 11"  (1.803 m)   Wt 255 lb (115.7 kg)   SpO2 98%   BMI 35.57 kg/m   Assessment and Plan:  Problem List Items Addressed This  Visit   None Visit Diagnoses       Gait disturbance    -  Primary   unclear etiology - could be related to long standing tinnitus will get CT Brain to rule out sequelae of stroke, tumor, inner ear pathology   Relevant Orders   CT HEAD WO CONTRAST ( )     Tinnitus of both ears       may need to see ENT or Neurology if CT brain unrevealing   Relevant Orders   CT HEAD WO CONTRAST ( )     Chronic foot pain, right       Relevant Medications   gabapentin (NEURONTIN) 300 MG capsule       No follow-ups on file.    Reubin Milan, MD [facility] Primary [facility] [place]

## 2023-04-08 NOTE — Telephone Encounter (Signed)
Requested Prescriptions  Pending Prescriptions Disp Refills   OZEMPIC, 2 MG/DOSE, 8 MG/3ML SOPN [Pharmacy Med Name: OZEMPIC 8 MG/3 ML (2 MG/DOSE)]  3    Sig: INJECT 2 MG AS DIRECTED ONCE A WEEK.     Endocrinology:  Diabetes - GLP-1 Receptor Agonists - semaglutide Failed - 04/08/2023  5:08 PM      Failed - HBA1C in normal range and within 180 days    Hgb A1c MFr Bld  Date Value Ref Range Status  12/10/2022 6.5 (H) 4.8 - 5.6 % Final    Comment:             Prediabetes: 5.7 - 6.4          Diabetes: >6.4          Glycemic control for adults with diabetes: <7.0          Passed - Cr in normal range and within 360 days    Creatinine, Ser  Date Value Ref Range Status  12/10/2022 1.09 0.76 - 1.27 mg/dL Final         Passed - Valid encounter within last 6 months    Recent Outpatient Visits           Today Gait disturbance   [place] [facility] at [facility], Nyoka Cowden, MD   3 months ago Annual physical exam   [facility] Primary [facility] & [facility] at [facility], Nyoka Cowden, MD   7 months ago Type II diabetes mellitus with complication [facility])   [place] [facility] Care & Sports Medicine at [facility], Nyoka Cowden, MD   11 months ago Elevated PSA, less than 10 ng/ml   [facility] at [facility], Nyoka Cowden, MD   1 year ago Annual physical exam   [facility] at [facility], Nyoka Cowden, MD       Future Appointments             In 3 weeks Judithann Graves Nyoka Cowden, MD [facility] Primary Care & Sports Medicine at [facility], [facility]   In 8 months Reubin Milan, MD [facility] Primary Care & Sports [facility] at [facility] [place], [facility]             metoprolol succinate (TOPROL-XL) 100 MG 24 hr tablet [Pharmacy Med Name: METOPROLOL SUCC ER 100 MG TAB] 90 tablet 1    Sig: TAKE 1 TABLET BY MOUTH EVERY DAY WITH OR IMMEDIATELY FOLLOWING A MEAL      Cardiovascular:  Beta Blockers Passed - 04/08/2023  5:08 PM      Passed - Last BP in normal range    BP Readings from Last 1 Encounters:  04/08/23 126/72         Passed - Last Heart Rate in normal range    Pulse Readings from Last 1 Encounters:  04/08/23 76         Passed - Valid encounter within last 6 months    Recent Outpatient Visits           Today Gait disturbance   [place] [facility] Medicine at [facility], Nyoka Cowden, MD   3 months ago Annual physical exam   [facility] Primary Care & Sports [facility] at [facility], Nyoka Cowden, MD   7 months ago Type II diabetes mellitus with complication [facility])   [place] Primary Care &  Sports Medicine at [facility], Nyoka Cowden, MD   11 months ago Elevated PSA, less than 10 ng/ml   [facility] Primary Care & Sports Medicine at [facility], Nyoka Cowden, MD   1 year ago Annual physical exam   [facility] Primary Care & Sports [facility] at [facility], Nyoka Cowden, MD       Future Appointments             In 3 weeks Judithann Graves Nyoka Cowden, MD [facility] Primary Care & Sports Medicine at [facility], [facility]   In 8 months Judithann Graves, Nyoka Cowden, MD [facility] Health Primary Care & Sports Medicine at [facility] [place], [facility]             gabapentin (NEURONTIN) 300 MG capsule [Pharmacy Med Name: GABAPENTIN 300 MG CAPSULE] 360 capsule 3    Sig: TAKE 1 CAPSULE BY MOUTH FOUR TIMES A DAY     Neurology: Anticonvulsants - gabapentin Passed - 04/08/2023  5:08 PM      Passed - Cr in normal range and within 360 days    Creatinine, Ser  Date Value Ref Range Status  12/10/2022 1.09 0.76 - 1.27 mg/dL Final         Passed - Completed PHQ-2 or PHQ-9 in the last 360 days      Passed - Valid encounter within last 12 months    Recent Outpatient Visits           Today Gait disturbance   [place] [facility] Care & Sports Medicine at [facility], Nyoka Cowden, MD   3 months ago  Annual physical exam   [facility] Primary Care & Sports [facility] at [facility], Nyoka Cowden, MD   7 months ago Type II diabetes mellitus with complication [facility])   [place] Primary Care & Sports Medicine at [facility], Nyoka Cowden, MD   11 months ago Elevated PSA, less than 10 ng/ml   [facility] at [facility], Nyoka Cowden, MD   1 year ago Annual physical exam   [facility] Primary Care & Sports Medicine at [facility], Nyoka Cowden, MD       Future Appointments             In 3 weeks Judithann Graves Nyoka Cowden, MD [facility] Health Primary Care & Sports Medicine at [facility], [facility]   In 8 months Judithann Graves, Nyoka Cowden, MD [facility] Sports [facility] at [facility] [place], [facility]             amLODipine (NORVASC) 5 MG tablet [Pharmacy Med Name: AMLODIPINE BESYLATE 5 MG TAB] 90 tablet 1    Sig: TAKE 1 TABLET (5 MG TOTAL) BY MOUTH DAILY.     Cardiovascular: Calcium Channel Blockers 2 Passed - 04/08/2023  5:08 PM      Passed - Last BP in normal range    BP Readings from Last 1 Encounters:  04/08/23 126/72         Passed - Last Heart Rate in normal range    Pulse Readings from Last 1 Encounters:  04/08/23 76         Passed - Valid encounter within last 6 months    Recent Outpatient Visits           Today Gait disturbance   [facility] Primary Care & Sports Medicine at [facility], Nyoka Cowden, MD   3 months ago Annual  physical exam   [facility] Primary Care & Sports Medicine at [facility], Nyoka Cowden, MD   7 months ago Type II diabetes mellitus with complication [facility])   [place] Primary Care & Sports Medicine at [facility], Nyoka Cowden, MD   11 months ago Elevated PSA, less than 10 ng/ml   [facility] Sports Medicine at [facility], Nyoka Cowden, MD   1 year ago Annual physical exam   [facility] Primary Care & Sports Medicine at [facility], Nyoka Cowden, MD       Future Appointments             In 3 weeks Reubin Milan, MD [facility] Health Primary Care & Sports Medicine at [facility], [facility]   In 8 months Reubin Milan, MD [facility] Primary Care & Sports Medicine at [facility] [place], [facility]             lisinopril (ZESTRIL) 10 MG tablet [Pharmacy Med Name: LISINOPRIL 10 MG TABLET] 90 tablet 1    Sig: TAKE 1 TABLET BY MOUTH EVERY DAY     Cardiovascular:  ACE Inhibitors Failed - 04/08/2023  5:08 PM      Failed - K in normal range and within 180 days    Potassium  Date Value Ref Range Status  12/10/2022 5.3 (H) 3.5 - 5.2 mmol/L Final         Passed - Cr in normal range and within 180 days    Creatinine, Ser  Date Value Ref Range Status  12/10/2022 1.09 0.76 - 1.27 mg/dL Final         Passed - Patient is not pregnant      Passed - Last BP in normal range    BP Readings from Last 1 Encounters:  04/08/23 126/72         Passed - Valid encounter within last 6 months    Recent Outpatient Visits           Today Gait disturbance   [place] [facility] Sports [facility] at [facility], Nyoka Cowden, MD   3 months ago Annual physical exam   [facility] at [facility], Nyoka Cowden, MD   7 months ago Type II diabetes mellitus with complication [facility])   [place] Primary Care & Sports Medicine at [facility], Nyoka Cowden, MD   11 months ago Elevated PSA, less than 10 ng/ml   [facility] Primary Care & Sports Medicine at [facility], Nyoka Cowden, MD   1 year ago Annual physical exam   [facility] Primary [facility] at [facility], Nyoka Cowden, MD       Future Appointments             In 3 weeks Judithann Graves Nyoka Cowden, MD [facility] Primary Care & Sports Medicine at [facility], [facility]   In 8 months Judithann Graves, Nyoka Cowden, MD [facility] Health Primary Care & Sports Medicine at [facility], [facility]

## 2023-04-13 ENCOUNTER — Ambulatory Visit
Admission: RE | Admit: 2023-04-13 | Discharge: 2023-04-13 | Disposition: A | Discharge status: Home / Self Care | Payer: 59 | Source: Ambulatory Visit | Attending: Internal Medicine | Admitting: Internal Medicine

## 2023-04-13 DIAGNOSIS — R269 Unspecified abnormalities of gait and mobility: Secondary | ICD-10-CM

## 2023-04-13 DIAGNOSIS — H9313 Tinnitus, bilateral: Secondary | ICD-10-CM

## 2023-04-18 ENCOUNTER — Telehealth: Payer: Self-pay | Admitting: Internal Medicine

## 2023-04-18 ENCOUNTER — Other Ambulatory Visit: Payer: Self-pay | Admitting: Internal Medicine

## 2023-04-18 NOTE — Telephone Encounter (Signed)
Copied from CRM [id number]. Topic: General - Other >> Apr 18, 2023  3:31 PM Everette C wrote: Reason for CRM: The patient would like to speak with a member of staff when possible  The patient has received their immunizations record and shares that it indicates not recent flu shot  The patient would like to address these concerns when possible  Please contact further if/when available

## 2023-04-18 NOTE — Telephone Encounter (Signed)
Spoke with patient. Told him he had flu shot on 12/10/2022. He stated he needs proof of immunizations for his insurance. Printed immunization history, and faxed a copy to patient email.  Terri Skains

## 2023-04-18 NOTE — Telephone Encounter (Unsigned)
Copied from CRM [id number]. Topic: General - Other >> Apr 18, 2023  3:29 PM Everette C wrote: Reason for CRM: Medication Refill - Most Recent Primary Care Visit:  Provider: Reubin Milan Department: [facility] [place] Visit Type: OFFICE VISIT Date: 04/08/2023  Medication: sildenafil (VIAGRA) 100 MG tablet [[id number]] - the patient would like a quantity of 30 tablets if possible   Has the patient contacted their pharmacy? Yes (Agent: If no, request that the patient contact the pharmacy for the refill. If patient does not wish to contact the pharmacy document the reason why and proceed with request.) (Agent: If yes, when and what did the pharmacy advise?)  Is this the correct pharmacy for this prescription? Yes If no, delete pharmacy and type the correct one.  This is the patient's preferred pharmacy:  [id number] Jeanice Lim, [place] - [address] [place] [address] Phone: [phone] Fax: [phone]   Has the prescription been filled recently? No  Is the patient out of the medication? Yes  Has the patient been seen for an appointment in the last year OR does the patient have an upcoming appointment? Yes  Can we respond through MyChart? No  Agent: Please be advised that Rx refills may take up to 3 business days. We ask that you follow-up with your pharmacy.

## 2023-04-20 NOTE — Telephone Encounter (Signed)
Requested medication (s) are due for refill today - yes  Requested medication (s) are on the active medication list -yes  Future visit scheduled -yes  Last refill: 12/10/22 #10  Notes to clinic: duplicate request- request #30- request sent for review - high risk interaction warning  Requested Prescriptions  Pending Prescriptions Disp Refills   sildenafil (VIAGRA) 100 MG tablet 10 tablet 0    Sig: Take 1 tablet (100 mg total) by mouth as needed for erectile dysfunction.     Urology: Erectile Dysfunction Agents Passed - 04/20/2023  5:23 PM      Passed - AST in normal range and within 360 days    AST  Date Value Ref Range Status  12/10/2022 27 0 - 40 IU/L Final         Passed - ALT in normal range and within 360 days    ALT  Date Value Ref Range Status  12/10/2022 38 0 - 44 IU/L Final         Passed - Last BP in normal range    BP Readings from Last 1 Encounters:  04/08/23 126/72         Passed - Valid encounter within last 12 months    Recent Outpatient Visits           1 week ago Gait disturbance   [place] [facility] Medicine at [facility], Nyoka Cowden, MD   4 months ago Annual physical exam   [facility] Primary Care [facility] Medicine at [facility], Nyoka Cowden, MD   7 months ago Type II diabetes mellitus with complication [facility])    Primary Care & Sports Medicine at [facility], Nyoka Cowden, MD   1 year ago Elevated PSA, less than 10 ng/ml   [facility] Primary Care & Sports Medicine at [facility], Nyoka Cowden, MD   1 year ago Annual physical exam   [facility] Primary Care & Sports Medicine at [facility], Nyoka Cowden, MD       Future Appointments             In 1 week Judithann Graves Nyoka Cowden, MD [facility] Health Primary Care & Sports Medicine at [facility], [facility]   In 8 months Reubin Milan, MD [facility] Health Primary Care & Sports Medicine at [facility], [facility]                Requested Prescriptions  Pending Prescriptions Disp Refills   sildenafil (VIAGRA) 100 MG tablet 10 tablet 0    Sig: Take 1 tablet (100 mg total) by mouth as needed for erectile dysfunction.     Urology: Erectile Dysfunction Agents Passed - 04/20/2023  5:23 PM      Passed - AST in normal range and within 360 days    AST  Date Value Ref Range Status  12/10/2022 27 0 - 40 IU/L Final         Passed - ALT in normal range and within 360 days    ALT  Date Value Ref Range Status  12/10/2022 38 0 - 44 IU/L Final         Passed - Last BP in normal range    BP Readings from Last 1 Encounters:  04/08/23 126/72         Passed - Valid encounter within last 12 months    Recent Outpatient Visits           1 week  ago Gait disturbance   [place] [facility] at [facility], Nyoka Cowden, MD   4 months ago Annual physical exam   [facility] Medicine at [facility], Nyoka Cowden, MD   7 months ago Type II diabetes mellitus with complication [facility])   [place] Primary Care & Sports Medicine at [facility], Nyoka Cowden, MD   1 year ago Elevated PSA, less than 10 ng/ml   [facility] Sports Medicine at [facility], Nyoka Cowden, MD   1 year ago Annual physical exam   [facility] Primary Care & Sports Medicine at [facility], Nyoka Cowden, MD       Future Appointments             In 1 week Judithann Graves Nyoka Cowden, MD [facility] Primary Care & Sports Medicine at [facility], [facility]   In 8 months Judithann Graves, Nyoka Cowden, MD [facility] Primary Care & Sports Medicine at [facility], [facility]

## 2023-04-20 NOTE — Telephone Encounter (Signed)
Requested medication (s) are due for refill today - yes  Requested medication (s) are on the active medication list -yes  Future visit scheduled -yes  Last refill: 12/10/22 #10  Notes to clinic: high risk drug interaction warning- sent for review   Requested Prescriptions  Pending Prescriptions Disp Refills   sildenafil (VIAGRA) 100 MG tablet [Pharmacy Med Name: Sildenafil Citrate Oral Tablet 100 MG] 10 tablet 0    Sig: TAKE ONE TABLET BY MOUTH AS NEEDED FOR ERECTILE DYSFUNCTION     Urology: Erectile Dysfunction Agents Passed - 04/20/2023  5:21 PM      Passed - AST in normal range and within 360 days    AST  Date Value Ref Range Status  12/10/2022 27 0 - 40 IU/L Final         Passed - ALT in normal range and within 360 days    ALT  Date Value Ref Range Status  12/10/2022 38 0 - 44 IU/L Final         Passed - Last BP in normal range    BP Readings from Last 1 Encounters:  04/08/23 126/72         Passed - Valid encounter within last 12 months    Recent Outpatient Visits           1 week ago Gait disturbance   [place] [facility] at [facility], Nyoka Cowden, MD   4 months ago Annual physical exam   [facility] at [facility], Nyoka Cowden, MD   7 months ago Type II diabetes mellitus with complication [facility])   [place] [facility] at [facility], Nyoka Cowden, MD   1 year ago Elevated PSA, less than 10 ng/ml   [facility] at [facility], Nyoka Cowden, MD   1 year ago Annual physical exam   [place] [facility] at [facility], Nyoka Cowden, MD       Future Appointments             In 1 week Reubin Milan, MD [facility] at [facility], [facility]   In 8 months Reubin Milan, MD [facility] at [facility], [facility]                Requested Prescriptions  Pending Prescriptions Disp Refills   sildenafil (VIAGRA) 100 MG tablet [Pharmacy Med Name: Sildenafil Citrate Oral Tablet 100 MG] 10 tablet 0    Sig: TAKE ONE TABLET BY MOUTH AS NEEDED FOR ERECTILE DYSFUNCTION     Urology: Erectile Dysfunction Agents Passed - 04/20/2023  5:21 PM      Passed - AST in normal range and within 360 days    AST  Date Value Ref Range Status  12/10/2022 27 0 - 40 IU/L Final         Passed - ALT in normal range and within 360 days    ALT  Date Value Ref Range Status  12/10/2022 38 0 - 44 IU/L Final         Passed - Last BP in normal range    BP Readings from Last 1 Encounters:  04/08/23 126/72         Passed - Valid encounter within last 12 months    Recent Outpatient Visits  1 week ago Gait disturbance   [place] [facility] at [facility], Nyoka Cowden, MD   4 months ago Annual physical exam   [facility] at [facility], Nyoka Cowden, MD   7 months ago Type II diabetes mellitus with complication [facility])   [place] [facility] at [facility], Nyoka Cowden, MD   1 year ago Elevated PSA, less than 10 ng/ml   [facility] at [facility], Nyoka Cowden, MD   1 year ago Annual physical exam   [facility] at [facility], Nyoka Cowden, MD       Future Appointments             In 1 week Judithann Graves Nyoka Cowden, MD [facility] Health Primary Care & Sports Medicine at [facility], [facility]   In 8 months Judithann Graves, Nyoka Cowden, MD [facility] Primary Care & Sports Medicine at [facility], [facility]

## 2023-04-21 ENCOUNTER — Ambulatory Visit
Admission: RE | Admit: 2023-04-21 | Discharge: 2023-04-21 | Disposition: A | Discharge status: Home / Self Care | Payer: 59 | Source: Ambulatory Visit | Attending: Internal Medicine | Admitting: Internal Medicine

## 2023-04-21 DIAGNOSIS — H9313 Tinnitus, bilateral: Secondary | ICD-10-CM | POA: Diagnosis not present

## 2023-04-21 DIAGNOSIS — R930 Abnormal findings on diagnostic imaging of skull and head, not elsewhere classified: Secondary | ICD-10-CM | POA: Diagnosis not present

## 2023-04-21 DIAGNOSIS — R269 Unspecified abnormalities of gait and mobility: Secondary | ICD-10-CM | POA: Diagnosis not present

## 2023-04-29 ENCOUNTER — Encounter: Payer: Self-pay | Admitting: Internal Medicine

## 2023-04-29 ENCOUNTER — Ambulatory Visit: Payer: 59 | Admitting: Internal Medicine

## 2023-04-29 VITALS — BP 124/78 | HR 77 | Ht 71.0 in | Wt 256.0 lb

## 2023-04-29 DIAGNOSIS — R269 Unspecified abnormalities of gait and mobility: Secondary | ICD-10-CM | POA: Diagnosis not present

## 2023-04-29 DIAGNOSIS — E118 Type 2 diabetes mellitus with unspecified complications: Secondary | ICD-10-CM

## 2023-04-29 DIAGNOSIS — N529 Male erectile dysfunction, unspecified: Secondary | ICD-10-CM

## 2023-04-29 DIAGNOSIS — E785 Hyperlipidemia, unspecified: Secondary | ICD-10-CM

## 2023-04-29 DIAGNOSIS — E1169 Type 2 diabetes mellitus with other specified complication: Secondary | ICD-10-CM

## 2023-04-29 DIAGNOSIS — I10 Essential (primary) hypertension: Secondary | ICD-10-CM

## 2023-04-29 DIAGNOSIS — Z7984 Long term (current) use of oral hypoglycemic drugs: Secondary | ICD-10-CM | POA: Diagnosis not present

## 2023-04-29 HISTORY — DX: Unspecified abnormalities of gait and mobility: R26.9

## 2023-04-29 LAB — POCT GLYCOSYLATED HEMOGLOBIN (HGB A1C): Hemoglobin A1C: 6.5 % — AB (ref 4.0–5.6)

## 2023-04-29 MED ORDER — TADALAFIL 20 MG PO TABS: 10.0000 mg | ORAL_TABLET | ORAL | 0 refills | Status: DC | PRN

## 2023-04-29 NOTE — Progress Notes (Addendum)
 Date:  04/29/2023   Name:  Wyatt Mata   DOB:  1955/11/16   MRN:  [id number]   Chief Complaint: Diabetes and Hypertension  Diabetes He presents for his follow-up diabetic visit. He has type 2 diabetes mellitus. His disease course has been stable. Pertinent negatives for hypoglycemia include no headaches or tremors. Pertinent negatives for diabetes include no chest pain, no fatigue, no polydipsia and no polyuria. Current diabetic treatments: Jardiance  and ozempic .  Hypertension This is a chronic problem. The problem is controlled. Pertinent negatives include no chest pain, headaches, palpitations or shortness of breath.  Erectile Dysfunction This is a chronic problem. The problem has been gradually worsening since onset. The nature of his difficulty is maintaining erection and penetration. Pertinent negatives include no dysuria or hematuria. Past treatments include sildenafil . The treatment provided no relief.    Review of Systems  Constitutional:  Negative for appetite change, fatigue and unexpected weight change.  Eyes:  Negative for visual disturbance.  Respiratory:  Negative for cough, shortness of breath and wheezing.   Cardiovascular:  Negative for chest pain, palpitations and leg swelling.  Gastrointestinal:  Negative for abdominal pain and blood in stool.  Endocrine: Negative for polydipsia and polyuria.  Genitourinary:  Negative for dysuria and hematuria.       ED not responding to Viagra  -   Musculoskeletal:  Negative for arthralgias.  Skin:  Negative for color change and rash.  Neurological:  Negative for tremors, numbness and headaches.  Psychiatric/Behavioral:  Negative for dysphoric mood.      Lab Results  Component Value Date   NA 141 12/10/2022   K 5.3 (H) 12/10/2022   CO2 22 12/10/2022   GLUCOSE 96 12/10/2022   BUN 23 12/10/2022   CREATININE 1.09 12/10/2022   CALCIUM  9.9 12/10/2022   EGFR 74 12/10/2022   GFRNONAA 75 06/20/2020   Lab Results  Component  Value Date   CHOL 73 (L) 12/10/2022   HDL 38 (L) 12/10/2022   LDLCALC 20 12/10/2022   TRIG 67 12/10/2022   CHOLHDL 1.9 12/10/2022   Lab Results  Component Value Date   TSH 1.350 12/10/2022   Lab Results  Component Value Date   HGBA1C 6.5 (A) 04/29/2023   Lab Results  Component Value Date   WBC 6.1 12/10/2022   HGB 17.9 (H) 12/10/2022   HCT 53.6 (H) 12/10/2022   MCV 90 12/10/2022   PLT 167 12/10/2022   Lab Results  Component Value Date   ALT 38 12/10/2022   AST 27 12/10/2022   ALKPHOS 49 12/10/2022   BILITOT 0.7 12/10/2022   No results found for: MARIEN BOLLS, VD25OH   Patient Active Problem List   Diagnosis Date Noted   Obesity, morbid (HCC) 04/29/2023   Gait disturbance 04/29/2023   Osteoarthritis of left knee 09/10/2021   BMI 36.0-36.9,adult 01/02/2021   Neck pain 02/29/2020   Coronary artery disease involving coronary bypass graft of native heart with angina pectoris (HCC) 10/13/2018   AAA (abdominal aortic aneurysm) (HCC) 02/12/2018   Spondylolisthesis of lumbar region 11/30/2017   Lumbar radiculopathy 11/11/2017   DDD (degenerative disc disease), lumbar 10/10/2017   Type II diabetes mellitus with complication (HCC) 10/10/2015   Low serum testosterone  10/10/2015   Hematuria, gross 10/15/2014   Allergic rhinitis 08/05/2014   Hyperlipidemia associated with type 2 diabetes mellitus (HCC) 08/05/2014   Epididymal pain 08/05/2014   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   Essential (primary) hypertension 08/05/2014   Acid reflux  08/05/2014   History of colon polyps 08/05/2014   Obstructive apnea 08/05/2014   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    Allergies  Allergen Reactions   Topiramate Other (See Comments)    Severe headache   Cyclobenzaprine  Other (See Comments)    Abdominal pain    Past Surgical History:  Procedure Laterality Date   COLONOSCOPY  08/16/2014   tubular adenoma    COLONOSCOPY  08/24/2017   one polyp - repeat 5 yrs   CORONARY ANGIOPLASTY N/A 07/2020   CORONARY ANGIOPLASTY WITH STENT PLACEMENT  2013   CORONARY ANGIOPLASTY WITH STENT PLACEMENT  01/2015   2 stents  - post circ and OM2   CORONARY ARTERY BYPASS GRAFT  2004    Social History   Tobacco Use   Smoking status: Former   Smokeless tobacco: Never  Vaping Use   Vaping status: Never Used  Substance Use Topics   Alcohol use: No    Alcohol/week: 0.0 standard drinks of alcohol   Drug use: No     Medication list has been reviewed and updated.  Current Meds  Medication Sig   albuterol  (VENTOLIN  HFA) 108 (90 Base) MCG/ACT inhaler TAKE 2 PUFFS BY MOUTH EVERY 6 HOURS AS NEEDED FOR WHEEZE OR SHORTNESS OF BREATH   amLODipine  (NORVASC ) 5 MG tablet TAKE 1 TABLET (5 MG TOTAL) BY MOUTH DAILY.   Aspirin-Acetaminophen-Caffeine 500-325-65 MG PACK Take 1 packet by mouth 2 (two) times daily. Goodys   atorvastatin  (LIPITOR) 40 MG tablet Take 1 tablet (40 mg total) by mouth daily.   Choline Fenofibrate  (FENOFIBRIC ACID ) 135 MG CPDR TAKE 1 CAPSULE BY MOUTH EVERY DAY   clopidogrel  (PLAVIX ) 75 MG tablet TAKE 1 TABLET BY MOUTH EVERY DAY   gabapentin  (NEURONTIN ) 300 MG capsule TAKE 1 CAPSULE BY MOUTH FOUR TIMES A DAY   isosorbide mononitrate (IMDUR) 30 MG 24 hr tablet Take 30 mg by mouth daily.   JARDIANCE  25 MG TABS tablet TAKE 1 TABLET (25 MG TOTAL) BY MOUTH DAILY.   lisinopril  (ZESTRIL ) 10 MG tablet TAKE 1 TABLET BY MOUTH EVERY DAY   metFORMIN  (GLUCOPHAGE -XR) 500 MG 24 hr tablet TAKE 1 TABLET BY MOUTH EVERY DAY WITH BREAKFAST   metoprolol  succinate (TOPROL -XL) 100 MG 24 hr tablet TAKE 1 TABLET BY MOUTH EVERY DAY WITH OR IMMEDIATELY FOLLOWING A MEAL   REPATHA  SURECLICK 140 MG/ML SOAJ Inject into the skin every 14 (fourteen) days.   Semaglutide , 2 MG/DOSE, (OZEMPIC , 2 MG/DOSE,) 8 MG/3ML SOPN INJECT 2 MG AS DIRECTED ONCE A WEEK.   tadalafil  (CIALIS ) 20 MG tablet Take 0.5-1 tablets (10-20 mg total) by mouth  every other day as needed for erectile dysfunction.   [DISCONTINUED] sildenafil  (VIAGRA ) 100 MG tablet TAKE ONE TABLET BY MOUTH AS NEEDED FOR ERECTILE DYSFUNCTION       04/29/2023   11:15 AM 04/08/2023    2:38 PM 12/10/2022   10:23 AM 08/26/2022    8:37 AM  GAD 7 : Generalized Anxiety Score  Nervous, Anxious, on Edge 0 0 0 0  Control/stop worrying 0 0 0 0  Worry too much - different things 0 0 0 0  Trouble relaxing 0 0 0 0  Restless 0 0 0 0  Easily annoyed or irritable 0 0 0 0  Afraid - awful might happen 0 0 0 0  Total GAD 7 Score 0 0 0 0  Anxiety Difficulty Not difficult at all Not difficult at all Not difficult at all Not difficult at  all       04/29/2023   11:15 AM 04/08/2023    2:38 PM 12/10/2022   10:23 AM  Depression screen PHQ 2/9  Decreased Interest 0 0 0  Down, Depressed, Hopeless 0 0 0  PHQ - 2 Score 0 0 0  Altered sleeping  0 0  Tired, decreased energy  0 0  Change in appetite  0 0  Feeling bad or failure about yourself   0 0  Trouble concentrating  0 0  Moving slowly or fidgety/restless  0 0  Suicidal thoughts  0 0  PHQ-9 Score  0 0  Difficult doing work/chores  Not difficult at all Not difficult at all    BP Readings from Last 3 Encounters:  04/29/23 124/78  04/08/23 126/72  12/10/22 122/70    Physical Exam Vitals and nursing note reviewed.  Constitutional:      General: He is not in acute distress.    Appearance: Normal appearance. He is well-developed.  HENT:     Head: Normocephalic and atraumatic.  Cardiovascular:     Rate and Rhythm: Normal rate and regular rhythm.     Heart sounds: No murmur heard. Pulmonary:     Effort: Pulmonary effort is normal. No respiratory distress.     Breath sounds: No wheezing or rhonchi.  Musculoskeletal:     Cervical back: Normal range of motion.     Right lower leg: No edema.     Left lower leg: No edema.  Lymphadenopathy:     Cervical: No cervical adenopathy.  Skin:    General: Skin is warm and dry.      Findings: No rash.  Neurological:     General: No focal deficit present.     Mental Status: He is alert and oriented to person, place, and time.  Psychiatric:        Mood and Affect: Mood normal.        Behavior: Behavior normal.     Wt Readings from Last 3 Encounters:  04/29/23 256 lb (116.1 kg)  04/08/23 255 lb (115.7 kg)  12/10/22 243 lb 3.2 oz (110.3 kg)    BP 124/78   Pulse 77   Ht 5' 11 (1.803 m)   Wt 256 lb (116.1 kg)   SpO2 96%   BMI 35.70 kg/m   Assessment and Plan:  Problem List Items Addressed This Visit       Unprioritized   Hyperlipidemia associated with type 2 diabetes mellitus (HCC) (Chronic)   Relevant Medications   tadalafil  (CIALIS ) 20 MG tablet   Essential (primary) hypertension - Primary (Chronic)   Controlled BP with normal exam. Current regimen is amlodipine , lisinopril  and metoprolol . Will continue same medications; encourage continued reduced sodium diet.       Relevant Medications   tadalafil  (CIALIS ) 20 MG tablet   Type II diabetes mellitus with complication (HCC) (Chronic)   Blood sugars stable without hypoglycemic symptoms or events. Currently managed with MTF, Jardiance  and Ozempic . Changes made last visit are none. Lab Results  Component Value Date   HGBA1C 6.5 (H) 12/10/2022  A1C today = 6.5.  No change in therapy.       Relevant Orders   POCT glycosylated hemoglobin (Hb A1C) (Completed)   ED (erectile dysfunction) of organic origin   Ongoing issue now worse.  Most likely due to microvascular disease from DM with contribution from long standing HTN      Relevant Medications   tadalafil  (CIALIS ) 20 MG tablet  Obesity, morbid (HCC)   Gait disturbance   Still having similar symptoms but no new neurological deficits MRI completed but reading pending       Return in about 4 months (around 08/27/2023) for DM, HTN.    Leita HILARIO Adie, MD [facility] [place]

## 2023-04-29 NOTE — Assessment & Plan Note (Signed)
 Still having similar symptoms but no new neurological deficits MRI completed but reading pending

## 2023-04-29 NOTE — Assessment & Plan Note (Addendum)
 Blood sugars stable without hypoglycemic symptoms or events. Currently managed with MTF, Jardiance  and Ozempic . Changes made last visit are none. Lab Results  Component Value Date   HGBA1C 6.5 (H) 12/10/2022  A1C today = 6.5.  No change in therapy.

## 2023-04-29 NOTE — Assessment & Plan Note (Signed)
 Controlled BP with normal exam. Current regimen is amlodipine, lisinopril and metoprolol. Will continue same medications; encourage continued reduced sodium diet.

## 2023-04-29 NOTE — Assessment & Plan Note (Addendum)
 Ongoing issue now worse.  Most likely due to microvascular disease from DM with contribution from long standing HTN

## 2023-05-02 ENCOUNTER — Encounter: Payer: Self-pay | Admitting: Internal Medicine

## 2023-05-06 DIAGNOSIS — I739 Peripheral vascular disease, unspecified: Secondary | ICD-10-CM | POA: Diagnosis not present

## 2023-05-06 DIAGNOSIS — I7409 Other arterial embolism and thrombosis of abdominal aorta: Secondary | ICD-10-CM | POA: Diagnosis not present

## 2023-05-06 DIAGNOSIS — R42 Dizziness and giddiness: Secondary | ICD-10-CM | POA: Diagnosis not present

## 2023-05-06 DIAGNOSIS — I70203 Unspecified atherosclerosis of native arteries of extremities, bilateral legs: Secondary | ICD-10-CM | POA: Diagnosis not present

## 2023-05-06 DIAGNOSIS — I7143 Infrarenal abdominal aortic aneurysm, without rupture: Secondary | ICD-10-CM | POA: Diagnosis not present

## 2023-05-09 ENCOUNTER — Telehealth: Payer: Self-pay

## 2023-05-09 NOTE — Telephone Encounter (Signed)
 Please review.  KP

## 2023-05-09 NOTE — Telephone Encounter (Signed)
Pt given CT results per notes of Dr. Judithann Graves on 05/09/23. Pt verbalized understanding. Asked pt if he would like to proceed with the neurologist referral. Pt mentioned a Dr Renaldo Reel who is a urologist. Wyatt Mata pt PCP asking about a neurologist consult. He said he didn't know of any. Pt stated he wanted to talk to Dr. Judithann Graves herself. Advised pt she is with pts, but will message her nurse. Pt stated he wants a CD of the CT images. Discussed with pt about how to

## 2023-05-10 ENCOUNTER — Other Ambulatory Visit: Payer: Self-pay | Admitting: Internal Medicine

## 2023-05-10 DIAGNOSIS — H9313 Tinnitus, bilateral: Secondary | ICD-10-CM

## 2023-05-10 DIAGNOSIS — R269 Unspecified abnormalities of gait and mobility: Secondary | ICD-10-CM

## 2023-05-10 NOTE — Telephone Encounter (Signed)
 Called pt left VM to call back.  KP

## 2023-05-10 NOTE — Telephone Encounter (Addendum)
Pt called and states that he wants to see [facility] only.

## 2023-05-10 NOTE — Telephone Encounter (Signed)
 Please review.  KP

## 2023-05-10 NOTE — Progress Notes (Unsigned)
neur   Date:  05/10/2023   Name:  Wyatt Mata   DOB:  [birth year]   MRN:  [id number]   Chief Complaint: No chief complaint on file.  HPI  Review of Systems   Lab Results  Component Value Date   NA 141 12/10/2022   K 5.3 (H) 12/10/2022   CO2 22 12/10/2022   GLUCOSE 96 12/10/2022   BUN 23 12/10/2022   CREATININE 1.09 12/10/2022   CALCIUM 9.9 12/10/2022   EGFR 74 12/10/2022   GFRNONAA 75 06/20/2020   Lab Results  Component Value Date   CHOL 73 (L) 12/10/2022   HDL 38 (L) 12/10/2022   LDLCALC 20 12/10/2022   TRIG 67 12/10/2022   CHOLHDL 1.9 12/10/2022   Lab Results  Component Value Date   TSH 1.350 12/10/2022   Lab Results  Component Value Date   HGBA1C 6.5 (A) 04/29/2023   Lab Results  Component Value Date   WBC 6.1 12/10/2022   HGB 17.9 (H) 12/10/2022   HCT 53.6 (H) 12/10/2022   MCV 90 12/10/2022   PLT 167 12/10/2022   Lab Results  Component Value Date   ALT 38 12/10/2022   AST 27 12/10/2022   ALKPHOS 49 12/10/2022   BILITOT 0.7 12/10/2022   No results found for: "25OHVITD2", "25OHVITD3", "VD25OH"   Patient Active Problem List   Diagnosis Date Noted   Obesity, morbid (HCC) 04/29/2023   Gait disturbance 04/29/2023   Osteoarthritis of left knee 09/10/2021   BMI 36.0-36.9,adult 01/02/2021   Neck pain 02/29/2020   Coronary artery disease involving coronary bypass graft of native heart with angina pectoris (HCC) 10/13/2018   AAA (abdominal aortic aneurysm) (HCC) 02/12/2018   Spondylolisthesis of lumbar region 11/30/2017   Lumbar radiculopathy 11/11/2017   DDD (degenerative disc disease), lumbar 10/10/2017   Type II diabetes mellitus with complication (HCC) 10/10/2015   Low serum testosterone 10/10/2015   Hematuria, gross 10/15/2014   Allergic rhinitis 08/05/2014   Hyperlipidemia associated with type 2 diabetes mellitus (HCC) 08/05/2014   Epididymal pain 08/05/2014   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   Essential (primary)  hypertension 08/05/2014   Acid reflux 08/05/2014   History of colon polyps 08/05/2014   Obstructive apnea 08/05/2014   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    Allergies  Allergen Reactions   Topiramate Other (See Comments)    Severe headache   Cyclobenzaprine Other (See Comments)    Abdominal pain    Past Surgical History:  Procedure Laterality Date   COLONOSCOPY  08/16/2014   tubular adenoma   COLONOSCOPY  08/24/2017   one polyp - repeat 5 yrs   CORONARY ANGIOPLASTY N/A 07/2020   CORONARY ANGIOPLASTY WITH STENT PLACEMENT  2013   CORONARY ANGIOPLASTY WITH STENT PLACEMENT  01/2015   2 stents  - post circ and OM2   CORONARY ARTERY BYPASS GRAFT  2004    Social History   Tobacco Use   Smoking status: Former   Smokeless tobacco: Never  Vaping Use   Vaping status: Never Used  Substance Use Topics   Alcohol use: No    Alcohol/week: 0.0 standard drinks of alcohol   Drug use: No     Medication list has been reviewed and updated.  No outpatient medications have been marked as taking for the 05/10/23 encounter (Orders Only) with Reubin Milan, MD.       04/29/2023   11:15 AM 04/08/2023    2:38 PM 12/10/2022  10:23 AM 08/26/2022    8:37 AM  GAD 7 : Generalized Anxiety Score  Nervous, Anxious, on Edge 0 0 0 0  Control/stop worrying 0 0 0 0  Worry too much - different things 0 0 0 0  Trouble relaxing 0 0 0 0  Restless 0 0 0 0  Easily annoyed or irritable 0 0 0 0  Afraid - awful might happen 0 0 0 0  Total GAD 7 Score 0 0 0 0  Anxiety Difficulty Not difficult at all Not difficult at all Not difficult at all Not difficult at all       04/29/2023   11:15 AM 04/08/2023    2:38 PM 12/10/2022   10:23 AM  Depression screen PHQ 2/9  Decreased Interest 0 0 0  Down, Depressed, Hopeless 0 0 0  PHQ - 2 Score 0 0 0  Altered sleeping  0 0  Tired, decreased energy  0 0  Change in appetite  0 0  Feeling bad or failure about yourself   0  0  Trouble concentrating  0 0  Moving slowly or fidgety/restless  0 0  Suicidal thoughts  0 0  PHQ-9 Score  0 0  Difficult doing work/chores  Not difficult at all Not difficult at all    BP Readings from Last 3 Encounters:  04/29/23 124/78  04/08/23 126/72  12/10/22 122/70    Physical Exam  Wt Readings from Last 3 Encounters:  04/29/23 256 lb (116.1 kg)  04/08/23 255 lb (115.7 kg)  12/10/22 243 lb 3.2 oz (110.3 kg)    There were no vitals taken for this visit.  Assessment and Plan:  Problem List Items Addressed This Visit   None   No follow-ups on file.    Reubin Milan, MD [facility] Primary Care [facility] Sports Medicine [place]

## 2023-05-17 ENCOUNTER — Ambulatory Visit: Payer: Self-pay | Admitting: Internal Medicine

## 2023-05-17 NOTE — Telephone Encounter (Signed)
 Copied from CRM [id number]. Topic: Clinical - Red Word Triage >> May 17, 2023 11:43 AM Shon Hale wrote: Red Word that prompted transfer to Nurse Triage:  Prednisone requested - sciatic nerve pain with everystep.   Patient also has additional questions about imaging results.  Chief Complaint: pain  Symptoms: pain in low back that radiates to left led & down to foot, pain also radiates into right leg to knee - constant pain 4/10 & shooting pain 10/10 Frequency: x 2 to 3 weeks Pertinent Negatives: Patient denies numbness & tingling Disposition: [] ED /[] Urgent Care (no appt availability in office) / [x] Appointment(In office/virtual)/ []  [place] [facility]/ [] Home Care/ [] Refused Recommended Disposition /[] [facility]/ []  Follow-up with PCP Additional Notes: pt had accident 12/03/2022 then started getting injections to help with pain.  Pt thinks pain he is having currently is related to sciatic nerve pain and would like to try prednisone to treat.  Pt c/o with each step it sends pain up back & down legs & sometimes has to stop movement to help.  Reason for Disposition  [1] Pain radiates into the thigh or further down the leg AND [2] one leg  Answer Assessment - Initial Assessment Questions 1. ONSET: "When did the pain begin?"      X 2 to 3 weeks ago 2. LOCATION: "Where does it hurt?" (upper, mid or lower back)     Waist and down left leg to foot- every step sends pain up back and down leg and sometimes have to stop movement: at times in right leg stops at knee 3. SEVERITY: "How bad is the pain?"  (e.g., Scale 1-10; mild, moderate, or severe)   - MILD (1-3): Doesn't interfere with normal activities.    - MODERATE (4-7): Interferes with normal activities or awakens from sleep.    - SEVERE (8-10): Excruciating pain, unable to do any normal activities.      4/10 constant and 10/10 pain at highest 4. PATTERN: "Is the pain constant?" (e.g., yes, no; constant, intermittent)      Constant   5. RADIATION: "Does the pain shoot into your legs or somewhere else?"     N/a 6. CAUSE:  "What do you think is causing the back pain?"      Sciatic  7. BACK OVERUSE:  "Any recent lifting of heavy objects, strenuous work or exercise?"     N/a 8. MEDICINES: "What have you taken so far for the pain?" (e.g., nothing, acetaminophen, NSAIDS)     Goody powder in the AM and at PM 9. NEUROLOGIC SYMPTOMS: "Do you have any weakness, numbness, or problems with bowel/bladder control?"     N/a 10. OTHER SYMPTOMS: "Do you have any other symptoms?" (e.g., fever, abdomen pain, burning with urination, blood in urine)       N/a 11. PREGNANCY: "Is there any chance you are pregnant?" "When was your last menstrual period?"       N/a  Protocols used: Back Pain-A-AH

## 2023-05-19 ENCOUNTER — Ambulatory Visit: Payer: 59 | Admitting: Internal Medicine

## 2023-05-19 ENCOUNTER — Encounter: Payer: Self-pay | Admitting: Internal Medicine

## 2023-05-19 VITALS — BP 124/68 | HR 81 | Temp 97.7°F | Ht 71.0 in | Wt 255.5 lb

## 2023-05-19 DIAGNOSIS — R269 Unspecified abnormalities of gait and mobility: Secondary | ICD-10-CM

## 2023-05-19 DIAGNOSIS — M51361 Other intervertebral disc degeneration, lumbar region with lower extremity pain only: Secondary | ICD-10-CM | POA: Diagnosis not present

## 2023-05-19 DIAGNOSIS — H6692 Otitis media, unspecified, left ear: Secondary | ICD-10-CM | POA: Diagnosis not present

## 2023-05-19 MED ORDER — AZITHROMYCIN 250 MG PO TABS: ORAL_TABLET | ORAL | 0 refills | Status: AC

## 2023-05-19 MED ORDER — PREDNISONE 10 MG PO TABS: ORAL_TABLET | ORAL | 0 refills | Status: AC

## 2023-05-19 NOTE — Assessment & Plan Note (Addendum)
 03/2023:  CT IMPRESSION: 1. No evidence of acute intracranial abnormality. 2. Mild cerebral atrophy.  Symptoms resolved with onset of ear discomfort/fullness. If gait issues, return will refer to ENT

## 2023-05-19 NOTE — Assessment & Plan Note (Signed)
 Seen by pain management for ESI. Now having radiation into left leg. - no red flag s/s Will try steroid taper - he will follow up with pain management

## 2023-05-19 NOTE — Progress Notes (Signed)
 Date:  05/19/2023   Name:  Wyatt Mata   DOB:  September 11, 1955   MRN:  [id number]   Chief Complaint: Sciatica (Following up on sciatic leg pain) and Results (Requesting results from brain exam)  Back Pain This is a chronic problem. The problem has been waxing and waning since onset. The quality of the pain is described as burning and cramping. The pain radiates to the left thigh. The pain is moderate. Pertinent negatives include no abdominal pain, fever, numbness or weakness. Treatments tried: recent ESI without benefit - waiting to get approved for another round of injections.  Otalgia  There is pain in the left ear. This is a new problem. The current episode started in the past 7 days. The pain is mild. Associated symptoms include coughing and a sore throat. Pertinent negatives include no abdominal pain or ear discharge. He has tried nothing for the symptoms.  Gait disturbance -  CT Brain was normal.  He states that he began with left ear fullness and watery feeling at which point the gait issues resolved. He now has mild ear discomfort and drainage into his throat, sore throat and sinus pressure with yellow green mucus discharge.  Review of Systems  Constitutional:  Negative for chills, fatigue and fever.  HENT:  Positive for ear pain, sinus pressure and sore throat. Negative for ear discharge.   Respiratory:  Positive for cough.   Gastrointestinal:  Negative for abdominal pain.  Musculoskeletal:  Positive for back pain.  Neurological:  Negative for weakness and numbness.  Psychiatric/Behavioral:  Negative for dysphoric mood and sleep disturbance. The patient is not nervous/anxious.      Lab Results  Component Value Date   NA 141 12/10/2022   K 5.3 (H) 12/10/2022   CO2 22 12/10/2022   GLUCOSE 96 12/10/2022   BUN 23 12/10/2022   CREATININE 1.09 12/10/2022   CALCIUM 9.9 12/10/2022   EGFR 74 12/10/2022   GFRNONAA 75 06/20/2020   Lab Results  Component Value Date   CHOL 73 (L)  12/10/2022   HDL 38 (L) 12/10/2022   LDLCALC 20 12/10/2022   TRIG 67 12/10/2022   CHOLHDL 1.9 12/10/2022   Lab Results  Component Value Date   TSH 1.350 12/10/2022   Lab Results  Component Value Date   HGBA1C 6.5 (A) 04/29/2023   Lab Results  Component Value Date   WBC 6.1 12/10/2022   HGB 17.9 (H) 12/10/2022   HCT 53.6 (H) 12/10/2022   MCV 90 12/10/2022   PLT 167 12/10/2022   Lab Results  Component Value Date   ALT 38 12/10/2022   AST 27 12/10/2022   ALKPHOS 49 12/10/2022   BILITOT 0.7 12/10/2022   No results found for: "25OHVITD2", "25OHVITD3", "VD25OH"   Patient Active Problem List   Diagnosis Date Noted   Obesity, morbid (HCC) 04/29/2023   Gait disturbance 04/29/2023   Osteoarthritis of left knee 09/10/2021   BMI 36.0-36.9,adult 01/02/2021   Neck pain 02/29/2020   Coronary artery disease involving coronary bypass graft of native heart with angina pectoris (HCC) 10/13/2018   AAA (abdominal aortic aneurysm) (HCC) 02/12/2018   Spondylolisthesis of lumbar region 11/30/2017   Lumbar radiculopathy 11/11/2017   DDD (degenerative disc disease), lumbar 10/10/2017   Type II diabetes mellitus with complication (HCC) 10/10/2015   Low serum testosterone 10/10/2015   Hematuria, gross 10/15/2014   Allergic rhinitis 08/05/2014   Hyperlipidemia associated with type 2 diabetes mellitus (HCC) 08/05/2014   Epididymal pain 08/05/2014  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   Essential (primary) hypertension 08/05/2014   Acid reflux 08/05/2014   History of colon polyps 08/05/2014   Obstructive apnea 08/05/2014   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    Allergies  Allergen Reactions   Topiramate Other (See Comments)    Severe headache   Cyclobenzaprine Other (See Comments)    Abdominal pain    Past Surgical History:  Procedure Laterality Date   COLONOSCOPY  08/16/2014   tubular adenoma   COLONOSCOPY  08/24/2017    one polyp - repeat 5 yrs   CORONARY ANGIOPLASTY N/A 07/2020   CORONARY ANGIOPLASTY WITH STENT PLACEMENT  2013   CORONARY ANGIOPLASTY WITH STENT PLACEMENT  01/2015   2 stents  - post circ and OM2   CORONARY ARTERY BYPASS GRAFT  2004    Social History   Tobacco Use   Smoking status: Former   Smokeless tobacco: Never  Vaping Use   Vaping status: Never Used  Substance Use Topics   Alcohol use: No    Alcohol/week: 0.0 standard drinks of alcohol   Drug use: No     Medication list has been reviewed and updated.  Current Meds  Medication Sig   albuterol (VENTOLIN HFA) 108 (90 Base) MCG/ACT inhaler TAKE 2 PUFFS BY MOUTH EVERY 6 HOURS AS NEEDED FOR WHEEZE OR SHORTNESS OF BREATH   amLODipine (NORVASC) 5 MG tablet TAKE 1 TABLET (5 MG TOTAL) BY MOUTH DAILY.   Aspirin-Acetaminophen-Caffeine 500-325-65 MG PACK Take 1 packet by mouth 2 (two) times daily. Goodys   atorvastatin (LIPITOR) 40 MG tablet Take 1 tablet (40 mg total) by mouth daily.   azithromycin (ZITHROMAX Z-PAK) 250 MG tablet UAD   Choline Fenofibrate (FENOFIBRIC ACID) 135 MG CPDR TAKE 1 CAPSULE BY MOUTH EVERY DAY   clopidogrel (PLAVIX) 75 MG tablet TAKE 1 TABLET BY MOUTH EVERY DAY   gabapentin (NEURONTIN) 300 MG capsule TAKE 1 CAPSULE BY MOUTH FOUR TIMES A DAY   isosorbide mononitrate (IMDUR) 30 MG 24 hr tablet Take 30 mg by mouth daily.   JARDIANCE 25 MG TABS tablet TAKE 1 TABLET (25 MG TOTAL) BY MOUTH DAILY.   lisinopril (ZESTRIL) 10 MG tablet TAKE 1 TABLET BY MOUTH EVERY DAY   metFORMIN (GLUCOPHAGE-XR) 500 MG 24 hr tablet TAKE 1 TABLET BY MOUTH EVERY DAY WITH BREAKFAST   metoprolol succinate (TOPROL-XL) 100 MG 24 hr tablet TAKE 1 TABLET BY MOUTH EVERY DAY WITH OR IMMEDIATELY FOLLOWING A MEAL   predniSONE (DELTASONE) 10 MG tablet Take 6 tablets (60 mg total) by mouth daily with breakfast for 1 day, THEN 5 tablets (50 mg total) daily with breakfast for 1 day, THEN 4 tablets (40 mg total) daily with breakfast for 1 day, THEN 3  tablets (30 mg total) daily with breakfast for 1 day, THEN 2 tablets (20 mg total) daily with breakfast for 1 day, THEN 1 tablet (10 mg total) daily with breakfast for 1 day.   REPATHA SURECLICK 140 MG/ML SOAJ Inject into the skin every 14 (fourteen) days.   Semaglutide, 2 MG/DOSE, (OZEMPIC, 2 MG/DOSE,) 8 MG/3ML SOPN INJECT 2 MG AS DIRECTED ONCE A WEEK.   tadalafil (CIALIS) 20 MG tablet Take 0.5-1 tablets (10-20 mg total) by mouth every other day as needed for erectile dysfunction.       04/29/2023   11:15 AM 04/08/2023    2:38 PM 12/10/2022   10:23 AM 08/26/2022    8:37 AM  GAD 7 :  Generalized Anxiety Score  Nervous, Anxious, on Edge 0 0 0 0  Control/stop worrying 0 0 0 0  Worry too much - different things 0 0 0 0  Trouble relaxing 0 0 0 0  Restless 0 0 0 0  Easily annoyed or irritable 0 0 0 0  Afraid - awful might happen 0 0 0 0  Total GAD 7 Score 0 0 0 0  Anxiety Difficulty Not difficult at all Not difficult at all Not difficult at all Not difficult at all       04/29/2023   11:15 AM 04/08/2023    2:38 PM 12/10/2022   10:23 AM  Depression screen PHQ 2/9  Decreased Interest 0 0 0  Down, Depressed, Hopeless 0 0 0  PHQ - 2 Score 0 0 0  Altered sleeping  0 0  Tired, decreased energy  0 0  Change in appetite  0 0  Feeling bad or failure about yourself   0 0  Trouble concentrating  0 0  Moving slowly or fidgety/restless  0 0  Suicidal thoughts  0 0  PHQ-9 Score  0 0  Difficult doing work/chores  Not difficult at all Not difficult at all    BP Readings from Last 3 Encounters:  05/19/23 124/68  04/29/23 124/78  04/08/23 126/72    Physical Exam Vitals and nursing note reviewed.  Constitutional:      General: He is not in acute distress.    Appearance: Normal appearance. He is well-developed.  HENT:     Head: Normocephalic and atraumatic.     Right Ear: Tympanic membrane is erythematous and retracted.     Left Ear: Tympanic membrane is retracted.     Mouth/Throat:      Pharynx: No oropharyngeal exudate or posterior oropharyngeal erythema.  Cardiovascular:     Rate and Rhythm: Normal rate and regular rhythm.  Pulmonary:     Effort: Pulmonary effort is normal. No respiratory distress.     Breath sounds: Normal breath sounds. No wheezing.  Musculoskeletal:     Cervical back: Normal range of motion.     Lumbar back: Negative right straight leg raise test and negative left straight leg raise test.  Skin:    General: Skin is warm and dry.     Findings: No rash.  Neurological:     Mental Status: He is alert and oriented to person, place, and time.     Motor: Motor function is intact.     Gait: Gait is intact.     Deep Tendon Reflexes:     Reflex Scores:      Patellar reflexes are 2+ on the right side and 1+ on the left side. Psychiatric:        Mood and Affect: Mood normal.        Behavior: Behavior normal.     Wt Readings from Last 3 Encounters:  05/19/23 255 lb 8 oz (115.9 kg)  04/29/23 256 lb (116.1 kg)  04/08/23 255 lb (115.7 kg)    BP 124/68   Pulse 81   Temp 97.7 F (36.5 C)   Ht 5\' 11"  (1.803 m)   Wt 255 lb 8 oz (115.9 kg)   SpO2 96%   BMI 35.64 kg/m   Assessment and Plan:  Problem List Items Addressed This Visit       Unprioritized   DDD (degenerative disc disease), lumbar - Primary   Seen by pain management for ESI. Now having radiation into left leg. -  no red flag s/s Will try steroid taper - he will follow up with pain management      Relevant Medications   predniSONE (DELTASONE) 10 MG tablet   Gait disturbance   03/2023:  CT IMPRESSION: 1. No evidence of acute intracranial abnormality. 2. Mild cerebral atrophy.  Symptoms resolved with onset of ear discomfort/fullness. If gait issues, return will refer to ENT      Other Visit Diagnoses       Otitis of left ear       treat with Zpak   Relevant Medications   azithromycin (ZITHROMAX Z-PAK) 250 MG tablet       Return in about 3 months (around 08/16/2023) for  DM, HTN.    Reubin Milan, MD [facility] [place]

## 2023-05-24 ENCOUNTER — Other Ambulatory Visit: Payer: Self-pay | Admitting: Internal Medicine

## 2023-05-24 NOTE — Telephone Encounter (Unsigned)
 Copied from CRM [id number]. Topic: Clinical - Medication Refill >> May 24, 2023  3:02 PM Fredrica W wrote: Most Recent Primary Care Visit:  Provider: Reubin Milan  Department: [facility]  Visit Type: OFFICE VISIT  Date: 04/08/2023  Medication: ondansetron - low dose liquid for nausea  Has the patient contacted their pharmacy? No (Agent: If no, request that the patient contact the pharmacy for the refill. If patient does not wish to contact the pharmacy document the reason why and proceed with request.) (Agent: If yes, when and what did the pharmacy advise?)  Is this the correct pharmacy for this prescription? Yes If no, delete pharmacy and type the correct one.  This is the patient's preferred pharmacy:  [facility] [address] [place] [facility] [address] [place] [address] Phone: [phone] Fax: [phone]   Has the prescription been filled recently? No  Is the patient out of the medication? N/A  Has the patient been seen for an appointment in the last year OR does the patient have an upcoming appointment? Yes  Can we respond through MyChart? Yes  Agent: Please be advised that Rx refills may take up to 3 business days. We ask that you follow-up with your pharmacy.

## 2023-05-25 ENCOUNTER — Other Ambulatory Visit: Payer: Self-pay | Admitting: Internal Medicine

## 2023-05-25 NOTE — Progress Notes (Unsigned)
 Date:  05/25/2023   Name:  Wyatt Mata   DOB:  1955/12/01   MRN:  [id number]   Chief Complaint: No chief complaint on file.  HPI  Review of Systems   Lab Results  Component Value Date   NA 141 12/10/2022   K 5.3 (H) 12/10/2022   CO2 22 12/10/2022   GLUCOSE 96 12/10/2022   BUN 23 12/10/2022   CREATININE 1.09 12/10/2022   CALCIUM 9.9 12/10/2022   EGFR 74 12/10/2022   GFRNONAA 75 06/20/2020   Lab Results  Component Value Date   CHOL 73 (L) 12/10/2022   HDL 38 (L) 12/10/2022   LDLCALC 20 12/10/2022   TRIG 67 12/10/2022   CHOLHDL 1.9 12/10/2022   Lab Results  Component Value Date   TSH 1.350 12/10/2022   Lab Results  Component Value Date   HGBA1C 6.5 (A) 04/29/2023   Lab Results  Component Value Date   WBC 6.1 12/10/2022   HGB 17.9 (H) 12/10/2022   HCT 53.6 (H) 12/10/2022   MCV 90 12/10/2022   PLT 167 12/10/2022   Lab Results  Component Value Date   ALT 38 12/10/2022   AST 27 12/10/2022   ALKPHOS 49 12/10/2022   BILITOT 0.7 12/10/2022   No results found for: "25OHVITD2", "25OHVITD3", "VD25OH"   Patient Active Problem List   Diagnosis Date Noted   Obesity, morbid (HCC) 04/29/2023   Gait disturbance 04/29/2023   Osteoarthritis of left knee 09/10/2021   BMI 36.0-36.9,adult 01/02/2021   Neck pain 02/29/2020   Coronary artery disease involving coronary bypass graft of native heart with angina pectoris (HCC) 10/13/2018   AAA (abdominal aortic aneurysm) (HCC) 02/12/2018   Spondylolisthesis of lumbar region 11/30/2017   Lumbar radiculopathy 11/11/2017   DDD (degenerative disc disease), lumbar 10/10/2017   Type II diabetes mellitus with complication (HCC) 10/10/2015   Low serum testosterone 10/10/2015   Hematuria, gross 10/15/2014   Allergic rhinitis 08/05/2014   Hyperlipidemia associated with type 2 diabetes mellitus (HCC) 08/05/2014   Epididymal pain 08/05/2014   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   Essential (primary)  hypertension 08/05/2014   Acid reflux 08/05/2014   History of colon polyps 08/05/2014   Obstructive apnea 08/05/2014   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    Allergies  Allergen Reactions   Topiramate Other (See Comments)    Severe headache   Cyclobenzaprine Other (See Comments)    Abdominal pain    Past Surgical History:  Procedure Laterality Date   COLONOSCOPY  08/16/2014   tubular adenoma   COLONOSCOPY  08/24/2017   one polyp - repeat 5 yrs   CORONARY ANGIOPLASTY N/A 07/2020   CORONARY ANGIOPLASTY WITH STENT PLACEMENT  2013   CORONARY ANGIOPLASTY WITH STENT PLACEMENT  01/2015   2 stents  - post circ and OM2   CORONARY ARTERY BYPASS GRAFT  2004    Social History   Tobacco Use   Smoking status: Former   Smokeless tobacco: Never  Vaping Use   Vaping status: Never Used  Substance Use Topics   Alcohol use: No    Alcohol/week: 0.0 standard drinks of alcohol   Drug use: No     Medication list has been reviewed and updated.  No outpatient medications have been marked as taking for the 05/25/23 encounter (Orders Only) with Reubin Milan, MD.       04/29/2023   11:15 AM 04/08/2023    2:38 PM 12/10/2022  10:23 AM 08/26/2022    8:37 AM  GAD 7 : Generalized Anxiety Score  Nervous, Anxious, on Edge 0 0 0 0  Control/stop worrying 0 0 0 0  Worry too much - different things 0 0 0 0  Trouble relaxing 0 0 0 0  Restless 0 0 0 0  Easily annoyed or irritable 0 0 0 0  Afraid - awful might happen 0 0 0 0  Total GAD 7 Score 0 0 0 0  Anxiety Difficulty Not difficult at all Not difficult at all Not difficult at all Not difficult at all       04/29/2023   11:15 AM 04/08/2023    2:38 PM 12/10/2022   10:23 AM  Depression screen PHQ 2/9  Decreased Interest 0 0 0  Down, Depressed, Hopeless 0 0 0  PHQ - 2 Score 0 0 0  Altered sleeping  0 0  Tired, decreased energy  0 0  Change in appetite  0 0  Feeling bad or failure about yourself   0 0   Trouble concentrating  0 0  Moving slowly or fidgety/restless  0 0  Suicidal thoughts  0 0  PHQ-9 Score  0 0  Difficult doing work/chores  Not difficult at all Not difficult at all    BP Readings from Last 3 Encounters:  05/19/23 124/68  04/29/23 124/78  04/08/23 126/72    Physical Exam  Wt Readings from Last 3 Encounters:  05/19/23 255 lb 8 oz (115.9 kg)  04/29/23 256 lb (116.1 kg)  04/08/23 255 lb (115.7 kg)    There were no vitals taken for this visit.  Assessment and Plan:  Problem List Items Addressed This Visit   None   No follow-ups on file.    Reubin Milan, MD [facility] [place]

## 2023-05-25 NOTE — Telephone Encounter (Signed)
 Requested medication (s) are due for refill today -unknown  Requested medication (s) are on the active medication list -yes  Future visit scheduled -yes  Last refill: unknown  Notes to clinic: non delegated Rx- listed as historical medication   Requested Prescriptions  Pending Prescriptions Disp Refills   ondansetron (ZOFRAN) 4 MG/5ML solution 50 mL 0    Sig: Take 5 mLs (4 mg total) by mouth every 8 (eight) hours as needed for nausea or vomiting.     Not Delegated - Gastroenterology: Antiemetics - ondansetron Failed - 05/25/2023 10:09 AM      Failed - This refill cannot be delegated      Passed - AST in normal range and within 360 days    AST  Date Value Ref Range Status  12/10/2022 27 0 - 40 IU/L Final         Passed - ALT in normal range and within 360 days    ALT  Date Value Ref Range Status  12/10/2022 38 0 - 44 IU/L Final         Passed - Valid encounter within last 6 months    Recent Outpatient Visits           1 month ago Gait disturbance   [facility] at [facility], Nyoka Cowden, MD   5 months ago Annual physical exam   [facility] at [facility], Nyoka Cowden, MD   9 months ago Type II diabetes mellitus with complication [facility])   [place] [facility] at [facility], Nyoka Cowden, MD   1 year ago Elevated PSA, less than 10 ng/ml   [facility] at [facility], Nyoka Cowden, MD   1 year ago Annual physical exam   [facility] at [facility], Nyoka Cowden, MD       Future Appointments             In 2 months Judithann Graves Nyoka Cowden, MD [facility] at [facility], [facility]   In 6 months Judithann Graves Nyoka Cowden, MD [facility] Medicine at [facility], [facility]            Signed Prescriptions Disp Refills   ondansetron [facility]) 4  MG/5ML solution      Sig: Take 4 mg by mouth once.     There is no refill protocol information for this order       Requested Prescriptions  Pending Prescriptions Disp Refills   ondansetron (ZOFRAN) 4 MG/5ML solution 50 mL 0    Sig: Take 5 mLs (4 mg total) by mouth every 8 (eight) hours as needed for nausea or vomiting.     Not Delegated - Gastroenterology: Antiemetics - ondansetron Failed - 05/25/2023 10:09 AM      Failed - This refill cannot be delegated      Passed - AST in normal range and within 360 days    AST  Date Value Ref Range Status  12/10/2022 27 0 - 40 IU/L Final         Passed - ALT in normal range and within 360 days    ALT  Date Value Ref Range Status  12/10/2022 38 0 - 44 IU/L Final         Passed - Valid encounter within last 6 months  Recent Outpatient Visits           1 month ago Gait disturbance   [place] [facility] at [facility], Nyoka Cowden, MD   5 months ago Annual physical exam   [facility] at [facility], Nyoka Cowden, MD   9 months ago Type II diabetes mellitus with complication [place])   [place] [facility] at [facility], Nyoka Cowden, MD   1 year ago Elevated PSA, less than 10 ng/ml   [facility] Medicine at [facility], Nyoka Cowden, MD   1 year ago Annual physical exam   [facility] Medicine at [facility], Nyoka Cowden, MD       Future Appointments             In 2 months Judithann Graves Nyoka Cowden, MD [facility] Primary Care & Sports Medicine at [facility], [facility]   In 6 months Judithann Graves Nyoka Cowden, MD [facility] Primary Care & [facility] Medicine at [facility], [facility]            Signed Prescriptions Disp Refills   ondansetron [facility]) 4 MG/5ML solution      Sig: Take 4 mg by mouth once.     There is no refill protocol information for this order

## 2023-06-02 DIAGNOSIS — Z136 Encounter for screening for cardiovascular disorders: Secondary | ICD-10-CM | POA: Diagnosis not present

## 2023-06-02 DIAGNOSIS — I739 Peripheral vascular disease, unspecified: Secondary | ICD-10-CM | POA: Diagnosis not present

## 2023-06-02 DIAGNOSIS — R29818 Other symptoms and signs involving the nervous system: Secondary | ICD-10-CM | POA: Diagnosis not present

## 2023-06-09 ENCOUNTER — Other Ambulatory Visit: Payer: Self-pay | Admitting: Internal Medicine

## 2023-06-09 DIAGNOSIS — E1169 Type 2 diabetes mellitus with other specified complication: Secondary | ICD-10-CM

## 2023-06-09 DIAGNOSIS — N529 Male erectile dysfunction, unspecified: Secondary | ICD-10-CM

## 2023-06-09 DIAGNOSIS — I10 Essential (primary) hypertension: Secondary | ICD-10-CM

## 2023-06-10 NOTE — Telephone Encounter (Signed)
 Requested Prescriptions  Pending Prescriptions Disp Refills   tadalafil (CIALIS) 20 MG tablet [Pharmacy Med Name: TADALAFIL 20 MG TABLET] 6 tablet 1    Sig: TAKE 1/2 TO 1 TABLET (10-20 MG TOTAL) BY MOUTH EVERY OTHER DAY AS NEEDED FOR ERECTILE DYSFUNCTION     Urology: Erectile Dysfunction Agents Passed - 06/10/2023  2:38 PM      Passed - AST in normal range and within 360 days    AST  Date Value Ref Range Status  12/10/2022 27 0 - 40 IU/L Final         Passed - ALT in normal range and within 360 days    ALT  Date Value Ref Range Status  12/10/2022 38 0 - 44 IU/L Final         Passed - Last BP in normal range    BP Readings from Last 1 Encounters:  05/19/23 124/68         Passed - Valid encounter within last 12 months    Recent Outpatient Visits           2 months ago Gait disturbance   [place] [facility], Nyoka Cowden, MD   6 months ago Annual physical exam   [facility] at [facility], Nyoka Cowden, MD   9 months ago Type II diabetes mellitus with complication [facility])   [place] [facility] at [facility], Nyoka Cowden, MD   1 year ago Elevated PSA, less than 10 ng/ml   [facility] at [facility], Nyoka Cowden, MD   1 year ago Annual physical exam   [facility] & Sports [facility] at [facility], Nyoka Cowden, MD       Future Appointments             In 2 months Judithann Graves Nyoka Cowden, MD [facility] Health Primary Care & Sports Medicine at [facility], [facility]   In 6 months Reubin Milan, MD [facility] Primary Care & Sports Medicine at [facility], [facility]            Refused Prescriptions Disp Refills   metoprolol succinate (TOPROL-XL) 100 MG 24 hr tablet [Pharmacy Med Name: METOPROLOL SUCC ER 100 MG TAB] 90 tablet 0    Sig: TAKE 1 TABLET BY MOUTH EVERY DAY WITH OR IMMEDIATELY FOLLOWING A MEAL      Cardiovascular:  Beta Blockers Passed - 06/10/2023  2:38 PM      Passed - Last BP in normal range    BP Readings from Last 1 Encounters:  05/19/23 124/68         Passed - Last Heart Rate in normal range    Pulse Readings from Last 1 Encounters:  05/19/23 81         Passed - Valid encounter within last 6 months    Recent Outpatient Visits           2 months ago Gait disturbance   [place] [facility] at [facility], Nyoka Cowden, MD   6 months ago Annual physical exam   [facility] at [facility], Nyoka Cowden, MD   9 months ago Type II diabetes mellitus with complication [facility])   [place] [facility] at [facility], Nyoka Cowden, MD   1 year  ago Elevated PSA, less than 10 ng/ml   [facility] at [facility], Nyoka Cowden, MD   1 year ago Annual physical exam   [facility] at [facility], Nyoka Cowden, MD       Future Appointments             In 2 months Judithann Graves Nyoka Cowden, MD [facility] Primary Care & Sports Medicine at [facility], [facility]   In 6 months Judithann Graves Nyoka Cowden, MD [facility] at [facility], [facility]             amLODipine (NORVASC) 5 MG tablet [Pharmacy Med Name: AMLODIPINE BESYLATE 5 MG TAB] 90 tablet 0    Sig: TAKE 1 TABLET (5 MG TOTAL) BY MOUTH DAILY.     Cardiovascular: Calcium Channel Blockers 2 Passed - 06/10/2023  2:38 PM      Passed - Last BP in normal range    BP Readings from Last 1 Encounters:  05/19/23 124/68         Passed - Last Heart Rate in normal range    Pulse Readings from Last 1 Encounters:  05/19/23 81         Passed - Valid encounter within last 6 months    Recent Outpatient Visits           2 months ago Gait disturbance   [place] [facility] at [facility], Nyoka Cowden, MD   6  months ago Annual physical exam   [facility] at [facility], Nyoka Cowden, MD   9 months ago Type II diabetes mellitus with complication [facility])   [place] [facility] at [facility], Nyoka Cowden, MD   1 year ago Elevated PSA, less than 10 ng/ml   [facility] & Sports [facility] at [facility], Nyoka Cowden, MD   1 year ago Annual physical exam   [facility] at [facility], Nyoka Cowden, MD       Future Appointments             In 2 months Judithann Graves Nyoka Cowden, MD [facility] Health Primary Care & Sports Medicine at [facility], [facility]   In 6 months Reubin Milan, MD [facility] Health Primary Care & Sports Medicine at [facility], [facility]             lisinopril (ZESTRIL) 10 MG tablet [Pharmacy Med Name: LISINOPRIL 10 MG TABLET] 90 tablet 0    Sig: TAKE 1 TABLET BY MOUTH EVERY DAY     Cardiovascular:  ACE Inhibitors Failed - 06/10/2023  2:38 PM      Failed - Cr in normal range and within 180 days    Creatinine, Ser  Date Value Ref Range Status  12/10/2022 1.09 0.76 - 1.27 mg/dL Final         Failed - K in normal range and within 180 days    Potassium  Date Value Ref Range Status  12/10/2022 5.3 (H) 3.5 - 5.2 mmol/L Final         Passed - Patient is not pregnant      Passed - Last BP in normal range    BP Readings from Last 1 Encounters:  05/19/23 124/68         Passed - Valid  encounter within last 6 months    Recent Outpatient Visits           2 months ago Gait disturbance   [place] [facility] at [facility], Nyoka Cowden, MD   6 months ago Annual physical exam   [place] [facility] at [facility], Nyoka Cowden, MD   9 months ago Type II diabetes mellitus with complication [facility])   [place] [facility] Medicine at [facility], Nyoka Cowden, MD   1 year ago  Elevated PSA, less than 10 ng/ml   [facility] Health Primary Care & Sports Medicine at [facility], Nyoka Cowden, MD   1 year ago Annual physical exam   [place] [facility] at [facility], Nyoka Cowden, MD       Future Appointments             In 2 months Reubin Milan, MD [facility] Health Primary Care & Sports Medicine at [facility], [facility]   In 6 months Reubin Milan, MD [facility] Medicine at [facility], [facility]             Choline Fenofibrate (FENOFIBRIC ACID) 135 MG CPDR [Pharmacy Med Name: FENOFIBRIC ACID DR 135 MG CAP] 90 capsule 3    Sig: TAKE 1 CAPSULE BY MOUTH EVERY DAY     Cardiovascular:  Antilipid - Fibric Acid Derivatives Failed - 06/10/2023  2:38 PM      Failed - HGB in normal range and within 360 days    Hemoglobin  Date Value Ref Range Status  12/10/2022 17.9 (H) 13.0 - 17.7 g/dL Final         Failed - HCT in normal range and within 360 days    Hematocrit  Date Value Ref Range Status  12/10/2022 53.6 (H) 37.5 - 51.0 % Final         Failed - Lipid Panel in normal range within the last 12 months    Cholesterol, Total  Date Value Ref Range Status  12/10/2022 73 (L) 100 - 199 mg/dL Final   LDL Chol Calc (NIH)  Date Value Ref Range Status  12/10/2022 20 0 - 99 mg/dL Final   HDL  Date Value Ref Range Status  12/10/2022 38 (L) >39 mg/dL Final   Triglycerides  Date Value Ref Range Status  12/10/2022 67 0 - 149 mg/dL Final         Passed - ALT in normal range and within 360 days    ALT  Date Value Ref Range Status  12/10/2022 38 0 - 44 IU/L Final         Passed - AST in normal range and within 360 days    AST  Date Value Ref Range Status  12/10/2022 27 0 - 40 IU/L Final         Passed - Cr in normal range and within 360 days    Creatinine, Ser  Date Value Ref Range Status  12/10/2022 1.09 0.76 - 1.27 mg/dL Final         Passed - PLT in normal range and within 360 days     Platelets  Date Value Ref Range Status  12/10/2022 167 150 - 450 x10E3/uL Final         Passed - WBC in normal range and within 360 days    WBC  Date Value Ref Range  Status  12/10/2022 6.1 3.4 - 10.8 x10E3/uL Final         Passed - eGFR is 30 or above and within 360 days    GFR calc Af Amer  Date Value Ref Range Status  02/29/2020 82 >59 mL/min/1.73 Final    Comment:    **In accordance with recommendations from the NKF-ASN Task force,**   [facility] is in the process of updating its eGFR calculation to the   2021 CKD-EPI creatinine equation that estimates kidney function   without a race variable.    GFR calc non Af Amer  Date Value Ref Range Status  06/20/2020 75  Final   eGFR  Date Value Ref Range Status  12/10/2022 74 >59 mL/min/1.73 Final         Passed - Valid encounter within last 12 months    Recent Outpatient Visits           2 months ago Gait disturbance   [place] [facility] at [facility], Nyoka Cowden, MD   6 months ago Annual physical exam   [facility] at [facility], Nyoka Cowden, MD   9 months ago Type II diabetes mellitus with complication [facility])   [place] [facility] Medicine at [facility], Nyoka Cowden, MD   1 year ago Elevated PSA, less than 10 ng/ml   [facility] Sports Medicine at [facility], Nyoka Cowden, MD   1 year ago Annual physical exam   [facility] Sports Medicine at [facility], Nyoka Cowden, MD       Future Appointments             In 2 months Judithann Graves Nyoka Cowden, MD [facility] Health Primary Care & Sports Medicine at [facility], [facility]   In 6 months Judithann Graves, Nyoka Cowden, MD [facility] Health Primary Care & Sports Medicine at [facility], [facility]

## 2023-06-10 NOTE — Telephone Encounter (Signed)
 Requested too soon earliest refill due 07/07/23 latest due in May 2025. Requested Prescriptions  Pending Prescriptions Disp Refills   metoprolol succinate (TOPROL-XL) 100 MG 24 hr tablet [Pharmacy Med Name: METOPROLOL SUCC ER 100 MG TAB] 90 tablet 0    Sig: TAKE 1 TABLET BY MOUTH EVERY DAY WITH OR IMMEDIATELY FOLLOWING A MEAL     Cardiovascular:  Beta Blockers Passed - 06/10/2023  2:37 PM      Passed - Last BP in normal range    BP Readings from Last 1 Encounters:  05/19/23 124/68         Passed - Last Heart Rate in normal range    Pulse Readings from Last 1 Encounters:  05/19/23 81         Passed - Valid encounter within last 6 months    Recent Outpatient Visits           2 months ago Gait disturbance   [place] [facility] at [facility], Nyoka Cowden, MD   6 months ago Annual physical exam   [facility] Sports Medicine at [facility], Nyoka Cowden, MD   9 months ago Type II diabetes mellitus with complication [facility])   [place] [facility] at [facility], Nyoka Cowden, MD   1 year ago Elevated PSA, less than 10 ng/ml   [facility] Primary Care & Sports Medicine at [facility], Nyoka Cowden, MD   1 year ago Annual physical exam   [facility] Primary Care & Sports Medicine at [facility], Nyoka Cowden, MD       Future Appointments             In 2 months Judithann Graves Nyoka Cowden, MD [facility] Health Primary Care & Sports Medicine at [facility], [facility]   In 6 months Reubin Milan, MD [facility] Health Primary Care & Sports Medicine at [facility], [facility]             tadalafil (CIALIS) 20 MG tablet [Pharmacy Med Name: TADALAFIL 20 MG TABLET] 6 tablet 1    Sig: TAKE 1/2 TO 1 TABLET (10-20 MG TOTAL) BY MOUTH EVERY OTHER DAY AS NEEDED FOR ERECTILE DYSFUNCTION     Urology: Erectile Dysfunction Agents Passed - 06/10/2023  2:37 PM      Passed - AST in normal range and within 360 days     AST  Date Value Ref Range Status  12/10/2022 27 0 - 40 IU/L Final         Passed - ALT in normal range and within 360 days    ALT  Date Value Ref Range Status  12/10/2022 38 0 - 44 IU/L Final         Passed - Last BP in normal range    BP Readings from Last 1 Encounters:  05/19/23 124/68         Passed - Valid encounter within last 12 months    Recent Outpatient Visits           2 months ago Gait disturbance   [place] [facility] at [facility], Nyoka Cowden, MD   6 months ago Annual physical exam   [facility] Primary [facility] & Sports Medicine at [facility], Nyoka Cowden, MD   9 months ago Type II diabetes mellitus with complication [facility])   [facility] Primary Care & Sports Medicine at [facility],  Nyoka Cowden, MD   1 year ago Elevated PSA, less than 10 ng/ml   [facility] Primary Care & Sports Medicine at [facility], Nyoka Cowden, MD   1 year ago Annual physical exam   [facility] Primary Care & Sports Medicine at [facility], Nyoka Cowden, MD       Future Appointments             In 2 months Judithann Graves Nyoka Cowden, MD [facility] Health Primary Care & Sports Medicine at [facility], [facility]   In 6 months Judithann Graves Nyoka Cowden, MD [facility] Health Primary Care & Sports Medicine at [facility], [facility]             amLODipine (NORVASC) 5 MG tablet [Pharmacy Med Name: AMLODIPINE BESYLATE 5 MG TAB] 90 tablet 0    Sig: TAKE 1 TABLET (5 MG TOTAL) BY MOUTH DAILY.     Cardiovascular: Calcium Channel Blockers 2 Passed - 06/10/2023  2:37 PM      Passed - Last BP in normal range    BP Readings from Last 1 Encounters:  05/19/23 124/68         Passed - Last Heart Rate in normal range    Pulse Readings from Last 1 Encounters:  05/19/23 81         Passed - Valid encounter within last 6 months    Recent Outpatient Visits           2 months ago Gait disturbance   [place] [facility] at  [facility], Nyoka Cowden, MD   6 months ago Annual physical exam   [facility] & Sports Medicine at [facility], Nyoka Cowden, MD   9 months ago Type II diabetes mellitus with complication [facility])   [place] [facility] Care & Sports Medicine at [facility], Nyoka Cowden, MD   1 year ago Elevated PSA, less than 10 ng/ml   [facility] Health Primary Care & Sports Medicine at [facility], Nyoka Cowden, MD   1 year ago Annual physical exam   [facility] Health Primary Care & Sports Medicine at [facility], Nyoka Cowden, MD       Future Appointments             In 2 months Judithann Graves Nyoka Cowden, MD [facility] Health Primary Care & Sports Medicine at [facility], [facility]   In 6 months Reubin Milan, MD [facility] Health Primary Care & Sports Medicine at [facility], [facility]             lisinopril (ZESTRIL) 10 MG tablet [Pharmacy Med Name: LISINOPRIL 10 MG TABLET] 90 tablet 0    Sig: TAKE 1 TABLET BY MOUTH EVERY DAY     Cardiovascular:  ACE Inhibitors Failed - 06/10/2023  2:37 PM      Failed - Cr in normal range and within 180 days    Creatinine, Ser  Date Value Ref Range Status  12/10/2022 1.09 0.76 - 1.27 mg/dL Final         Failed - K in normal range and within 180 days    Potassium  Date Value Ref Range Status  12/10/2022 5.3 (H) 3.5 - 5.2 mmol/L Final         Passed - Patient is not pregnant      Passed - Last BP in normal range    BP Readings from Last 1 Encounters:  05/19/23 124/68  Passed - Valid encounter within last 6 months    Recent Outpatient Visits           2 months ago Gait disturbance   [place] [facility] at [facility], Nyoka Cowden, MD   6 months ago Annual physical exam   [place] Primary Care & Sports Medicine at [facility], Nyoka Cowden, MD   9 months ago Type II diabetes mellitus with complication [facility])   [place] [facility] Care & Sports Medicine at [facility], Nyoka Cowden, MD   1 year ago Elevated PSA, less than 10 ng/ml   [facility] Primary Care & Sports Medicine at [facility], Nyoka Cowden, MD   1 year ago Annual physical exam   [place] [facility] & Sports Medicine at [facility], Nyoka Cowden, MD       Future Appointments             In 2 months Reubin Milan, MD [facility] Health Primary Care & Sports Medicine at [facility], [facility]   In 6 months Reubin Milan, MD [facility] Health Primary Care & Sports Medicine at [facility], [facility]             Choline Fenofibrate (FENOFIBRIC ACID) 135 MG CPDR [Pharmacy Med Name: FENOFIBRIC ACID DR 135 MG CAP] 90 capsule 3    Sig: TAKE 1 CAPSULE BY MOUTH EVERY DAY     Cardiovascular:  Antilipid - Fibric Acid Derivatives Failed - 06/10/2023  2:37 PM      Failed - HGB in normal range and within 360 days    Hemoglobin  Date Value Ref Range Status  12/10/2022 17.9 (H) 13.0 - 17.7 g/dL Final         Failed - HCT in normal range and within 360 days    Hematocrit  Date Value Ref Range Status  12/10/2022 53.6 (H) 37.5 - 51.0 % Final         Failed - Lipid Panel in normal range within the last 12 months    Cholesterol, Total  Date Value Ref Range Status  12/10/2022 73 (L) 100 - 199 mg/dL Final   LDL Chol Calc (NIH)  Date Value Ref Range Status  12/10/2022 20 0 - 99 mg/dL Final   HDL  Date Value Ref Range Status  12/10/2022 38 (L) >39 mg/dL Final   Triglycerides  Date Value Ref Range Status  12/10/2022 67 0 - 149 mg/dL Final         Passed - ALT in normal range and within 360 days    ALT  Date Value Ref Range Status  12/10/2022 38 0 - 44 IU/L Final         Passed - AST in normal range and within 360 days    AST  Date Value Ref Range Status  12/10/2022 27 0 - 40 IU/L Final         Passed - Cr in normal range and within 360 days    Creatinine, Ser  Date Value Ref Range Status  12/10/2022 1.09 0.76 - 1.27 mg/dL Final         Passed -  PLT in normal range and within 360 days    Platelets  Date Value Ref Range Status  12/10/2022 167 150 - 450 x10E3/uL Final         Passed - WBC in normal range and within 360 days    WBC  Date  Value Ref Range Status  12/10/2022 6.1 3.4 - 10.8 x10E3/uL Final         Passed - eGFR is 30 or above and within 360 days    GFR calc Af Amer  Date Value Ref Range Status  02/29/2020 82 >59 mL/min/1.73 Final    Comment:    **In accordance with recommendations from the NKF-ASN Task force,**   [facility] is in the process of updating its eGFR calculation to the   2021 CKD-EPI creatinine equation that estimates kidney function   without a race variable.    GFR calc non Af Amer  Date Value Ref Range Status  06/20/2020 75  Final   eGFR  Date Value Ref Range Status  12/10/2022 74 >59 mL/min/1.73 Final         Passed - Valid encounter within last 12 months    Recent Outpatient Visits           2 months ago Gait disturbance   [place] [facility] at [facility], Nyoka Cowden, MD   6 months ago Annual physical exam   [facility] Health Primary [facility] & Sports [facility] at [facility], Nyoka Cowden, MD   9 months ago Type II diabetes mellitus with complication [facility])   [place] Primary Care & Sports Medicine at [facility], Nyoka Cowden, MD   1 year ago Elevated PSA, less than 10 ng/ml   [facility] & Sports Medicine at [facility], Nyoka Cowden, MD   1 year ago Annual physical exam   [facility] Health Primary Care & Sports Medicine at [facility], Nyoka Cowden, MD       Future Appointments             In 2 months Judithann Graves Nyoka Cowden, MD [facility] Health Primary Care & Sports Medicine at [facility], [facility]   In 6 months Judithann Graves, Nyoka Cowden, MD [facility] Health Primary Care & Sports Medicine at [facility], [facility]

## 2023-07-01 ENCOUNTER — Telehealth: Payer: Self-pay

## 2023-07-01 NOTE — Telephone Encounter (Signed)
 PA completed for Ozempic (2 MG/DOSE) 8MG /3ML pen-injectors on [email or web address]   (Key: [id number]) Rx #: [id number]  Awaiting outcome.

## 2023-07-01 NOTE — Telephone Encounter (Signed)
 [place] APPROVED  Request Reference Number: [id number]  Authorization Expiration Date: March 21, 2024.

## 2023-07-27 ENCOUNTER — Other Ambulatory Visit: Payer: Self-pay | Admitting: Internal Medicine

## 2023-07-27 DIAGNOSIS — N529 Male erectile dysfunction, unspecified: Secondary | ICD-10-CM

## 2023-07-29 NOTE — Telephone Encounter (Signed)
 Requested Prescriptions  Pending Prescriptions Disp Refills   tadalafil  (CIALIS ) 20 MG tablet [Pharmacy Med Name: TADALAFIL  20 MG TABLET] 6 tablet 1    Sig: TAKE 1/2 TO 1 TABLET (10-20 MG TOTAL) BY MOUTH EVERY OTHER DAY AS NEEDED FOR ERECTILE DYSFUNCTION     Urology: Erectile Dysfunction Agents Passed - 07/29/2023  8:15 AM      Passed - AST in normal range and within 360 days    AST  Date Value Ref Range Status  12/10/2022 27 0 - 40 IU/L Final         Passed - ALT in normal range and within 360 days    ALT  Date Value Ref Range Status  12/10/2022 38 0 - 44 IU/L Final         Passed - Last BP in normal range    BP Readings from Last 1 Encounters:  05/19/23 124/68         Passed - Valid encounter within last 12 months    Recent Outpatient Visits           2 months ago Degeneration of intervertebral disc of lumbar region with lower extremity pain   [place] [facility], Chales [place], MD   3 months ago Essential (primary) hypertension   [place] [facility] at [facility], Chales [place], MD       Future Appointments             In 5 days Gala Jubilee, [place]ales [place], MD [facility] Health Primary Care & Sports Medicine at [facility], [facility]   In 4 months Gala Jubilee, [place]ales [place], MD [facility] Health Primary Care & Sports Medicine at [facility], [facility]

## 2023-08-03 ENCOUNTER — Ambulatory Visit: Admitting: Internal Medicine

## 2023-08-03 ENCOUNTER — Encounter: Payer: Self-pay | Admitting: Internal Medicine

## 2023-08-04 ENCOUNTER — Other Ambulatory Visit: Payer: Self-pay | Admitting: Internal Medicine

## 2023-08-04 DIAGNOSIS — E1169 Type 2 diabetes mellitus with other specified complication: Secondary | ICD-10-CM

## 2023-08-04 DIAGNOSIS — I10 Essential (primary) hypertension: Secondary | ICD-10-CM

## 2023-08-04 DIAGNOSIS — E118 Type 2 diabetes mellitus with unspecified complications: Secondary | ICD-10-CM

## 2023-08-08 NOTE — Telephone Encounter (Signed)
 Requested medication (s) are due for refill today: lisinopril  yes clopidogrel  no  Requested medication (s) are on the active medication list: yes  Last refill:  Lisinopril   04/08/23 #90 Clopidogrel : 11/01/22 90/3 RF  Future visit scheduled: yes  Notes to clinic:  over due lab work    Requested Prescriptions  Pending Prescriptions Disp Refills   lisinopril  (ZESTRIL ) 10 MG tablet [Pharmacy Med Name: LISINOPRIL  10 MG TABLET] 90 tablet     Sig: TAKE 1 TABLET BY MOUTH EVERY DAY     Cardiovascular:  ACE Inhibitors Failed - 08/08/2023 10:44 AM      Failed - Cr in normal range and within 180 days    Creatinine, Ser  Date Value Ref Range Status  12/10/2022 1.09 0.76 - 1.27 mg/dL Final         Failed - K in normal range and within 180 days    Potassium  Date Value Ref Range Status  12/10/2022 5.3 (H) 3.5 - 5.2 mmol/L Final         Passed - Patient is not pregnant      Passed - Last BP in normal range    BP Readings from Last 1 Encounters:  05/19/23 124/68         Passed - Valid encounter within last 6 months    Recent Outpatient Visits           2 months ago Degeneration of intervertebral disc of lumbar region with lower extremity pain   [place] [facility] at [facility], [place], MD   3 months ago Essential (primary) hypertension   [place] [facility] at [facility], [place]ales [place], MD       Future Appointments             In 4 months Gala Jubilee [place], MD [facility] at [facility]             clopidogrel  (PLAVIX ) 75 MG tablet [Pharmacy Med Name: CLOPIDOGREL  75 MG TABLET] 90 tablet     Sig: TAKE 1 TABLET BY MOUTH EVERY DAY     Hematology: Antiplatelets - clopidogrel  Failed - 08/08/2023 10:44 AM      Failed - HCT in normal range and within 180 days    Hematocrit  Date Value Ref Range Status  12/10/2022 53.6 (H) 37.5 - 51.0 % Final         Failed - HGB  in normal range and within 180 days    Hemoglobin  Date Value Ref Range Status  12/10/2022 17.9 (H) 13.0 - 17.7 g/dL Final         Failed - PLT in normal range and within 180 days    Platelets  Date Value Ref Range Status  12/10/2022 167 150 - 450 x10E3/uL Final         Passed - Cr in normal range and within 360 days    Creatinine, Ser  Date Value Ref Range Status  12/10/2022 1.09 0.76 - 1.27 mg/dL Final         Passed - Valid encounter within last 6 months    Recent Outpatient Visits           2 months ago Degeneration of intervertebral disc of lumbar region with lower extremity pain   [place] [facility] at [facility], [place], MD   3 months ago Essential (primary) hypertension  [facility] at [facility], [place], MD       Future Appointments             In 4 months Gala Jubilee [place], MD [facility] Medicine at [facility], [facility]            Signed Prescriptions Disp Refills   Choline Fenofibrate  (FENOFIBRIC ACID ) 135 MG CPDR 90 capsule 0    Sig: TAKE 1 CAPSULE BY MOUTH EVERY DAY     Cardiovascular:  Antilipid - Fibric Acid Derivatives Failed - 08/08/2023 10:44 AM      Failed - HGB in normal range and within 360 days    Hemoglobin  Date Value Ref Range Status  12/10/2022 17.9 (H) 13.0 - 17.7 g/dL Final         Failed - HCT in normal range and within 360 days    Hematocrit  Date Value Ref Range Status  12/10/2022 53.6 (H) 37.5 - 51.0 % Final         Failed - Lipid Panel in normal range within the last 12 months    Cholesterol, Total  Date Value Ref Range Status  12/10/2022 73 (L) 100 - 199 mg/dL Final   LDL Chol Calc (NIH)  Date Value Ref Range Status  12/10/2022 20 0 - 99 mg/dL Final   HDL  Date Value Ref Range Status  12/10/2022 38 (L) >39 mg/dL Final   Triglycerides  Date Value Ref Range Status  12/10/2022 67 0 - 149 mg/dL Final          Passed - ALT in normal range and within 360 days    ALT  Date Value Ref Range Status  12/10/2022 38 0 - 44 IU/L Final         Passed - AST in normal range and within 360 days    AST  Date Value Ref Range Status  12/10/2022 27 0 - 40 IU/L Final         Passed - Cr in normal range and within 360 days    Creatinine, Ser  Date Value Ref Range Status  12/10/2022 1.09 0.76 - 1.27 mg/dL Final         Passed - PLT in normal range and within 360 days    Platelets  Date Value Ref Range Status  12/10/2022 167 150 - 450 x10E3/uL Final         Passed - WBC in normal range and within 360 days    WBC  Date Value Ref Range Status  12/10/2022 6.1 3.4 - 10.8 x10E3/uL Final         Passed - eGFR is 30 or above and within 360 days    GFR calc Af Amer  Date Value Ref Range Status  02/29/2020 82 >59 mL/min/1.73 Final    Comment:    **In accordance with recommendations from the NKF-ASN Task force,**   [facility] is in the process of updating its eGFR calculation to the   2021 CKD-EPI creatinine equation that estimates kidney function   without a race variable.    GFR calc non Af Amer  Date Value Ref Range Status  06/20/2020 75  Final   eGFR  Date Value Ref Range Status  12/10/2022 74 >59 mL/min/1.73 Final         Passed - Valid encounter within last 12 months    Recent Outpatient Visits  2 months ago Degeneration of intervertebral disc of lumbar region with lower extremity pain   [place] [facility] at [facility], [place], MD   3 months ago Essential (primary) hypertension   [place] [facility] at [facility], [place], MD       Future Appointments             In 4 months Gala Jubilee [place], MD [facility] at [facility], [facility]             amLODipine  (NORVASC ) 5 MG tablet 90 tablet 0    Sig: TAKE 1 TABLET (5 MG TOTAL) BY MOUTH DAILY.      Cardiovascular: Calcium  Channel Blockers 2 Passed - 08/08/2023 10:44 AM      Passed - Last BP in normal range    BP Readings from Last 1 Encounters:  05/19/23 124/68         Passed - Last Heart Rate in normal range    Pulse Readings from Last 1 Encounters:  05/19/23 81         Passed - Valid encounter within last 6 months    Recent Outpatient Visits           2 months ago Degeneration of intervertebral disc of lumbar region with lower extremity pain   [place] [facility] at [facility], [place], MD   3 months ago Essential (primary) hypertension   [place] [facility] at [facility], [place], MD       Future Appointments             In 4 months Sheron Dixons, MD [facility] Medicine at [facility], [facility]             metoprolol  succinate (TOPROL -XL) 100 MG 24 hr tablet 90 tablet 0    Sig: TAKE 1 TABLET BY MOUTH EVERY DAY WITH OR IMMEDIATELY FOLLOWING A MEAL     Cardiovascular:  Beta Blockers Passed - 08/08/2023 10:44 AM      Passed - Last BP in normal range    BP Readings from Last 1 Encounters:  05/19/23 124/68         Passed - Last Heart Rate in normal range    Pulse Readings from Last 1 Encounters:  05/19/23 81         Passed - Valid encounter within last 6 months    Recent Outpatient Visits           2 months ago Degeneration of intervertebral disc of lumbar region with lower extremity pain   [place] [facility] at [facility], [place], MD   3 months ago Essential (primary) hypertension    Primary Care [facility] Sports Medicine at [facility], [place], MD       Future Appointments             In 4 months Gala Jubilee [place], MD [facility] Medicine at [facility], [facility]             atorvastatin  (LIPITOR) 40 MG tablet 90 tablet 0    Sig: TAKE 1 TABLET BY MOUTH EVERY DAY      Cardiovascular:  Antilipid - Statins Failed - 08/08/2023 10:44 AM      Failed - Lipid Panel  in normal range within the last 12 months    Cholesterol, Total  Date Value Ref Range Status  12/10/2022 73 (L) 100 - 199 mg/dL Final   LDL Chol Calc (NIH)  Date Value Ref Range Status  12/10/2022 20 0 - 99 mg/dL Final   HDL  Date Value Ref Range Status  12/10/2022 38 (L) >39 mg/dL Final   Triglycerides  Date Value Ref Range Status  12/10/2022 67 0 - 149 mg/dL Final         Passed - Patient is not pregnant      Passed - Valid encounter within last 12 months    Recent Outpatient Visits           2 months ago Degeneration of intervertebral disc of lumbar region with lower extremity pain   [place] [facility] at [facility], [place], MD   3 months ago Essential (primary) hypertension   [place] [facility] at [facility], [place], MD       Future Appointments             In 4 months Gala Jubilee [place], MD [facility] at [facility], [facility]             metFORMIN  (GLUCOPHAGE -XR) 500 MG 24 hr tablet 90 tablet 0    Sig: TAKE 1 TABLET BY MOUTH EVERY DAY WITH BREAKFAST     Endocrinology:  Diabetes - Biguanides Failed - 08/08/2023 10:44 AM      Failed - B12 Level in normal range and within 720 days    Vitamin B-12  Date Value Ref Range Status  12/02/2020 953 232 - 1,245 pg/mL Final         Passed - Cr in normal range and within 360 days    Creatinine, Ser  Date Value Ref Range Status  12/10/2022 1.09 0.76 - 1.27 mg/dL Final         Passed - HBA1C is between 0 and 7.9 and within 180 days    Hemoglobin A1C  Date Value Ref Range Status  04/29/2023 6.5 (A) 4.0 - 5.6 % Final   Hgb A1c MFr Bld  Date Value Ref Range Status  12/10/2022 6.5 (H) 4.8 - 5.6 % Final    Comment:             Prediabetes: 5.7 - 6.4          Diabetes: >6.4          Glycemic control for  adults with diabetes: <7.0          Passed - eGFR in normal range and within 360 days    GFR calc Af Amer  Date Value Ref Range Status  02/29/2020 82 >59 mL/min/1.73 Final    Comment:    **In accordance with recommendations from the NKF-ASN Task force,**   [facility] is in the process of updating its eGFR calculation to the   2021 CKD-EPI creatinine equation that estimates kidney function   without a race variable.    GFR calc non Af Amer  Date Value Ref Range Status  06/20/2020 75  Final   eGFR  Date Value Ref Range Status  12/10/2022 74 >59 mL/min/1.73 Final         Passed - Valid encounter within last 6 months    Recent Outpatient Visits           2 months ago Degeneration  of intervertebral disc of lumbar region with lower extremity pain   [place] [facility] at [facility], [place], MD   3 months ago Essential (primary) hypertension   [place] [facility] at [facility], [place], MD       Future Appointments             In 4 months Gala Jubilee [place], MD [facility] Primary Care & Sports Medicine at [facility], [facility]            Passed - CBC within normal limits and completed in the last 12 months    WBC  Date Value Ref Range Status  12/10/2022 6.1 3.4 - 10.8 x10E3/uL Final   RBC  Date Value Ref Range Status  12/10/2022 5.96 (H) 4.14 - 5.80 x10E6/uL Final  10/09/2014 100 (A) 4.69 - 6.13 M/uL Preliminary   Hemoglobin  Date Value Ref Range Status  12/10/2022 17.9 (H) 13.0 - 17.7 g/dL Final   Hematocrit  Date Value Ref Range Status  12/10/2022 53.6 (H) 37.5 - 51.0 % Final   MCHC  Date Value Ref Range Status  12/10/2022 33.4 31.5 - 35.7 g/dL Final   [facility]  Date Value Ref Range Status  12/10/2022 30.0 26.6 - 33.0 pg Final   MCV  Date Value Ref Range Status  12/10/2022 90 79 - 97 fL Final   No results found for: "PLTCOUNTKUC", "LABPLAT", "POCPLA" RDW  Date Value Ref Range  Status  12/10/2022 13.8 11.6 - 15.4 % Final

## 2023-08-08 NOTE — Telephone Encounter (Signed)
 Please review med refill request.

## 2023-08-08 NOTE — Telephone Encounter (Signed)
 Requested Prescriptions  Pending Prescriptions Disp Refills   Choline Fenofibrate  (FENOFIBRIC ACID ) 135 MG CPDR [Pharmacy Med Name: FENOFIBRIC ACID  DR 135 MG CAP] 90 capsule 0    Sig: TAKE 1 CAPSULE BY MOUTH EVERY DAY     Cardiovascular:  Antilipid - Fibric Acid Derivatives Failed - 08/08/2023 10:42 AM      Failed - HGB in normal range and within 360 days    Hemoglobin  Date Value Ref Range Status  12/10/2022 17.9 (H) 13.0 - 17.7 g/dL Final         Failed - HCT in normal range and within 360 days    Hematocrit  Date Value Ref Range Status  12/10/2022 53.6 (H) 37.5 - 51.0 % Final         Failed - Lipid Panel in normal range within the last 12 months    Cholesterol, Total  Date Value Ref Range Status  12/10/2022 73 (L) 100 - 199 mg/dL Final   LDL Chol Calc (NIH)  Date Value Ref Range Status  12/10/2022 20 0 - 99 mg/dL Final   HDL  Date Value Ref Range Status  12/10/2022 38 (L) >39 mg/dL Final   Triglycerides  Date Value Ref Range Status  12/10/2022 67 0 - 149 mg/dL Final         Passed - ALT in normal range and within 360 days    ALT  Date Value Ref Range Status  12/10/2022 38 0 - 44 IU/L Final         Passed - AST in normal range and within 360 days    AST  Date Value Ref Range Status  12/10/2022 27 0 - 40 IU/L Final         Passed - Cr in normal range and within 360 days    Creatinine, Ser  Date Value Ref Range Status  12/10/2022 1.09 0.76 - 1.27 mg/dL Final         Passed - PLT in normal range and within 360 days    Platelets  Date Value Ref Range Status  12/10/2022 167 150 - 450 x10E3/uL Final         Passed - WBC in normal range and within 360 days    WBC  Date Value Ref Range Status  12/10/2022 6.1 3.4 - 10.8 x10E3/uL Final         Passed - eGFR is 30 or above and within 360 days    GFR calc Af Amer  Date Value Ref Range Status  02/29/2020 82 >59 mL/min/1.73 Final    Comment:    **In accordance with recommendations from the NKF-ASN Task  force,**   [facility] is in the process of updating its eGFR calculation to the   2021 CKD-EPI creatinine equation that estimates kidney function   without a race variable.    GFR calc non Af Amer  Date Value Ref Range Status  06/20/2020 75  Final   eGFR  Date Value Ref Range Status  12/10/2022 74 >59 mL/min/1.73 Final         Passed - Valid encounter within last 12 months    Recent Outpatient Visits           2 months ago Degeneration of intervertebral disc of lumbar region with lower extremity pain   [place] [facility] at [facility], [place], MD   3 months ago Essential (primary) hypertension   [place] [facility] Medicine at [facility]  [place], Laura H, MD       Future Appointments             In 4 months Gala Jubilee [place], MD [facility] at [facility], [facility]             lisinopril  (ZESTRIL ) 10 MG tablet [Pharmacy Med Name: LISINOPRIL  10 MG TABLET] 90 tablet     Sig: TAKE 1 TABLET BY MOUTH EVERY DAY     Cardiovascular:  ACE Inhibitors Failed - 08/08/2023 10:42 AM      Failed - Cr in normal range and within 180 days    Creatinine, Ser  Date Value Ref Range Status  12/10/2022 1.09 0.76 - 1.27 mg/dL Final         Failed - K in normal range and within 180 days    Potassium  Date Value Ref Range Status  12/10/2022 5.3 (H) 3.5 - 5.2 mmol/L Final         Passed - Patient is not pregnant      Passed - Last BP in normal range    BP Readings from Last 1 Encounters:  05/19/23 124/68         Passed - Valid encounter within last 6 months    Recent Outpatient Visits           2 months ago Degeneration of intervertebral disc of lumbar region with lower extremity pain   [place] [facility] at [facility], Chales [place], MD   3 months ago Essential (primary) hypertension   [place] [facility] Medicine at [facility],  [place], MD       Future Appointments             In 4 months Gala Jubilee [place], MD [facility] Medicine at [facility], [facility]             amLODipine  (NORVASC ) 5 MG tablet [Pharmacy Med Name: AMLODIPINE  BESYLATE 5 MG TAB] 90 tablet 0    Sig: TAKE 1 TABLET (5 MG TOTAL) BY MOUTH DAILY.     Cardiovascular: Calcium  Channel Blockers 2 Passed - 08/08/2023 10:42 AM      Passed - Last BP in normal range    BP Readings from Last 1 Encounters:  05/19/23 124/68         Passed - Last Heart Rate in normal range    Pulse Readings from Last 1 Encounters:  05/19/23 81         Passed - Valid encounter within last 6 months    Recent Outpatient Visits           2 months ago Degeneration of intervertebral disc of lumbar region with lower extremity pain   [place] [facility] at [facility], [place], MD   3 months ago Essential (primary) hypertension   [place] [facility] at [facility], [place], MD       Future Appointments             In 4 months Gala Jubilee [place], MD [facility] at [facility], [facility]             metoprolol  succinate (TOPROL -XL) 100 MG 24 hr tablet [Pharmacy Med Name: METOPROLOL  SUCC ER 100 MG TAB] 90 tablet 0    Sig: TAKE 1 TABLET BY MOUTH  EVERY DAY WITH OR IMMEDIATELY FOLLOWING A MEAL     Cardiovascular:  Beta Blockers Passed - 08/08/2023 10:42 AM      Passed - Last BP in normal range    BP Readings from Last 1 Encounters:  05/19/23 124/68         Passed - Last Heart Rate in normal range    Pulse Readings from Last 1 Encounters:  05/19/23 81         Passed - Valid encounter within last 6 months    Recent Outpatient Visits           2 months ago Degeneration of intervertebral disc of lumbar region with lower extremity pain   [place] [facility] at [facility], [place], MD   3 months  ago Essential (primary) hypertension   [place] [facility] Medicine at [facility], [place], MD       Future Appointments             In 4 months Sheron Dixons, MD [facility] Primary Care [facility] Medicine at [facility], [facility]             atorvastatin  (LIPITOR) 40 MG tablet [Pharmacy Med Name: ATORVASTATIN  40 MG TABLET] 90 tablet 0    Sig: TAKE 1 TABLET BY MOUTH EVERY DAY     Cardiovascular:  Antilipid - Statins Failed - 08/08/2023 10:42 AM      Failed - Lipid Panel in normal range within the last 12 months    Cholesterol, Total  Date Value Ref Range Status  12/10/2022 73 (L) 100 - 199 mg/dL Final   LDL Chol Calc (NIH)  Date Value Ref Range Status  12/10/2022 20 0 - 99 mg/dL Final   HDL  Date Value Ref Range Status  12/10/2022 38 (L) >39 mg/dL Final   Triglycerides  Date Value Ref Range Status  12/10/2022 67 0 - 149 mg/dL Final         Passed - Patient is not pregnant      Passed - Valid encounter within last 12 months    Recent Outpatient Visits           2 months ago Degeneration of intervertebral disc of lumbar region with lower extremity pain   [place] [facility] at [facility], [place], MD   3 months ago Essential (primary) hypertension    Primary Care & Sports Medicine at [facility], [place], MD       Future Appointments             In 4 months Gala Jubilee [place], MD [facility] Care & [facility] Medicine at [facility], [facility]             metFORMIN  (GLUCOPHAGE -XR) 500 MG 24 hr tablet [Pharmacy Med Name: METFORMIN  HCL ER 500 MG TABLET] 90 tablet 0    Sig: TAKE 1 TABLET BY MOUTH EVERY DAY WITH BREAKFAST     Endocrinology:  Diabetes - Biguanides Failed - 08/08/2023 10:42 AM      Failed - B12 Level in normal range and within 720 days    Vitamin B-12  Date Value Ref Range Status  12/02/2020 953 232 - 1,245 pg/mL Final         Passed - Cr in  normal range and within 360 days    Creatinine, Ser  Date Value Ref Range Status  12/10/2022  1.09 0.76 - 1.27 mg/dL Final         Passed - HBA1C is between 0 and 7.9 and within 180 days    Hemoglobin A1C  Date Value Ref Range Status  04/29/2023 6.5 (A) 4.0 - 5.6 % Final   Hgb A1c MFr Bld  Date Value Ref Range Status  12/10/2022 6.5 (H) 4.8 - 5.6 % Final    Comment:             Prediabetes: 5.7 - 6.4          Diabetes: >6.4          Glycemic control for adults with diabetes: <7.0          Passed - eGFR in normal range and within 360 days    GFR calc Af Amer  Date Value Ref Range Status  02/29/2020 82 >59 mL/min/1.73 Final    Comment:    **In accordance with recommendations from the NKF-ASN Task force,**   [facility] is in the process of updating its eGFR calculation to the   2021 CKD-EPI creatinine equation that estimates kidney function   without a race variable.    GFR calc non Af Amer  Date Value Ref Range Status  06/20/2020 75  Final   eGFR  Date Value Ref Range Status  12/10/2022 74 >59 mL/min/1.73 Final         Passed - Valid encounter within last 6 months    Recent Outpatient Visits           2 months ago Degeneration of intervertebral disc of lumbar region with lower extremity pain   [place] [facility] at [facility], [place], MD   3 months ago Essential (primary) hypertension   [place] [facility] at [facility], Ch[place], MD       Future Appointments             In 4 months Gala Jubilee [place], MD [facility] Primary Care & Sports Medicine at [facility], [facility]            Passed - CBC within normal limits and completed in the last 12 months    WBC  Date Value Ref Range Status  12/10/2022 6.1 3.4 - 10.8 x10E3/uL Final   RBC  Date Value Ref Range Status  12/10/2022 5.96 (H) 4.14 - 5.80 x10E6/uL Final  10/09/2014 100 (A) 4.69 - 6.13 M/uL Preliminary   Hemoglobin  Date  Value Ref Range Status  12/10/2022 17.9 (H) 13.0 - 17.7 g/dL Final   Hematocrit  Date Value Ref Range Status  12/10/2022 53.6 (H) 37.5 - 51.0 % Final   MCHC  Date Value Ref Range Status  12/10/2022 33.4 31.5 - 35.7 g/dL Final   [facility]  Date Value Ref Range Status  12/10/2022 30.0 26.6 - 33.0 pg Final   MCV  Date Value Ref Range Status  12/10/2022 90 79 - 97 fL Final   No results found for: "PLTCOUNTKUC", "LABPLAT", "POCPLA" RDW  Date Value Ref Range Status  12/10/2022 13.8 11.6 - 15.4 % Final          clopidogrel  (PLAVIX ) 75 MG tablet [Pharmacy Med Name: CLOPIDOGREL  75 MG TABLET] 90 tablet     Sig: TAKE 1 TABLET BY MOUTH EVERY DAY     Hematology: Antiplatelets - clopidogrel  Failed - 08/08/2023 10:42 AM      Failed - HCT in normal range and within 180 days  Hematocrit  Date Value Ref Range Status  12/10/2022 53.6 (H) 37.5 - 51.0 % Final         Failed - HGB in normal range and within 180 days    Hemoglobin  Date Value Ref Range Status  12/10/2022 17.9 (H) 13.0 - 17.7 g/dL Final         Failed - PLT in normal range and within 180 days    Platelets  Date Value Ref Range Status  12/10/2022 167 150 - 450 x10E3/uL Final         Passed - Cr in normal range and within 360 days    Creatinine, Ser  Date Value Ref Range Status  12/10/2022 1.09 0.76 - 1.27 mg/dL Final         Passed - Valid encounter within last 6 months    Recent Outpatient Visits           2 months ago Degeneration of intervertebral disc of lumbar region with lower extremity pain   [place] [facility] at [facility], [place], MD   3 months ago Essential (primary) hypertension   [place] [facility] Medicine at [facility], [place], MD       Future Appointments             In 4 months Gala Jubilee, [place], MD [facility] Care [facility] Medicine at [facility], [facility]

## 2023-08-09 ENCOUNTER — Encounter: Payer: Self-pay | Admitting: Internal Medicine

## 2023-08-09 ENCOUNTER — Ambulatory Visit (INDEPENDENT_AMBULATORY_CARE_PROVIDER_SITE_OTHER): Payer: Self-pay | Admitting: Internal Medicine

## 2023-08-09 VITALS — BP 134/68 | HR 69 | Ht 71.0 in | Wt 253.1 lb

## 2023-08-09 DIAGNOSIS — E118 Type 2 diabetes mellitus with unspecified complications: Secondary | ICD-10-CM

## 2023-08-09 DIAGNOSIS — I10 Essential (primary) hypertension: Secondary | ICD-10-CM | POA: Diagnosis not present

## 2023-08-09 DIAGNOSIS — N529 Male erectile dysfunction, unspecified: Secondary | ICD-10-CM

## 2023-08-09 DIAGNOSIS — Z7984 Long term (current) use of oral hypoglycemic drugs: Secondary | ICD-10-CM

## 2023-08-09 MED ORDER — TIRZEPATIDE 7.5 MG/0.5ML ~~LOC~~ SOAJ: 7.5000 mg | SUBCUTANEOUS | 0 refills | Status: DC

## 2023-08-09 NOTE — Assessment & Plan Note (Signed)
 Blood sugars have been stable.  No recent hypoglycemic events requiring assistance. Currently medications are Jardiance , MTF and Ozempic .  He is having more nausea an gagging and would like to try Mounjaro instead. Lab Results  Component Value Date   HGBA1C 6.5 (A) 04/29/2023   Last visit no changes were made.

## 2023-08-09 NOTE — Assessment & Plan Note (Signed)
 Blood pressure is well controlled.  Current medications metoprolol , lisinopril  and amlodipine   Will continue same regimen along with efforts to limit dietary sodium.

## 2023-08-09 NOTE — Assessment & Plan Note (Signed)
 Recommend trial of a whole Cialis  instead of half

## 2023-08-09 NOTE — Progress Notes (Signed)
 Date:  08/09/2023   Name:  Wyatt Mata   DOB:  May 31, 1955   MRN:  [id number]   Chief Complaint: Hypertension, Diabetes, and Medication Consultation (Patient would like to change ozempic  to mounjaro)  Hypertension This is a chronic problem. The problem is controlled. Pertinent negatives include no chest pain, headaches, palpitations or shortness of breath. Past treatments include ACE inhibitors, calcium  channel blockers and beta blockers. The current treatment provides significant improvement. Hypertensive end-organ damage includes CAD/MI. There is no history of kidney disease or CVA.  Diabetes He presents for his follow-up diabetic visit. He has type 2 diabetes mellitus. Pertinent negatives for hypoglycemia include no dizziness or headaches. Pertinent negatives for diabetes include no chest pain, no fatigue and no weakness. Pertinent negatives for diabetic complications include no CVA. Compliance with diabetes treatment: MTF, Jardiance , Ozempic .    Review of Systems  Constitutional:  Negative for fatigue and unexpected weight change.  HENT:  Negative for nosebleeds.   Eyes:  Negative for visual disturbance.  Respiratory:  Negative for cough, chest tightness, shortness of breath and wheezing.   Cardiovascular:  Negative for chest pain, palpitations and leg swelling.  Gastrointestinal:  Negative for abdominal pain, constipation and diarrhea.  Genitourinary:        ED - long standing  Musculoskeletal:  Positive for back pain.  Neurological:  Negative for dizziness, weakness, light-headedness and headaches.     Lab Results  Component Value Date   NA 141 12/10/2022   K 5.3 (H) 12/10/2022   CO2 22 12/10/2022   GLUCOSE 96 12/10/2022   BUN 23 12/10/2022   CREATININE 1.09 12/10/2022   CALCIUM  9.9 12/10/2022   EGFR 74 12/10/2022   GFRNONAA 75 06/20/2020   Lab Results  Component Value Date   CHOL 73 (L) 12/10/2022   HDL 38 (L) 12/10/2022   LDLCALC 20 12/10/2022   TRIG 67  12/10/2022   CHOLHDL 1.9 12/10/2022   Lab Results  Component Value Date   TSH 1.350 12/10/2022   Lab Results  Component Value Date   HGBA1C 6.5 (A) 04/29/2023   Lab Results  Component Value Date   WBC 6.1 12/10/2022   HGB 17.9 (H) 12/10/2022   HCT 53.6 (H) 12/10/2022   MCV 90 12/10/2022   PLT 167 12/10/2022   Lab Results  Component Value Date   ALT 38 12/10/2022   AST 27 12/10/2022   ALKPHOS 49 12/10/2022   BILITOT 0.7 12/10/2022   No results found for: "25OHVITD2", "25OHVITD3", "VD25OH"   Patient Active Problem List   Diagnosis Date Noted   Obesity, morbid (HCC) 04/29/2023   Gait disturbance 04/29/2023   Osteoarthritis of left knee 09/10/2021   BMI 36.0-36.9,adult 01/02/2021   Neck pain 02/29/2020   Coronary artery disease involving coronary bypass graft of native heart with angina pectoris (HCC) 10/13/2018   AAA (abdominal aortic aneurysm) (HCC) 02/12/2018   Spondylolisthesis of lumbar region 11/30/2017   Lumbar radiculopathy 11/11/2017   DDD (degenerative disc disease), lumbar 10/10/2017   Type II diabetes mellitus with complication (HCC) 10/10/2015   Low serum testosterone  10/10/2015   Hematuria, gross 10/15/2014   Allergic rhinitis 08/05/2014   Hyperlipidemia associated with type 2 diabetes mellitus (HCC) 08/05/2014   Epididymal pain 08/05/2014   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   Essential (primary) hypertension 08/05/2014   Acid reflux 08/05/2014   History of colon polyps 08/05/2014   Obstructive apnea 08/05/2014   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  Allergies  Allergen Reactions   Topiramate Other (See Comments)    Severe headache   Codeine Nausea Only   Cyclobenzaprine  Other (See Comments)    Abdominal pain    Past Surgical History:  Procedure Laterality Date   COLONOSCOPY  08/16/2014   tubular adenoma   COLONOSCOPY  08/24/2017   one polyp - repeat 5 yrs   CORONARY  ANGIOPLASTY N/A 07/2020   CORONARY ANGIOPLASTY WITH STENT PLACEMENT  2013   CORONARY ANGIOPLASTY WITH STENT PLACEMENT  01/2015   2 stents  - post circ and OM2   CORONARY ARTERY BYPASS GRAFT  2004    Social History   Tobacco Use   Smoking status: Former   Smokeless tobacco: Never  Vaping Use   Vaping status: Never Used  Substance Use Topics   Alcohol use: No    Alcohol/week: 0.0 standard drinks of alcohol   Drug use: No     Medication list has been reviewed and updated.  Current Meds  Medication Sig   albuterol  (VENTOLIN  HFA) 108 (90 Base) MCG/ACT inhaler TAKE 2 PUFFS BY MOUTH EVERY 6 HOURS AS NEEDED FOR WHEEZE OR SHORTNESS OF BREATH   amLODipine  (NORVASC ) 5 MG tablet TAKE 1 TABLET (5 MG TOTAL) BY MOUTH DAILY.   Aspirin-Acetaminophen-Caffeine 500-325-65 MG PACK Take 1 packet by mouth 2 (two) times daily. Good[facility]   atorvastatin  (LIPITOR) 40 MG tablet TAKE 1 TABLET BY MOUTH EVERY DAY   [place] Fenofibrate  (FENOFIBRIC ACID ) 135 MG CPDR TAKE 1 CAPSULE BY MOUTH EVERY DAY   clopidogrel  (PLAVIX ) 75 MG tablet TAKE 1 TABLET BY MOUTH EVERY DAY   gabapentin  (NEURONTIN ) 300 MG capsule TAKE 1 CAPSULE BY MOUTH FOUR TIMES A DAY   isosorbide mononitrate (IMDUR) 30 MG 24 hr tablet Take 30 mg by mouth daily.   JARDIANCE  25 MG TABS tablet TAKE 1 TABLET (25 MG TOTAL) BY MOUTH DAILY.   lisinopril  (ZESTRIL ) 10 MG tablet TAKE 1 TABLET BY MOUTH EVERY DAY   metFORMIN  (GLUCOPHAGE -XR) 500 MG 24 hr tablet TAKE 1 TABLET BY MOUTH EVERY DAY WITH BREAKFAST   metoprolol  succinate (TOPROL -XL) 100 MG 24 hr tablet TAKE 1 TABLET BY MOUTH EVERY DAY WITH OR IMMEDIATELY FOLLOWING A MEAL   REPATHA SURECLICK 140 MG/ML SOAJ Inject into the skin every 14 (fourteen) days.   tadalafil  (CIALIS ) 20 MG tablet TAKE 1/2 TO 1 TABLET (10-20 MG TOTAL) BY MOUTH EVERY OTHER DAY AS NEEDED FOR ERECTILE DYSFUNCTION   tirzepatide (MOUNJARO) 7.5 MG/0.5ML Pen Inject 7.5 mg into the skin once a week.   [DISCONTINUED] Semaglutide , 2 MG/DOSE,  (OZEMPIC , 2 MG/DOSE,) 8 MG/3ML SOPN INJECT 2 MG AS DIRECTED ONCE A WEEK.       08/09/2023    8:52 AM 04/29/2023   11:15 AM 04/08/2023    2:38 PM 12/10/2022   10:23 AM  GAD 7 : Generalized Anxiety Score  Nervous, Anxious, on Edge 0 0 0 0  Control/stop worrying 0 0 0 0  Worry too much - different things 0 0 0 0  Trouble relaxing 0 0 0 0  Restless 0 0 0 0  Easily annoyed or irritable 0 0 0 0  Afraid - awful might happen 0 0 0 0  Total GAD 7 Score 0 0 0 0  Anxiety Difficulty Not difficult at all Not difficult at all Not difficult at all Not difficult at all       08/09/2023    8:52 AM 04/29/2023   11:15 AM 04/08/2023    2:38  PM  Depression screen PHQ 2/9  Decreased Interest 0 0 0  Down, Depressed, Hopeless 0 0 0  PHQ - 2 Score 0 0 0  Altered sleeping 0  0  Tired, decreased energy 0  0  Change in appetite 0  0  Feeling bad or failure about yourself  0  0  Trouble concentrating 0  0  Moving slowly or fidgety/restless 0  0  Suicidal thoughts 0  0  PHQ-9 Score 0  0  Difficult doing work/chores Not difficult at all  Not difficult at all    BP Readings from Last 3 Encounters:  08/09/23 134/68  05/19/23 124/68  04/29/23 124/78    Physical Exam Vitals and nursing note reviewed.  Constitutional:      General: He is not in acute distress.    Appearance: Normal appearance. He is well-developed.  HENT:     Head: Normocephalic and atraumatic.  Neck:     Vascular: No carotid bruit.  Cardiovascular:     Rate and Rhythm: Normal rate and regular rhythm.     Heart sounds: No murmur heard. Pulmonary:     Effort: Pulmonary effort is normal. No respiratory distress.     Breath sounds: No wheezing or rhonchi.  Musculoskeletal:     Cervical back: Normal range of motion.     Right lower leg: No edema.     Left lower leg: No edema.  Lymphadenopathy:     Cervical: No cervical adenopathy.  Skin:    General: Skin is warm and dry.     Capillary Refill: Capillary refill takes less than 2  seconds.     Findings: No rash.  Neurological:     General: No focal deficit present.     Mental Status: He is alert and oriented to person, place, and time.  Psychiatric:        Mood and Affect: Mood normal.        Behavior: Behavior normal.     Wt Readings from Last 3 Encounters:  08/09/23 253 lb 2 oz (114.8 kg)  05/19/23 255 lb 8 oz (115.9 kg)  04/29/23 256 lb (116.1 kg)    BP 134/68   Pulse 69   Ht 5\' 11"  (1.803 m)   Wt 253 lb 2 oz (114.8 kg)   SpO2 96%   BMI 35.30 kg/m   Assessment and Plan:  Problem List Items Addressed This Visit       Unprioritized   ED (erectile dysfunction) of organic origin   Recommend trial of a whole Cialis  instead of half      Essential (primary) hypertension - Primary (Chronic)   Blood pressure is well controlled.  Current medications metoprolol , lisinopril  and amlodipine   Will continue same regimen along with efforts to limit dietary sodium.       Type II diabetes mellitus with complication (HCC) (Chronic)   Blood sugars have been stable.  No recent hypoglycemic events requiring assistance. Currently medications are Jardiance , MTF and Ozempic .  He is having more nausea an gagging and would like to try Mounjaro instead. Lab Results  Component Value Date   HGBA1C 6.5 (A) 04/29/2023   Last visit no changes were made.       Relevant Medications   tirzepatide (MOUNJARO) 7.5 MG/0.5ML Pen   Other Relevant Orders   Hemoglobin A1c   Microalbumin / creatinine urine ratio   Other Visit Diagnoses       Long term current use of oral hypoglycemic drug  Return in about 4 months (around 12/10/2023) for CPX.    Sheron Dixons, MD [facility] Primary Care [facility] Sports Medicine [place]

## 2023-08-10 ENCOUNTER — Other Ambulatory Visit: Payer: Self-pay | Admitting: Internal Medicine

## 2023-08-10 ENCOUNTER — Ambulatory Visit: Payer: Self-pay | Admitting: Internal Medicine

## 2023-08-10 DIAGNOSIS — E118 Type 2 diabetes mellitus with unspecified complications: Secondary | ICD-10-CM

## 2023-08-10 LAB — HEMOGLOBIN A1C
Est. average glucose Bld gHb Est-mCnc: 146 mg/dL
Hgb A1c MFr Bld: 6.7 % — ABNORMAL HIGH (ref 4.8–5.6)

## 2023-08-10 LAB — MICROALBUMIN / CREATININE URINE RATIO
Creatinine, Urine: 98.4 mg/dL
Microalb/Creat Ratio: 6 mg/g{creat} (ref 0–29)
Microalbumin, Urine: 5.9 ug/mL

## 2023-08-11 NOTE — Telephone Encounter (Signed)
 Requested Prescriptions  Pending Prescriptions Disp Refills   JARDIANCE  25 MG TABS tablet [Pharmacy Med Name: JARDIANCE  25 MG TABLET] 90 tablet 1    Sig: TAKE 1 TABLET (25 MG TOTAL) BY MOUTH DAILY.     Endocrinology:  Diabetes - SGLT2 Inhibitors Passed - 08/11/2023  4:21 PM      Passed - Cr in normal range and within 360 days    Creatinine, Ser  Date Value Ref Range Status  12/10/2022 1.09 0.76 - 1.27 mg/dL Final         Passed - HBA1C is between 0 and 7.9 and within 180 days    Hgb A1c MFr Bld  Date Value Ref Range Status  08/09/2023 6.7 (H) 4.8 - 5.6 % Final    Comment:             Prediabetes: 5.7 - 6.4          Diabetes: >6.4          Glycemic control for adults with diabetes: <7.0          Passed - eGFR in normal range and within 360 days    GFR calc Af Amer  Date Value Ref Range Status  02/29/2020 82 >59 mL/min/1.73 Final    Comment:    **In accordance with recommendations from the NKF-ASN Task force,**   [facility] is in the process of updating its eGFR calculation to the   2021 CKD-EPI creatinine equation that estimates kidney function   without a race variable.    GFR calc non Af Amer  Date Value Ref Range Status  06/20/2020 75  Final   eGFR  Date Value Ref Range Status  12/10/2022 74 >59 mL/min/1.73 Final         Passed - Valid encounter within last 6 months    Recent Outpatient Visits           2 days ago Essential (primary) hypertension   [place] [facility] at [facility], Ch[place], MD   2 months ago Degeneration of intervertebral disc of lumbar region with lower extremity pain   [place] [facility] Medicine at [facility], Chales [place], MD   3 months ago Essential (primary) hypertension   [place] Primary Care & Sports Medicine at [facility], Chales [place], MD       Future Appointments             In 4 months Gala Jubilee, [place], MD [facility] Primary Care & Sports Medicine at  [facility], [facility]

## 2023-08-12 ENCOUNTER — Ambulatory Visit: Payer: 59 | Admitting: Internal Medicine

## 2023-08-17 ENCOUNTER — Ambulatory Visit: Admitting: Internal Medicine

## 2023-08-25 ENCOUNTER — Ambulatory Visit: Payer: Self-pay

## 2023-08-25 NOTE — Telephone Encounter (Signed)
 FYI Only or Action Required?: FYI only for provider  Patient was last seen in primary care on 08/09/2023 by Sheron Dixons, MD. Called Nurse Triage reporting Back Pain. Symptoms began several months ago. Interventions attempted: OTC medications: goody's powder. Symptoms are: gradually worsening.  Triage Disposition: See PCP When Office is Open (Within 3 Days)  Patient/caregiver understands and will follow disposition?: Yes      Copied from CRM [id number]. Topic: Clinical - Red Word Triage >> Aug 25, 2023  1:32 PM Antwanette L wrote: Red Word that prompted transfer to Nurse Triage:  Patient is experiencing severe pain in back down to his knee. Patient wants to know if provider can prescribe prednisone . Reason for Disposition  [1] Pain radiates into the thigh or further down the leg AND [2] one leg  Answer Assessment - Initial Assessment Questions 1. ONSET: "When did the pain begin?"      A couple of months but has gotten worse 2. LOCATION: "Where does it hurt?" (upper, mid or lower back)     Lower back  3. SEVERITY: "How bad is the pain?"  (e.g., Scale 1-10; mild, moderate, or severe)   - MILD (1-3): Doesn't interfere with normal activities.    - MODERATE (4-7): Interferes with normal activities or awakens from sleep.    - SEVERE (8-10): Excruciating pain, unable to do any normal activities.      10/10 when walking but a constant 6/10 4. PATTERN: "Is the pain constant?" (e.g., yes, no; constant, intermittent)      Constant but then becomes sharp 5. RADIATION: "Does the pain shoot into your legs or somewhere else?"     Down to left knee 6. CAUSE:  "What do you think is causing the back pain?"      Pinched nerve 7. BACK OVERUSE:  "Any recent lifting of heavy objects, strenuous work or exercise?"     no 8. MEDICINES: "What have you taken so far for the pain?" (e.g., nothing, acetaminophen, NSAIDS)     goody's 9. NEUROLOGIC SYMPTOMS: "Do you have any weakness, numbness, or problems  with bowel/bladder control?"     no 10. OTHER SYMPTOMS: "Do you have any other symptoms?" (e.g., fever, abdomen pain, burning with urination, blood in urine)       no  Protocols used: Back Pain-A-AH

## 2023-08-26 ENCOUNTER — Ambulatory Visit: Payer: 59 | Admitting: Internal Medicine

## 2023-08-26 ENCOUNTER — Ambulatory Visit (INDEPENDENT_AMBULATORY_CARE_PROVIDER_SITE_OTHER): Admitting: Internal Medicine

## 2023-08-26 ENCOUNTER — Encounter: Payer: Self-pay | Admitting: Internal Medicine

## 2023-08-26 VITALS — BP 124/72 | HR 81 | Ht 71.0 in | Wt 248.0 lb

## 2023-08-26 DIAGNOSIS — I10 Essential (primary) hypertension: Secondary | ICD-10-CM

## 2023-08-26 DIAGNOSIS — M5416 Radiculopathy, lumbar region: Secondary | ICD-10-CM

## 2023-08-26 MED ORDER — PREDNISONE 10 MG PO TABS
ORAL_TABLET | ORAL | 0 refills | Status: AC
Start: 2023-08-26 — End: 2023-09-01
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
## 2023-08-26 NOTE — Assessment & Plan Note (Signed)
 Blood pressure is well controlled.  Current medications metoprolol , amlodipine  and lisinopril . Will continue same regimen along with efforts to limit dietary sodium.

## 2023-08-26 NOTE — Assessment & Plan Note (Signed)
 New worsening symptoms with left leg pain. He is taking gabapentin  at night which helps with sleep. [facility] won't bill his insurance for surgery Will refer to [facility] for consultation

## 2023-09-01 DIAGNOSIS — Z87891 Personal history of nicotine dependence: Secondary | ICD-10-CM | POA: Diagnosis not present

## 2023-09-01 DIAGNOSIS — G8929 Other chronic pain: Secondary | ICD-10-CM | POA: Diagnosis not present

## 2023-09-01 DIAGNOSIS — M5116 Intervertebral disc disorders with radiculopathy, lumbar region: Secondary | ICD-10-CM | POA: Diagnosis not present

## 2023-09-01 DIAGNOSIS — M5441 Lumbago with sciatica, right side: Secondary | ICD-10-CM | POA: Diagnosis not present

## 2023-09-01 DIAGNOSIS — M5442 Lumbago with sciatica, left side: Secondary | ICD-10-CM | POA: Diagnosis not present

## 2023-09-01 DIAGNOSIS — M4316 Spondylolisthesis, lumbar region: Secondary | ICD-10-CM | POA: Diagnosis not present

## 2023-09-07 ENCOUNTER — Other Ambulatory Visit: Payer: Self-pay

## 2023-09-07 MED ORDER — TIRZEPATIDE 10 MG/0.5ML ~~LOC~~ SOAJ: 10.0000 mg | SUBCUTANEOUS | 0 refills | Status: DC

## 2023-09-07 NOTE — Telephone Encounter (Unsigned)
 Copied from CRM [id number]. Topic: Clinical - Medication Refill >> Sep 07, 2023 12:47 PM Tiffini S wrote: Medication: tirzepatide  (MOUNJARO ) 7.5 MG/0.5ML Pen, patient is requesting to increase the dosage amount. He just take the last pen and need refills   Has the patient contacted their pharmacy? No  This is the patient's preferred pharmacy:  [facility] [address],  - [address] [place] [address] Phone: [phone] Fax: [phone]    Is this the correct pharmacy for this prescription? Yes If no, delete pharmacy and type the correct one.   Has the prescription been filled recently? Yes  Is the patient out of the medication? Yes, patient have six days before taking the next pen   Has the patient been seen for an appointment in the last year OR does the patient have an upcoming appointment? Yes  Can we respond through MyChart? No, patient is asking for a call back at [phone] to confirm that the medication increase in dosage has been approved by PCP  Agent: Please be advised that Rx refills may take up to 3 business days. We ask that you follow-up with your pharmacy.

## 2023-09-07 NOTE — Telephone Encounter (Signed)
 Sent in next dose, 10 mg weekly. Called pt and left VM informing of the dose being sent in.  - Wyatt Mata M.

## 2023-09-15 ENCOUNTER — Other Ambulatory Visit: Payer: Self-pay | Admitting: Internal Medicine

## 2023-09-15 DIAGNOSIS — N529 Male erectile dysfunction, unspecified: Secondary | ICD-10-CM

## 2023-09-16 NOTE — Telephone Encounter (Signed)
 Requested Prescriptions  Pending Prescriptions Disp Refills   tadalafil  (CIALIS ) 20 MG tablet [Pharmacy Med Name: TADALAFIL  20 MG TABLET] 10 tablet 0    Sig: TAKE 1/2 TO 1 TABLET (10-20 MG TOTAL) BY MOUTH EVERY OTHER DAY AS NEEDED FOR ERECTILE DYSFUNCTION     Urology: Erectile Dysfunction Agents Passed - 09/16/2023 10:25 AM      Passed - AST in normal range and within 360 days    AST  Date Value Ref Range Status  12/10/2022 27 0 - 40 IU/L Final         Passed - ALT in normal range and within 360 days    ALT  Date Value Ref Range Status  12/10/2022 38 0 - 44 IU/L Final         Passed - Last BP in normal range    BP Readings from Last 1 Encounters:  08/26/23 124/72         Passed - Valid encounter within last 12 months    Recent Outpatient Visits           3 weeks ago Essential (primary) hypertension   [place] [facility] at [facility], Leita DEL, MD   1 month ago Essential (primary) hypertension   [place] [facility] at [facility], Leita DEL, MD   4 months ago Degeneration of intervertebral disc of lumbar region with lower extremity pain   [place] [facility] at [facility], Leita DEL, MD   4 months ago Essential (primary) hypertension   [place] [facility] at [facility], Leita DEL, MD       Future Appointments             In 3 months Justus, Leita DEL, MD [facility] at [facility], [facility]

## 2023-09-19 DIAGNOSIS — M5386 Other specified dorsopathies, lumbar region: Secondary | ICD-10-CM | POA: Diagnosis not present

## 2023-09-19 DIAGNOSIS — M5116 Intervertebral disc disorders with radiculopathy, lumbar region: Secondary | ICD-10-CM | POA: Diagnosis not present

## 2023-09-19 DIAGNOSIS — G8929 Other chronic pain: Secondary | ICD-10-CM | POA: Diagnosis not present

## 2023-09-19 DIAGNOSIS — R2689 Other abnormalities of gait and mobility: Secondary | ICD-10-CM | POA: Diagnosis not present

## 2023-09-19 DIAGNOSIS — M5442 Lumbago with sciatica, left side: Secondary | ICD-10-CM | POA: Diagnosis not present

## 2023-09-19 DIAGNOSIS — M5441 Lumbago with sciatica, right side: Secondary | ICD-10-CM | POA: Diagnosis not present

## 2023-09-19 DIAGNOSIS — R29898 Other symptoms and signs involving the musculoskeletal system: Secondary | ICD-10-CM | POA: Diagnosis not present

## 2023-10-10 DIAGNOSIS — M5116 Intervertebral disc disorders with radiculopathy, lumbar region: Secondary | ICD-10-CM | POA: Diagnosis not present

## 2023-10-10 DIAGNOSIS — M5441 Lumbago with sciatica, right side: Secondary | ICD-10-CM | POA: Diagnosis not present

## 2023-10-10 DIAGNOSIS — G8929 Other chronic pain: Secondary | ICD-10-CM | POA: Diagnosis not present

## 2023-10-10 DIAGNOSIS — M5386 Other specified dorsopathies, lumbar region: Secondary | ICD-10-CM | POA: Diagnosis not present

## 2023-10-10 DIAGNOSIS — R29898 Other symptoms and signs involving the musculoskeletal system: Secondary | ICD-10-CM | POA: Diagnosis not present

## 2023-10-10 DIAGNOSIS — M5442 Lumbago with sciatica, left side: Secondary | ICD-10-CM | POA: Diagnosis not present

## 2023-10-10 DIAGNOSIS — R2689 Other abnormalities of gait and mobility: Secondary | ICD-10-CM | POA: Diagnosis not present

## 2023-10-12 ENCOUNTER — Other Ambulatory Visit: Payer: Self-pay | Admitting: Internal Medicine

## 2023-10-12 DIAGNOSIS — E1169 Type 2 diabetes mellitus with other specified complication: Secondary | ICD-10-CM

## 2023-10-12 DIAGNOSIS — E118 Type 2 diabetes mellitus with unspecified complications: Secondary | ICD-10-CM

## 2023-10-12 DIAGNOSIS — N529 Male erectile dysfunction, unspecified: Secondary | ICD-10-CM

## 2023-10-12 DIAGNOSIS — I10 Essential (primary) hypertension: Secondary | ICD-10-CM

## 2023-10-14 ENCOUNTER — Telehealth: Payer: Self-pay | Admitting: Internal Medicine

## 2023-10-14 DIAGNOSIS — I25810 Atherosclerosis of coronary artery bypass graft(s) without angina pectoris: Secondary | ICD-10-CM | POA: Diagnosis not present

## 2023-10-14 NOTE — Telephone Encounter (Signed)
 Requested Prescriptions  Pending Prescriptions Disp Refills   metoprolol  succinate (TOPROL -XL) 100 MG 24 hr tablet [Pharmacy Med Name: METOPROLOL  SUCC ER 100 MG TAB] 90 tablet 0    Sig: TAKE 1 TABLET BY MOUTH EVERY DAY WITH OR IMMEDIATELY FOLLOWING A MEAL     Cardiovascular:  Beta Blockers Passed - 10/14/2023 11:05 AM      Passed - Last BP in normal range    BP Readings from Last 1 Encounters:  08/26/23 124/72         Passed - Last Heart Rate in normal range    Pulse Readings from Last 1 Encounters:  08/26/23 81         Passed - Valid encounter within last 6 months    Recent Outpatient Visits           1 month ago Essential (primary) hypertension   [place] [facility] at [facility], Leita DEL, MD   2 months ago Essential (primary) hypertension   [place] [facility] at [facility], Leita DEL, MD   4 months ago Degeneration of intervertebral disc of lumbar region with lower extremity pain   [place] [facility] at [facility], Leita DEL, MD   5 months ago Essential (primary) hypertension   [place] [facility] at [facility], Leita DEL, MD       Future Appointments             In 2 months Justus Leita DEL, MD [facility] at [facility], [facility]             amLODipine  (NORVASC ) 5 MG tablet [Pharmacy Med Name: AMLODIPINE  BESYLATE 5 MG TAB] 90 tablet 0    Sig: TAKE 1 TABLET (5 MG TOTAL) BY MOUTH DAILY.     Cardiovascular: Calcium  Channel Blockers 2 Passed - 10/14/2023 11:05 AM      Passed - Last BP in normal range    BP Readings from Last 1 Encounters:  08/26/23 124/72         Passed - Last Heart Rate in normal range    Pulse Readings from Last 1 Encounters:  08/26/23 81         Passed - Valid encounter within last 6 months    Recent Outpatient Visits           1 month ago Essential (primary)  hypertension   [place] [facility] at [facility], Leita DEL, MD   2 months ago Essential (primary) hypertension   [place] [facility] at [facility], Leita DEL, MD   4 months ago Degeneration of intervertebral disc of lumbar region with lower extremity pain   [place] [facility] at [facility], Leita DEL, MD   5 months ago Essential (primary) hypertension    Primary Care & Sports Medicine at [facility], Leita DEL, MD       Future Appointments             In 2 months Justus Leita DEL, MD [facility] Primary [facility] Sports [facility] at [facility], [facility]             MOUNJARO  7.5 MG/0.5ML Pen [Pharmacy Med Name: MOUNJARO  7.5 MG/0.5 ML PEN]      Sig: INJECT 7.5 MG SUBCUTANEOUSLY WEEKLY  Off-Protocol Failed - 10/14/2023 11:05 AM      Failed - Medication not assigned to a protocol, review manually.      Passed - Valid encounter within last 12 months    Recent Outpatient Visits           1 month ago Essential (primary) hypertension   [place] [facility] at [facility], Leita DEL, MD   2 months ago Essential (primary) hypertension   [place] [facility] Medicine at [facility], Leita DEL, MD   4 months ago Degeneration of intervertebral disc of lumbar region with lower extremity pain   [place] [facility] at [facility], Leita DEL, MD   5 months ago Essential (primary) hypertension   [place] [facility] at [facility], Leita DEL, MD       Future Appointments             In 2 months Justus Leita DEL, MD [facility] Primary [facility] Sports Medicine at [facility], [facility]             MOUNJARO  10 MG/0.5ML Pen [Pharmacy Med Name: MOUNJARO  10 MG/0.5 ML PEN]      Sig: INJECT 10 MG INTO THE SKIN ONE TIME PER WEEK      Off-Protocol Failed - 10/14/2023 11:05 AM      Failed - Medication not assigned to a protocol, review manually.      Passed - Valid encounter within last 12 months    Recent Outpatient Visits           1 month ago Essential (primary) hypertension   [place] [facility] at [facility], Leita DEL, MD   2 months ago Essential (primary) hypertension   [place] [facility] Medicine at [facility], Leita DEL, MD   4 months ago Degeneration of intervertebral disc of lumbar region with lower extremity pain   [place] [facility] Medicine at [facility], Leita DEL, MD   5 months ago Essential (primary) hypertension   [place] [facility] Medicine at [facility], Leita DEL, MD       Future Appointments             In 2 months Justus Leita DEL, MD [facility] Primary Care & Sports Medicine at [facility], [facility]             Choline Fenofibrate  (FENOFIBRIC ACID ) 135 MG CPDR [Pharmacy Med Name: FENOFIBRIC ACID  DR 135 MG CAP] 90 capsule 0    Sig: TAKE 1 CAPSULE BY MOUTH EVERY DAY     Cardiovascular:  Antilipid - Fibric Acid Derivatives Failed - 10/14/2023 11:05 AM      Failed - HGB in normal range and within 360 days    Hemoglobin  Date Value Ref Range Status  12/10/2022 17.9 (H) 13.0 - 17.7 g/dL Final         Failed - HCT in normal range and within 360 days    Hematocrit  Date Value Ref Range Status  12/10/2022 53.6 (H) 37.5 - 51.0 % Final         Failed - Lipid Panel in normal range within the last 12 months    Cholesterol, Total  Date Value Ref Range Status  12/10/2022 73 (L) 100 - 199 mg/dL Final   LDL Chol Calc (NIH)  Date Value Ref Range Status  12/10/2022 20 0 - 99 mg/dL Final   HDL  Date Value Ref Range Status  12/10/2022 38 (L) >39 mg/dL Final   Triglycerides  Date Value Ref Range Status  12/10/2022 67 0 - 149 mg/dL Final         Passed - ALT in  normal range and within 360 days    ALT  Date Value Ref Range Status  12/10/2022 38 0 - 44 IU/L Final         Passed - AST in normal range and within 360 days    AST  Date Value Ref Range Status  12/10/2022 27 0 - 40 IU/L Final         Passed - Cr in normal range and within 360 days    Creatinine, Ser  Date Value Ref Range Status  12/10/2022 1.09 0.76 - 1.27 mg/dL Final         Passed - PLT in normal range and within 360 days    Platelets  Date Value Ref Range Status  12/10/2022 167 150 - 450 x10E3/uL Final         Passed - WBC in normal range and within 360 days    WBC  Date Value Ref Range Status  12/10/2022 6.1 3.4 - 10.8 x10E3/uL Final         Passed - eGFR is 30 or above and within 360 days    GFR calc Af Amer  Date Value Ref Range Status  02/29/2020 82 >59 mL/min/1.73 Final    Comment:    **In accordance with recommendations from the NKF-ASN Task force,**   [facility] is in the process of updating its eGFR calculation to the   2021 CKD-EPI creatinine equation that estimates kidney function   without a race variable.    GFR calc non Af Amer  Date Value Ref Range Status  06/20/2020 75  Final   eGFR  Date Value Ref Range Status  12/10/2022 74 >59 mL/min/1.73 Final         Passed - Valid encounter within last 12 months    Recent Outpatient Visits           1 month ago Essential (primary) hypertension   [place] [facility] at [facility], Leita DEL, MD   2 months ago Essential (primary) hypertension   [place] [facility] Medicine at [facility], Leita DEL, MD   4 months ago Degeneration of intervertebral disc of lumbar region with lower extremity pain   [place] [facility] Medicine at [facility], Leita DEL, MD   5 months ago Essential (primary) hypertension   [place] [facility] Medicine at [facility], Leita DEL, MD       Future Appointments              In 2 months Justus Leita DEL, MD [facility] Primary Care & Sports Medicine at [facility], [facility]             metFORMIN  (GLUCOPHAGE -XR) 500 MG 24 hr tablet [Pharmacy Med Name: METFORMIN  HCL ER 500 MG TABLET] 90 tablet 0    Sig: TAKE 1 TABLET BY MOUTH EVERY DAY WITH BREAKFAST     Endocrinology:  Diabetes - Biguanides Failed - 10/14/2023 11:05 AM      Failed - B12 Level in normal range and within 720 days    Vitamin B-12  Date  Value Ref Range Status  12/02/2020 953 232 - 1,245 pg/mL Final         Passed - Cr in normal range and within 360 days    Creatinine, Ser  Date Value Ref Range Status  12/10/2022 1.09 0.76 - 1.27 mg/dL Final         Passed - HBA1C is between 0 and 7.9 and within 180 days    Hgb A1c MFr Bld  Date Value Ref Range Status  08/09/2023 6.7 (H) 4.8 - 5.6 % Final    Comment:             Prediabetes: 5.7 - 6.4          Diabetes: >6.4          Glycemic control for adults with diabetes: <7.0          Passed - eGFR in normal range and within 360 days    GFR calc Af Amer  Date Value Ref Range Status  02/29/2020 82 >59 mL/min/1.73 Final    Comment:    **In accordance with recommendations from the NKF-ASN Task force,**   [facility] is in the process of updating its eGFR calculation to the   2021 CKD-EPI creatinine equation that estimates kidney function   without a race variable.    GFR calc non Af Amer  Date Value Ref Range Status  06/20/2020 75  Final   eGFR  Date Value Ref Range Status  12/10/2022 74 >59 mL/min/1.73 Final         Passed - Valid encounter within last 6 months    Recent Outpatient Visits           1 month ago Essential (primary) hypertension   [place] [facility] at [facility], Leita DEL, MD   2 months ago Essential (primary) hypertension   [place] [facility] at [facility], Leita DEL, MD   4 months ago Degeneration of intervertebral disc of  lumbar region with lower extremity pain   [place] [facility] at [facility], Leita DEL, MD   5 months ago Essential (primary) hypertension   [place] [facility] Medicine at [facility] Leita DEL, MD       Future Appointments             In 2 months Justus Leita DEL, MD [facility] Primary Care & Sports Medicine at [facility], [facility]            Passed - CBC within normal limits and completed in the last 12 months    WBC  Date Value Ref Range Status  12/10/2022 6.1 3.4 - 10.8 x10E3/uL Final   RBC  Date Value Ref Range Status  12/10/2022 5.96 (H) 4.14 - 5.80 x10E6/uL Final  10/09/2014 100 (A) 4.69 - 6.13 M/uL Preliminary   Hemoglobin  Date Value Ref Range Status  12/10/2022 17.9 (H) 13.0 - 17.7 g/dL Final   Hematocrit  Date Value Ref Range Status  12/10/2022 53.6 (H) 37.5 - 51.0 % Final   MCHC  Date Value Ref Range Status  12/10/2022 33.4 31.5 - 35.7 g/dL Final   [facility]  Date Value Ref Range Status  12/10/2022 30.0 26.6 - 33.0 pg Final   MCV  Date Value Ref Range Status  12/10/2022 90 79 - 97 fL Final   No results found for: PLTCOUNTKUC, LABPLAT, POCPLA RDW  Date Value Ref Range  Status  12/10/2022 13.8 11.6 - 15.4 % Final          atorvastatin  (LIPITOR) 40 MG tablet [Pharmacy Med Name: ATORVASTATIN  40 MG TABLET] 90 tablet 0    Sig: TAKE 1 TABLET BY MOUTH EVERY DAY     Cardiovascular:  Antilipid - Statins Failed - 10/14/2023 11:05 AM      Failed - Lipid Panel in normal range within the last 12 months    Cholesterol, Total  Date Value Ref Range Status  12/10/2022 73 (L) 100 - 199 mg/dL Final   LDL Chol Calc (NIH)  Date Value Ref Range Status  12/10/2022 20 0 - 99 mg/dL Final   HDL  Date Value Ref Range Status  12/10/2022 38 (L) >39 mg/dL Final   Triglycerides  Date Value Ref Range Status  12/10/2022 67 0 - 149 mg/dL Final         Passed - Patient is not pregnant      Passed  - Valid encounter within last 12 months    Recent Outpatient Visits           1 month ago Essential (primary) hypertension   [place] [facility] at [facility], Leita DEL, MD   2 months ago Essential (primary) hypertension   [place] [facility] Medicine at [facility], Leita DEL, MD   4 months ago Degeneration of intervertebral disc of lumbar region with lower extremity pain   [place] [facility] Care [facility] Medicine at [facility], Leita DEL, MD   5 months ago Essential (primary) hypertension   [place] [facility] Medicine at [facility], Leita DEL, MD       Future Appointments             In 2 months Justus Leita DEL, MD [facility] Primary Care & Sports Medicine at [facility], [facility]             lisinopril  (ZESTRIL ) 10 MG tablet [Pharmacy Med Name: LISINOPRIL  10 MG TABLET] 90 tablet 0    Sig: TAKE 1 TABLET BY MOUTH EVERY DAY     Cardiovascular:  ACE Inhibitors Failed - 10/14/2023 11:05 AM      Failed - Cr in normal range and within 180 days    Creatinine, Ser  Date Value Ref Range Status  12/10/2022 1.09 0.76 - 1.27 mg/dL Final         Failed - K in normal range and within 180 days    Potassium  Date Value Ref Range Status  12/10/2022 5.3 (H) 3.5 - 5.2 mmol/L Final         Passed - Patient is not pregnant      Passed - Last BP in normal range    BP Readings from Last 1 Encounters:  08/26/23 124/72         Passed - Valid encounter within last 6 months    Recent Outpatient Visits           1 month ago Essential (primary) hypertension   [place] [facility] at [facility], Leita DEL, MD   2 months ago Essential (primary) hypertension    Primary Care & Sports Medicine at [facility], Leita DEL, MD   4 months ago Degeneration of intervertebral disc of lumbar region with lower extremity pain   [facility]  Health Primary Care & Sports Medicine at [facility],  Leita DEL, MD   5 months ago Essential (primary) hypertension   [place] [facility] Medicine at [facility], Leita DEL, MD       Future Appointments             In 2 months Justus Leita DEL, MD [facility] Primary Care & Sports Medicine at [facility], [facility]             tadalafil  (CIALIS ) 20 MG tablet [Pharmacy Med Name: TADALAFIL  20 MG TABLET] 10 tablet 0    Sig: TAKE 1/2 TO 1 TABLET (10-20 MG TOTAL) BY MOUTH EVERY OTHER DAY AS NEEDED FOR ERECTILE DYSFUNCTION     Urology: Erectile Dysfunction Agents Passed - 10/14/2023 11:05 AM      Passed - AST in normal range and within 360 days    AST  Date Value Ref Range Status  12/10/2022 27 0 - 40 IU/L Final         Passed - ALT in normal range and within 360 days    ALT  Date Value Ref Range Status  12/10/2022 38 0 - 44 IU/L Final         Passed - Last BP in normal range    BP Readings from Last 1 Encounters:  08/26/23 124/72         Passed - Valid encounter within last 12 months    Recent Outpatient Visits           1 month ago Essential (primary) hypertension   [place] [facility] Medicine at [facility], Leita DEL, MD   2 months ago Essential (primary) hypertension   [place] Primary Care [facility] Sports Medicine at [facility], Leita DEL, MD   4 months ago Degeneration of intervertebral disc of lumbar region with lower extremity pain   [place] [facility] Sports Medicine at [facility], Leita DEL, MD   5 months ago Essential (primary) hypertension    Primary Care & Sports Medicine at [facility], Leita DEL, MD       Future Appointments             In 2 months Justus, Leita DEL, MD [facility] Primary Care & Sports Medicine at [facility], [facility]

## 2023-10-14 NOTE — Telephone Encounter (Signed)
 Copied from CRM [id number]. Topic: Clinical - Prescription Issue >> Oct 14, 2023  2:57 PM Kevelyn M wrote: Patient is requesting an increase in the tirzepatide  (MOUNJARO ) in the 12.5MG . Refill request has already be sent for his prior dosage. Call back #[phone]

## 2023-10-14 NOTE — Telephone Encounter (Signed)
 Copied from CRM [id number]. Topic: Clinical - Prescription Issue >> Oct 14, 2023  2:57 PM Kevelyn M wrote: Patient is requesting an increase in the tirzepatide  (MOUNJARO ) in the 12.5MG . Refill request has already be sent for his prior dosage. Call back #[phone] >> Oct 14, 2023  4:45 PM Zebedee SAUNDERS wrote: Pt called stated pharmacy stated (MOUNJARO ) in the 12.5MG  has been denied. Pt would like a call back at [phone] as to why.

## 2023-10-14 NOTE — Telephone Encounter (Signed)
 Requested medication (s) are due for refill today: Yes  Requested medication (s) are on the active medication list: Yes  Last refill:    Future visit scheduled: Yes  Notes to clinic:  Manual review.    Requested Prescriptions  Pending Prescriptions Disp Refills   metoprolol  succinate (TOPROL -XL) 100 MG 24 hr tablet [Pharmacy Med Name: METOPROLOL  SUCC ER 100 MG TAB] 90 tablet 0    Sig: TAKE 1 TABLET BY MOUTH EVERY DAY WITH OR IMMEDIATELY FOLLOWING A MEAL     Cardiovascular:  Beta Blockers Passed - 10/14/2023 11:06 AM      Passed - Last BP in normal range    BP Readings from Last 1 Encounters:  08/26/23 124/72         Passed - Last Heart Rate in normal range    Pulse Readings from Last 1 Encounters:  08/26/23 81         Passed - Valid encounter within last 6 months    Recent Outpatient Visits           1 month ago Essential (primary) hypertension   [place] [facility] at [facility], Leita DEL, MD   2 months ago Essential (primary) hypertension   [place] [facility] at [facility], Leita DEL, MD   4 months ago Degeneration of intervertebral disc of lumbar region with lower extremity pain   [place] [facility] at [facility], Leita DEL, MD   5 months ago Essential (primary) hypertension   [place] [facility] at [facility], Leita DEL, MD       Future Appointments             In 2 months Justus Leita DEL, MD [facility] at [facility], [facility]             MOUNJARO  7.5 MG/0.5ML Pen [Pharmacy Med Name: MOUNJARO  7.5 MG/0.5 ML PEN]      Sig: INJECT 7.5 MG SUBCUTANEOUSLY WEEKLY     Off-Protocol Failed - 10/14/2023 11:06 AM      Failed - Medication not assigned to a protocol, review manually.      Passed - Valid encounter within last 12 months    Recent Outpatient Visits           1 month ago  Essential (primary) hypertension   [place] [facility] at [facility], Leita DEL, MD   2 months ago Essential (primary) hypertension   [place] [facility] at [facility], Leita DEL, MD   4 months ago Degeneration of intervertebral disc of lumbar region with lower extremity pain   [place] [facility] at [facility], Leita DEL, MD   5 months ago Essential (primary) hypertension    Primary Care & Sports Medicine at [facility], Leita DEL, MD       Future Appointments             In 2 months Justus Leita DEL, MD [facility] at [facility]             MOUNJARO  10 MG/0.5ML Pen [Pharmacy Med Name: MOUNJARO  10 MG/0.5 ML PEN]      Sig: INJECT 10 MG INTO THE SKIN ONE TIME PER WEEK     Off-Protocol Failed -  10/14/2023 11:06 AM      Failed - Medication not assigned to a protocol, review manually.      Passed - Valid encounter within last 12 months    Recent Outpatient Visits           1 month ago Essential (primary) hypertension   [place] [facility] at [facility], Leita DEL, MD   2 months ago Essential (primary) hypertension   [place] [facility] at [facility], Leita DEL, MD   4 months ago Degeneration of intervertebral disc of lumbar region with lower extremity pain   [place] [facility] Medicine at [facility], Leita DEL, MD   5 months ago Essential (primary) hypertension   [place] [facility] at [facility], Leita DEL, MD       Future Appointments             In 2 months Justus Leita DEL, MD [facility] Primary [facility] & [facility] at [facility]            Signed Prescriptions Disp Refills   amLODipine  (NORVASC ) 5 MG tablet 90 tablet 0    Sig: TAKE 1 TABLET (5  MG TOTAL) BY MOUTH DAILY.     Cardiovascular: Calcium  Channel Blockers 2 Passed - 10/14/2023 11:06 AM      Passed - Last BP in normal range    BP Readings from Last 1 Encounters:  08/26/23 124/72         Passed - Last Heart Rate in normal range    Pulse Readings from Last 1 Encounters:  08/26/23 81         Passed - Valid encounter within last 6 months    Recent Outpatient Visits           1 month ago Essential (primary) hypertension   [place] [facility] at [facility], Leita DEL, MD   2 months ago Essential (primary) hypertension   [place] [facility] at [facility], Leita DEL, MD   4 months ago Degeneration of intervertebral disc of lumbar region with lower extremity pain   [place] [facility] at [facility], Leita DEL, MD   5 months ago Essential (primary) hypertension   [place] [facility] & [facility] Medicine at [facility], Leita DEL, MD       Future Appointments             In 2 months Justus Leita DEL, MD [facility] Primary Care & Sports Medicine at [facility], [facility]             [place] Fenofibrate  (FENOFIBRIC ACID ) 135 MG CPDR 90 capsule 0    Sig: TAKE 1 CAPSULE BY MOUTH EVERY DAY     Cardiovascular:  Antilipid - Fibric Acid Derivatives Failed - 10/14/2023 11:06 AM      Failed - HGB in normal range and within 360 days    Hemoglobin  Date Value Ref Range Status  12/10/2022 17.9 (H) 13.0 - 17.7 g/dL Final         Failed - HCT in normal range and within 360 days    Hematocrit  Date Value Ref Range Status  12/10/2022 53.6 (H) 37.5 - 51.0 % Final         Failed - Lipid Panel in normal range within  the last 12 months    Cholesterol, Total  Date Value Ref Range Status  12/10/2022 73 (L) 100 - 199 mg/dL Final   LDL Chol Calc (NIH)  Date Value Ref Range Status  12/10/2022 20 0 - 99 mg/dL Final   HDL  Date Value Ref Range Status   12/10/2022 38 (L) >39 mg/dL Final   Triglycerides  Date Value Ref Range Status  12/10/2022 67 0 - 149 mg/dL Final         Passed - ALT in normal range and within 360 days    ALT  Date Value Ref Range Status  12/10/2022 38 0 - 44 IU/L Final         Passed - AST in normal range and within 360 days    AST  Date Value Ref Range Status  12/10/2022 27 0 - 40 IU/L Final         Passed - Cr in normal range and within 360 days    Creatinine, Ser  Date Value Ref Range Status  12/10/2022 1.09 0.76 - 1.27 mg/dL Final         Passed - PLT in normal range and within 360 days    Platelets  Date Value Ref Range Status  12/10/2022 167 150 - 450 x10E3/uL Final         Passed - WBC in normal range and within 360 days    WBC  Date Value Ref Range Status  12/10/2022 6.1 3.4 - 10.8 x10E3/uL Final         Passed - eGFR is 30 or above and within 360 days    GFR calc Af Amer  Date Value Ref Range Status  02/29/2020 82 >59 mL/min/1.73 Final    Comment:    **In accordance with recommendations from the NKF-ASN Task force,**   [facility] is in the process of updating its eGFR calculation to the   2021 CKD-EPI creatinine equation that estimates kidney function   without a race variable.    GFR calc non Af Amer  Date Value Ref Range Status  06/20/2020 75  Final   eGFR  Date Value Ref Range Status  12/10/2022 74 >59 mL/min/1.73 Final         Passed - Valid encounter within last 12 months    Recent Outpatient Visits           1 month ago Essential (primary) hypertension   [place] [facility] at [facility], Leita DEL, MD   2 months ago Essential (primary) hypertension   [place] [facility] at [facility], Leita DEL, MD   4 months ago Degeneration of intervertebral disc of lumbar region with lower extremity pain   [place] [facility] at [facility], Leita DEL, MD   5 months ago  Essential (primary) hypertension    Primary Care & Sports Medicine at [facility], Leita DEL, MD       Future Appointments             In 2 months Justus Leita DEL, MD [facility] Medicine at [facility]             metFORMIN  (GLUCOPHAGE -XR) 500 MG 24 hr tablet 90 tablet 0    Sig: TAKE 1 TABLET BY MOUTH EVERY DAY WITH BREAKFAST     Endocrinology:  Diabetes - Biguanides Failed - 10/14/2023 11:06 AM  Failed - B12 Level in normal range and within 720 days    Vitamin B-12  Date Value Ref Range Status  12/02/2020 953 232 - 1,245 pg/mL Final         Passed - Cr in normal range and within 360 days    Creatinine, Ser  Date Value Ref Range Status  12/10/2022 1.09 0.76 - 1.27 mg/dL Final         Passed - HBA1C is between 0 and 7.9 and within 180 days    Hgb A1c MFr Bld  Date Value Ref Range Status  08/09/2023 6.7 (H) 4.8 - 5.6 % Final    Comment:             Prediabetes: 5.7 - 6.4          Diabetes: >6.4          Glycemic control for adults with diabetes: <7.0          Passed - eGFR in normal range and within 360 days    GFR calc Af Amer  Date Value Ref Range Status  02/29/2020 82 >59 mL/min/1.73 Final    Comment:    **In accordance with recommendations from the NKF-ASN Task force,**   [facility] is in the process of updating its eGFR calculation to the   2021 CKD-EPI creatinine equation that estimates kidney function   without a race variable.    GFR calc non Af Amer  Date Value Ref Range Status  06/20/2020 75  Final   eGFR  Date Value Ref Range Status  12/10/2022 74 >59 mL/min/1.73 Final         Passed - Valid encounter within last 6 months    Recent Outpatient Visits           1 month ago Essential (primary) hypertension   [place] [facility] at [facility], Leita DEL, MD   2 months ago Essential (primary) hypertension   [place] [facility] Medicine  at [facility], Leita DEL, MD   4 months ago Degeneration of intervertebral disc of lumbar region with lower extremity pain   [place] [facility] Medicine at [facility], Leita DEL, MD   5 months ago Essential (primary) hypertension   [place] [facility] at [facility], Leita DEL, MD       Future Appointments             In 2 months Justus Leita DEL, MD [facility] Primary Care & Sports Medicine at [facility], [facility]            Passed - CBC within normal limits and completed in the last 12 months    WBC  Date Value Ref Range Status  12/10/2022 6.1 3.4 - 10.8 x10E3/uL Final   RBC  Date Value Ref Range Status  12/10/2022 5.96 (H) 4.14 - 5.80 x10E6/uL Final  10/09/2014 100 (A) 4.69 - 6.13 M/uL Preliminary   Hemoglobin  Date Value Ref Range Status  12/10/2022 17.9 (H) 13.0 - 17.7 g/dL Final   Hematocrit  Date Value Ref Range Status  12/10/2022 53.6 (H) 37.5 - 51.0 % Final   MCHC  Date Value Ref Range Status  12/10/2022 33.4 31.5 - 35.7 g/dL Final   [facility]  Date Value Ref Range Status  12/10/2022 30.0 26.6 - 33.0 pg Final   MCV  Date Value Ref Range Status  12/10/2022 90 79 -  97 fL Final   No results found for: PLTCOUNTKUC, LABPLAT, POCPLA RDW  Date Value Ref Range Status  12/10/2022 13.8 11.6 - 15.4 % Final          atorvastatin  (LIPITOR) 40 MG tablet 90 tablet 0    Sig: TAKE 1 TABLET BY MOUTH EVERY DAY     Cardiovascular:  Antilipid - Statins Failed - 10/14/2023 11:06 AM      Failed - Lipid Panel in normal range within the last 12 months    Cholesterol, Total  Date Value Ref Range Status  12/10/2022 73 (L) 100 - 199 mg/dL Final   LDL Chol Calc (NIH)  Date Value Ref Range Status  12/10/2022 20 0 - 99 mg/dL Final   HDL  Date Value Ref Range Status  12/10/2022 38 (L) >39 mg/dL Final   Triglycerides  Date Value Ref Range Status  12/10/2022 67 0 - 149 mg/dL Final          Passed - Patient is not pregnant      Passed - Valid encounter within last 12 months    Recent Outpatient Visits           1 month ago Essential (primary) hypertension   [place] [facility] at [facility], Leita DEL, MD   2 months ago Essential (primary) hypertension   [place] [facility] at [facility], Leita DEL, MD   4 months ago Degeneration of intervertebral disc of lumbar region with lower extremity pain   [place] [facility] at [facility], Leita DEL, MD   5 months ago Essential (primary) hypertension   [place] [facility] at [facility], Leita DEL, MD       Future Appointments             In 2 months Justus Leita DEL, MD [facility] Primary Care & Sports [facility] at [facility], [facility]             lisinopril  (ZESTRIL ) 10 MG tablet 90 tablet 0    Sig: TAKE 1 TABLET BY MOUTH EVERY DAY     Cardiovascular:  ACE Inhibitors Failed - 10/14/2023 11:06 AM      Failed - Cr in normal range and within 180 days    Creatinine, Ser  Date Value Ref Range Status  12/10/2022 1.09 0.76 - 1.27 mg/dL Final         Failed - K in normal range and within 180 days    Potassium  Date Value Ref Range Status  12/10/2022 5.3 (H) 3.5 - 5.2 mmol/L Final         Passed - Patient is not pregnant      Passed - Last BP in normal range    BP Readings from Last 1 Encounters:  08/26/23 124/72         Passed - Valid encounter within last 6 months    Recent Outpatient Visits           1 month ago Essential (primary) hypertension   [place] [facility] at [facility], Leita DEL, MD   2 months ago Essential (primary) hypertension    Primary Care & Sports Medicine at [facility], Leita DEL, MD   4 months ago Degeneration of intervertebral disc of lumbar region with lower extremity pain   [facility] Primary Care & Sports Medicine  at [facility], Leita DEL, MD   5 months ago Essential (primary) hypertension   [place] [facility] Medicine at [facility], Leita DEL, MD       Future Appointments             In 2 months Justus Leita DEL, MD [facility] Primary Care & Sports Medicine at [facility]             tadalafil  (CIALIS ) 20 MG tablet 10 tablet 0    Sig: TAKE 1/2 TO 1 TABLET (10-20 MG TOTAL) BY MOUTH EVERY OTHER DAY AS NEEDED FOR ERECTILE DYSFUNCTION     Urology: Erectile Dysfunction Agents Passed - 10/14/2023 11:06 AM      Passed - AST in normal range and within 360 days    AST  Date Value Ref Range Status  12/10/2022 27 0 - 40 IU/L Final         Passed - ALT in normal range and within 360 days    ALT  Date Value Ref Range Status  12/10/2022 38 0 - 44 IU/L Final         Passed - Last BP in normal range    BP Readings from Last 1 Encounters:  08/26/23 124/72         Passed - Valid encounter within last 12 months    Recent Outpatient Visits           1 month ago Essential (primary) hypertension   [place] [facility] Medicine at [facility], Leita DEL, MD   2 months ago Essential (primary) hypertension   [place] [facility] Medicine at [facility], Leita DEL, MD   4 months ago Degeneration of intervertebral disc of lumbar region with lower extremity pain   [place] [facility] Medicine at [facility], Leita DEL, MD   5 months ago Essential (primary) hypertension   [place] [facility] Care & Sports Medicine at [facility], Leita DEL, MD       Future Appointments             In 2 months Justus, Leita DEL, MD [facility] Primary Care & Sports Medicine at [facility], [facility]

## 2023-10-17 ENCOUNTER — Other Ambulatory Visit: Payer: Self-pay

## 2023-10-17 MED ORDER — TIRZEPATIDE 12.5 MG/0.5ML ~~LOC~~ SOAJ
12.5000 mg | SUBCUTANEOUS | 0 refills | Status: DC
Start: 2023-10-17 — End: 2024-01-31

## 2023-10-17 NOTE — Telephone Encounter (Signed)
 Spoke with pt to confirm he is currently taking 10 mg weekly. He confirmed and would like to increase his dose. Sent in new dose.  CM

## 2023-10-17 NOTE — Telephone Encounter (Signed)
 Not a patient here.

## 2023-10-17 NOTE — Telephone Encounter (Signed)
 I have spoken with the patient and increased the dose.

## 2023-10-27 DIAGNOSIS — Z79899 Other long term (current) drug therapy: Secondary | ICD-10-CM | POA: Diagnosis not present

## 2023-10-27 DIAGNOSIS — K640 First degree hemorrhoids: Secondary | ICD-10-CM | POA: Diagnosis not present

## 2023-10-27 DIAGNOSIS — Z885 Allergy status to narcotic agent status: Secondary | ICD-10-CM | POA: Diagnosis not present

## 2023-10-27 DIAGNOSIS — D123 Benign neoplasm of transverse colon: Secondary | ICD-10-CM | POA: Diagnosis not present

## 2023-10-27 DIAGNOSIS — Z860101 Personal history of adenomatous and serrated colon polyps: Secondary | ICD-10-CM | POA: Diagnosis not present

## 2023-10-27 DIAGNOSIS — I2510 Atherosclerotic heart disease of native coronary artery without angina pectoris: Secondary | ICD-10-CM | POA: Diagnosis not present

## 2023-10-27 DIAGNOSIS — K5730 Diverticulosis of large intestine without perforation or abscess without bleeding: Secondary | ICD-10-CM | POA: Diagnosis not present

## 2023-10-27 DIAGNOSIS — Z1211 Encounter for screening for malignant neoplasm of colon: Secondary | ICD-10-CM | POA: Diagnosis not present

## 2023-10-27 DIAGNOSIS — Z7984 Long term (current) use of oral hypoglycemic drugs: Secondary | ICD-10-CM | POA: Diagnosis not present

## 2023-10-27 DIAGNOSIS — E119 Type 2 diabetes mellitus without complications: Secondary | ICD-10-CM | POA: Diagnosis not present

## 2023-10-27 DIAGNOSIS — Z7985 Long-term (current) use of injectable non-insulin antidiabetic drugs: Secondary | ICD-10-CM | POA: Diagnosis not present

## 2023-10-27 DIAGNOSIS — I714 Abdominal aortic aneurysm, without rupture, unspecified: Secondary | ICD-10-CM | POA: Diagnosis not present

## 2023-10-27 DIAGNOSIS — Z7982 Long term (current) use of aspirin: Secondary | ICD-10-CM | POA: Diagnosis not present

## 2023-10-27 DIAGNOSIS — I10 Essential (primary) hypertension: Secondary | ICD-10-CM | POA: Diagnosis not present

## 2023-10-27 DIAGNOSIS — Z951 Presence of aortocoronary bypass graft: Secondary | ICD-10-CM | POA: Diagnosis not present

## 2023-11-18 DIAGNOSIS — M48062 Spinal stenosis, lumbar region with neurogenic claudication: Secondary | ICD-10-CM | POA: Diagnosis not present

## 2023-11-18 DIAGNOSIS — M48061 Spinal stenosis, lumbar region without neurogenic claudication: Secondary | ICD-10-CM | POA: Diagnosis not present

## 2023-12-02 DIAGNOSIS — M48062 Spinal stenosis, lumbar region with neurogenic claudication: Secondary | ICD-10-CM | POA: Diagnosis not present

## 2023-12-02 DIAGNOSIS — M48061 Spinal stenosis, lumbar region without neurogenic claudication: Secondary | ICD-10-CM | POA: Diagnosis not present

## 2023-12-13 ENCOUNTER — Other Ambulatory Visit: Payer: Self-pay | Admitting: Internal Medicine

## 2023-12-13 NOTE — Telephone Encounter (Signed)
 Requested medications are due for refill today.  yes  Requested medications are on the active medications list.  yes  Last refill. 08/08/2023 #90 0 rf 08/08/2023  Future visit scheduled.   yes  Notes to clinic.  Labs are expired.    Requested Prescriptions  Pending Prescriptions Disp Refills   clopidogrel  (PLAVIX ) 75 MG tablet [Pharmacy Med Name: CLOPIDOGREL  75 MG TABLET] 90 tablet 0    Sig: TAKE 1 TABLET BY MOUTH EVERY DAY     Hematology: Antiplatelets - clopidogrel  Failed - 12/13/2023  5:54 PM      Failed - HCT in normal range and within 180 days    Hematocrit  Date Value Ref Range Status  12/10/2022 53.6 (H) 37.5 - 51.0 % Final         Failed - HGB in normal range and within 180 days    Hemoglobin  Date Value Ref Range Status  12/10/2022 17.9 (H) 13.0 - 17.7 g/dL Final         Failed - PLT in normal range and within 180 days    Platelets  Date Value Ref Range Status  12/10/2022 167 150 - 450 x10E3/uL Final         Failed - Cr in normal range and within 360 days    Creatinine, Ser  Date Value Ref Range Status  12/10/2022 1.09 0.76 - 1.27 mg/dL Final         Passed - Valid encounter within last 6 months    Recent Outpatient Visits           3 months ago Essential (primary) hypertension   [place] [facility] Medicine at [facility], Leita DEL, MD   4 months ago Essential (primary) hypertension   [place] [facility] Medicine at [facility], Leita DEL, MD   6 months ago Degeneration of intervertebral disc of lumbar region with lower extremity pain   [place] [facility] Care [facility] Sports Medicine at [facility], Leita DEL, MD   7 months ago Essential (primary) hypertension   [place] [facility] Care & Sports Medicine at [facility], Leita DEL, MD       Future Appointments             In 3 days Justus, Leita DEL, MD [facility] Primary Care & Sports Medicine at [facility], [phone] [place]

## 2023-12-14 ENCOUNTER — Telehealth: Payer: Self-pay

## 2023-12-14 NOTE — Telephone Encounter (Signed)
 Copied from CRM #[id number]. Topic: Clinical - Medication Question >> Dec 14, 2023  9:49 AM Tobias CROME wrote: Reason for CRM: Patient inquiring why his plavix  was denied. Patient has upcoming appointment on 12/16/23 at 8am.   Patient requesting refill to be sent in

## 2023-12-16 ENCOUNTER — Encounter: Payer: Self-pay | Admitting: Internal Medicine

## 2023-12-16 ENCOUNTER — Ambulatory Visit: Payer: Self-pay | Admitting: Internal Medicine

## 2023-12-16 ENCOUNTER — Other Ambulatory Visit: Payer: Self-pay

## 2023-12-16 VITALS — BP 128/70 | HR 69 | Ht 71.0 in | Wt 226.0 lb

## 2023-12-16 DIAGNOSIS — I739 Peripheral vascular disease, unspecified: Secondary | ICD-10-CM | POA: Diagnosis not present

## 2023-12-16 DIAGNOSIS — I10 Essential (primary) hypertension: Secondary | ICD-10-CM | POA: Diagnosis not present

## 2023-12-16 DIAGNOSIS — Z125 Encounter for screening for malignant neoplasm of prostate: Secondary | ICD-10-CM | POA: Diagnosis not present

## 2023-12-16 DIAGNOSIS — E785 Hyperlipidemia, unspecified: Secondary | ICD-10-CM

## 2023-12-16 DIAGNOSIS — Z0000 Encounter for general adult medical examination without abnormal findings: Secondary | ICD-10-CM | POA: Diagnosis not present

## 2023-12-16 DIAGNOSIS — E118 Type 2 diabetes mellitus with unspecified complications: Secondary | ICD-10-CM | POA: Diagnosis not present

## 2023-12-16 DIAGNOSIS — I2510 Atherosclerotic heart disease of native coronary artery without angina pectoris: Secondary | ICD-10-CM | POA: Diagnosis not present

## 2023-12-16 DIAGNOSIS — Z23 Encounter for immunization: Secondary | ICD-10-CM

## 2023-12-16 DIAGNOSIS — E1169 Type 2 diabetes mellitus with other specified complication: Secondary | ICD-10-CM

## 2023-12-16 DIAGNOSIS — I714 Abdominal aortic aneurysm, without rupture, unspecified: Secondary | ICD-10-CM | POA: Diagnosis not present

## 2023-12-16 MED ORDER — CLOPIDOGREL BISULFATE 75 MG PO TABS: 75.0000 mg | ORAL_TABLET | Freq: Every day | ORAL | 1 refills | Status: AC

## 2023-12-16 NOTE — Assessment & Plan Note (Signed)
 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

## 2023-12-16 NOTE — Assessment & Plan Note (Addendum)
 Blood sugars have been stable.  No hypoglycemic events since last visit. Currently medications are Jardiance , MTF and Mounjaro . Last visit medical regimen changes were to change Ozempic  to Mounjaro .  He has lost almost 30 lbs. Lab Results  Component Value Date   HGBA1C 6.7 (H) 08/09/2023

## 2023-12-16 NOTE — Progress Notes (Signed)
**Note Wyatt-Identified via Obfuscation**  Date:  12/16/2023   Name:  Wyatt Mata   DOB:  October 12, 1955   MRN:  [id number]   Chief Complaint: Annual Exam DEAUNDRE Mata is a 68 y.o. male who presents today for his Complete Annual Exam. He feels well. He reports exercising some. He reports he is sleeping fairly well.   Health Maintenance  Topic Date Due   Medicare Annual Wellness Visit  Never done   Zoster (Shingles) Vaccine (1 of 2) 07/06/2005   DTaP/Tdap/Td vaccine (2 - Tdap) 09/20/2018   COVID-19 Vaccine (3 - 2025-26 season) 11/21/2023   Yearly kidney function blood test for diabetes  12/10/2023   Hemoglobin A1C  02/09/2024   Eye exam for diabetics  03/14/2024   Yearly kidney health urinalysis for diabetes  08/08/2024   Complete foot exam   12/15/2024   Colon Cancer Screening  10/26/2028   Pneumococcal Vaccine for age over 68  Completed   Flu Shot  Completed   Hepatitis C Screening  Completed   HPV Vaccine  Aged Out   Meningitis B Vaccine  Aged Out    Lab Results  Component Value Date   PSA1 3.3 12/10/2022   PSA1 2.0 04/16/2022   PSA1 4.2 (H) 12/07/2021   PSA 1.6 01/07/2014    Hypertension This is a chronic problem. The problem is controlled. Pertinent negatives include no chest pain, headaches, palpitations or shortness of breath.  Diabetes He presents for his follow-up diabetic visit. He has type 2 diabetes mellitus. Pertinent negatives for hypoglycemia include no dizziness, headaches or nervousness/anxiousness. Pertinent negatives for diabetes include no chest pain, no fatigue and no weakness.  Hyperlipidemia This is a chronic problem. The problem is controlled. Pertinent negatives include no chest pain or shortness of breath. Treatments tried: Repatha.    Review of Systems  Constitutional:  Negative for fatigue and unexpected weight change.  HENT:  Negative for nosebleeds and trouble swallowing.   Eyes:  Negative for visual disturbance.  Respiratory:  Negative for cough, chest tightness, shortness  of breath and wheezing.   Cardiovascular:  Negative for chest pain, palpitations and leg swelling.  Gastrointestinal:  Negative for abdominal pain, constipation and diarrhea.  Genitourinary:  Negative for dysuria and hematuria.  Musculoskeletal:  Positive for back pain. Negative for arthralgias.  Skin:  Negative for color change and rash.  Allergic/Immunologic: Negative for environmental allergies.  Neurological:  Negative for dizziness, weakness, light-headedness and headaches.  Psychiatric/Behavioral:  Negative for dysphoric mood and sleep disturbance. The patient is not nervous/anxious.      Lab Results  Component Value Date   NA 141 12/10/2022   K 5.3 (H) 12/10/2022   CO2 22 12/10/2022   GLUCOSE 96 12/10/2022   BUN 23 12/10/2022   CREATININE 1.09 12/10/2022   CALCIUM  9.9 12/10/2022   EGFR 74 12/10/2022   GFRNONAA 75 06/20/2020   Lab Results  Component Value Date   CHOL 73 (L) 12/10/2022   HDL 38 (L) 12/10/2022   LDLCALC 20 12/10/2022   TRIG 67 12/10/2022   CHOLHDL 1.9 12/10/2022   Lab Results  Component Value Date   TSH 1.350 12/10/2022   Lab Results  Component Value Date   HGBA1C 6.7 (H) 08/09/2023   Lab Results  Component Value Date   WBC 6.1 12/10/2022   HGB 17.9 (H) 12/10/2022   HCT 53.6 (H) 12/10/2022   MCV 90 12/10/2022   PLT 167 12/10/2022   Lab Results  Component Value Date   ALT 38 12/10/2022  AST 27 12/10/2022   ALKPHOS 49 12/10/2022   BILITOT 0.7 12/10/2022   No results found for: MARIEN BOLLS, VD25OH   Patient Active Problem List   Diagnosis Date Noted   PAD (peripheral artery disease) 12/16/2023   Obesity, morbid (HCC) 04/29/2023   Osteoarthritis of left knee 09/10/2021   Neck pain 02/29/2020   Coronary artery disease involving coronary bypass graft of native heart with angina pectoris 10/13/2018   AAA (abdominal aortic aneurysm) 02/12/2018   Spondylolisthesis of lumbar region 11/30/2017   Lumbar radiculopathy 11/11/2017    DDD (degenerative disc disease), lumbar 10/10/2017   Type II diabetes mellitus with complication (HCC) 10/10/2015   Low serum testosterone  10/10/2015   Hematuria, gross 10/15/2014   Allergic rhinitis 08/05/2014   Hyperlipidemia associated with type 2 diabetes mellitus (HCC) 08/05/2014   Epididymal pain 08/05/2014   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   Essential (primary) hypertension 08/05/2014   Acid reflux 08/05/2014   History of colon polyps 08/05/2014   Obstructive apnea 08/05/2014   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    Allergies  Allergen Reactions   Topiramate Other (See Comments)    Severe headache   Codeine Nausea Only   Cyclobenzaprine  Other (See Comments)    Abdominal pain    Past Surgical History:  Procedure Laterality Date   COLONOSCOPY  08/16/2014   tubular adenoma   COLONOSCOPY  08/24/2017   one polyp - repeat 5 yrs   CORONARY ANGIOPLASTY N/A 07/2020   CORONARY ANGIOPLASTY WITH STENT PLACEMENT  2013   CORONARY ANGIOPLASTY WITH STENT PLACEMENT  01/2015   2 stents  - post circ and OM2   CORONARY ARTERY BYPASS GRAFT  2004    Social History   Tobacco Use   Smoking status: Former   Smokeless tobacco: Never  Vaping Use   Vaping status: Never Used  Substance Use Topics   Alcohol use: No    Alcohol/week: 0.0 standard drinks of alcohol   Drug use: No     Medication list has been reviewed and updated.  Current Meds  Medication Sig   amLODipine  (NORVASC ) 5 MG tablet TAKE 1 TABLET (5 MG TOTAL) BY MOUTH DAILY.   Aspirin-Acetaminophen-Caffeine 500-325-65 MG PACK Take 1 packet by mouth 2 (two) times daily. Goodys   atorvastatin  (LIPITOR) 40 MG tablet TAKE 1 TABLET BY MOUTH EVERY DAY   Choline Fenofibrate  (FENOFIBRIC ACID ) 135 MG CPDR TAKE 1 CAPSULE BY MOUTH EVERY DAY   gabapentin  (NEURONTIN ) 300 MG capsule TAKE 1 CAPSULE BY MOUTH FOUR TIMES A DAY   isosorbide mononitrate (IMDUR) 30 MG 24 hr  tablet Take 30 mg by mouth daily.   JARDIANCE  25 MG TABS tablet TAKE 1 TABLET (25 MG TOTAL) BY MOUTH DAILY.   lisinopril  (ZESTRIL ) 10 MG tablet TAKE 1 TABLET BY MOUTH EVERY DAY   metFORMIN  (GLUCOPHAGE -XR) 500 MG 24 hr tablet TAKE 1 TABLET BY MOUTH EVERY DAY WITH BREAKFAST   metoprolol  succinate (TOPROL -XL) 100 MG 24 hr tablet TAKE 1 TABLET BY MOUTH EVERY DAY WITH OR IMMEDIATELY FOLLOWING A MEAL   REPATHA SURECLICK 140 MG/ML SOAJ Inject into the skin every 14 (fourteen) days.   tadalafil  (CIALIS ) 20 MG tablet TAKE 1/2 TO 1 TABLET (10-20 MG TOTAL) BY MOUTH EVERY OTHER DAY AS NEEDED FOR ERECTILE DYSFUNCTION   tirzepatide  (MOUNJARO ) 12.5 MG/0.5ML Pen Inject 12.5 mg into the skin once a week.   [DISCONTINUED] albuterol  (VENTOLIN  HFA) 108 (90 Base) MCG/ACT inhaler TAKE 2 PUFFS BY  MOUTH EVERY 6 HOURS AS NEEDED FOR WHEEZE OR SHORTNESS OF BREATH   [DISCONTINUED] clopidogrel  (PLAVIX ) 75 MG tablet TAKE 1 TABLET BY MOUTH EVERY DAY       12/16/2023    7:49 AM 08/26/2023    8:59 AM 08/09/2023    8:52 AM 04/29/2023   11:15 AM  GAD 7 : Generalized Anxiety Score  Nervous, Anxious, on Edge 0 0 0 0  Control/stop worrying 0 0 0 0  Worry too much - different things 0 0 0 0  Trouble relaxing 0 0 0 0  Restless 0 0 0 0  Easily annoyed or irritable 0 0 0 0  Afraid - awful might happen 0 0 0 0  Total GAD 7 Score 0 0 0 0  Anxiety Difficulty Not difficult at all Not difficult at all Not difficult at all Not difficult at all       12/16/2023    7:48 AM 08/26/2023    8:58 AM 08/09/2023    8:52 AM  Depression screen PHQ 2/9  Decreased Interest 0 0 0  Down, Depressed, Hopeless 0 0 0  PHQ - 2 Score 0 0 0  Altered sleeping 0 0 0  Tired, decreased energy 0 0 0  Change in appetite 0 0 0  Feeling bad or failure about yourself  0 0 0  Trouble concentrating 0 0 0  Moving slowly or fidgety/restless 0 0 0  Suicidal thoughts 0 0 0  PHQ-9 Score 0 0 0  Difficult doing work/chores Not difficult at all Not difficult at all  Not difficult at all    BP Readings from Last 3 Encounters:  12/16/23 128/70  08/26/23 124/72  08/09/23 134/68    Physical Exam Vitals and nursing note reviewed.  Constitutional:      Appearance: Normal appearance. He is well-developed.  HENT:     Head: Normocephalic.     Right Ear: Tympanic membrane, ear canal and external ear normal.     Left Ear: Tympanic membrane, ear canal and external ear normal.     Nose: Nose normal.  Eyes:     Conjunctiva/sclera: Conjunctivae normal.     Pupils: Pupils are equal, round, and reactive to light.  Neck:     Thyroid: No thyromegaly.     Vascular: No carotid bruit.  Cardiovascular:     Rate and Rhythm: Normal rate and regular rhythm.     Pulses:          Dorsalis pedis pulses are 1+ on the right side and 1+ on the left side.       Posterior tibial pulses are 1+ on the right side and 1+ on the left side.     Heart sounds: Normal heart sounds.  Pulmonary:     Effort: Pulmonary effort is normal.     Breath sounds: Normal breath sounds. No wheezing.  Chest:  Breasts:    Right: No mass.     Left: No mass.  Abdominal:     General: Bowel sounds are normal.     Palpations: Abdomen is soft.     Tenderness: There is no abdominal tenderness.  Musculoskeletal:        General: Normal range of motion.     Cervical back: Normal range of motion and neck supple.     Right lower leg: No edema.     Left lower leg: No edema.  Lymphadenopathy:     Cervical: No cervical adenopathy.  Skin:    General: Skin  is warm and dry.     Capillary Refill: Capillary refill takes less than 2 seconds.  Neurological:     General: No focal deficit present.     Mental Status: He is alert and oriented to person, place, and time.     Deep Tendon Reflexes: Reflexes are normal and symmetric.  Psychiatric:        Attention and Perception: Attention normal.        Mood and Affect: Mood normal.        Thought Content: Thought content normal.    Diabetic Foot Exam -  Simple   Simple Foot Form Diabetic Foot exam was performed with the following findings: Yes 12/16/2023  8:12 AM  Visual Inspection No deformities, no ulcerations, no other skin breakdown bilaterally: Yes Sensation Testing Intact to touch and monofilament testing bilaterally: Yes Pulse Check Posterior Tibialis and Dorsalis pulse intact bilaterally: Yes Comments      Wt Readings from Last 3 Encounters:  12/16/23 226 lb (102.5 kg)  08/26/23 248 lb (112.5 kg)  08/09/23 253 lb 2 oz (114.8 kg)    BP 128/70   Pulse 69   Ht 5' 11 (1.803 m)   Wt 226 lb (102.5 kg)   SpO2 97%   BMI 31.52 kg/m   Assessment and Plan:  Problem List Items Addressed This Visit       Unprioritized   AAA (abdominal aortic aneurysm) (Chronic)   CTA  2025:  Dilated infrarenal abdominal aorta with mild mural thrombus measuring  up to 3.1 cm, unchanged when compared with 2018 prior. 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       Essential (primary) hypertension (Chronic)   Blood pressure is well controlled on amlodipine , lisinopril  and metoprolol . No medication side effects noted. Plan to continue current medications.       Relevant Orders   CBC with Differential/Platelet   Comprehensive metabolic panel with GFR   TSH   Hyperlipidemia associated with type 2 diabetes mellitus (HCC) (Chronic)   Currently treated with Repatha injections. Lab Results  Component Value Date   LDLCALC 20 12/10/2022         Relevant Orders   Lipid panel   PAD (peripheral artery disease)   Seen by [facility] - no intervention planned due to minimal symptoms       Type II diabetes mellitus with complication (HCC) (Chronic)   Blood sugars have been stable.  No hypoglycemic events since last visit. Currently medications are Jardiance , MTF and Mounjaro . Last visit medical regimen  changes were to change Ozempic  to Mounjaro .  He has lost almost 30 lbs. Lab Results  Component Value Date   HGBA1C 6.7 (H) 08/09/2023          Relevant Orders   Comprehensive metabolic panel with GFR   Hemoglobin A1c   Other Visit Diagnoses       Annual physical exam    -  Primary   up to date on screenings and immunizations     Prostate cancer screening       Relevant Orders   PSA     Atherosclerosis of native coronary artery of native heart without angina pectoris       Relevant Medications   clopidogrel  (PLAVIX ) 75 MG tablet     Encounter for immunization       Relevant Orders  Flu vaccine HIGH DOSE PF(Fluzone Trivalent) (Completed)       Return in about 4 months (around 04/16/2024) for TOC DM, HTN - Dr. Lemon.    Leita HILARIO Adie, MD [facility] [place]

## 2023-12-16 NOTE — Assessment & Plan Note (Signed)
 Currently treated with Repatha injections. Lab Results  Component Value Date   LDLCALC 20 12/10/2022

## 2023-12-16 NOTE — Assessment & Plan Note (Signed)
 Blood pressure is well controlled on amlodipine , lisinopril  and metoprolol . No medication side effects noted. Plan to continue current medications.

## 2023-12-16 NOTE — Assessment & Plan Note (Addendum)
 Seen by [facility] - no intervention planned due to minimal symptoms

## 2023-12-17 LAB — CBC WITH DIFFERENTIAL/PLATELET
Basophils Absolute: 0 x10E3/uL (ref 0.0–0.2)
Basos: 1 %
EOS (ABSOLUTE): 0.2 x10E3/uL (ref 0.0–0.4)
Eos: 3 %
Hematocrit: 50.2 % (ref 37.5–51.0)
Hemoglobin: 16.7 g/dL (ref 13.0–17.7)
Immature Grans (Abs): 0 x10E3/uL (ref 0.0–0.1)
Immature Granulocytes: 0 %
Lymphocytes Absolute: 1.7 x10E3/uL (ref 0.7–3.1)
Lymphs: 25 %
MCH: 30.5 pg (ref 26.6–33.0)
MCHC: 33.3 g/dL (ref 31.5–35.7)
MCV: 92 fL (ref 79–97)
Monocytes Absolute: 0.6 x10E3/uL (ref 0.1–0.9)
Monocytes: 9 %
Neutrophils Absolute: 4.2 x10E3/uL (ref 1.4–7.0)
Neutrophils: 62 %
Platelets: 181 x10E3/uL (ref 150–450)
RBC: 5.47 x10E6/uL (ref 4.14–5.80)
RDW: 13.9 % (ref 11.6–15.4)
WBC: 6.8 x10E3/uL (ref 3.4–10.8)

## 2023-12-17 LAB — COMPREHENSIVE METABOLIC PANEL WITH GFR
ALT: 35 IU/L (ref 0–44)
AST: 25 IU/L (ref 0–40)
Albumin: 4.4 g/dL (ref 3.9–4.9)
Alkaline Phosphatase: 39 IU/L — ABNORMAL LOW (ref 47–123)
BUN/Creatinine Ratio: 24 (ref 10–24)
BUN: 25 mg/dL (ref 8–27)
Bilirubin Total: 0.4 mg/dL (ref 0.0–1.2)
CO2: 20 mmol/L (ref 20–29)
Calcium: 9.5 mg/dL (ref 8.6–10.2)
Chloride: 105 mmol/L (ref 96–106)
Creatinine, Ser: 1.03 mg/dL (ref 0.76–1.27)
Globulin, Total: 1.9 g/dL (ref 1.5–4.5)
Glucose: 99 mg/dL (ref 70–99)
Potassium: 4.7 mmol/L (ref 3.5–5.2)
Sodium: 142 mmol/L (ref 134–144)
Total Protein: 6.3 g/dL (ref 6.0–8.5)
eGFR: 79 mL/min/1.73 (ref >59)

## 2023-12-17 LAB — LIPID PANEL
Chol/HDL Ratio: 2 ratio (ref 0.0–5.0)
Cholesterol, Total: 86 mg/dL — ABNORMAL LOW (ref 100–199)
HDL: 43 mg/dL (ref >39)
LDL Chol Calc (NIH): 28 mg/dL (ref 0–99)
Triglycerides: 66 mg/dL (ref 0–149)
VLDL Cholesterol Cal: 15 mg/dL (ref 5–40)

## 2023-12-17 LAB — HEMOGLOBIN A1C
Est. average glucose Bld gHb Est-mCnc: 117 mg/dL
Hgb A1c MFr Bld: 5.7 % — ABNORMAL HIGH (ref 4.8–5.6)

## 2023-12-17 LAB — TSH: TSH: 1.1 u[IU]/mL (ref 0.450–4.500)

## 2023-12-17 LAB — PSA: Prostate Specific Ag, Serum: 2.8 ng/mL (ref 0.0–4.0)

## 2023-12-19 ENCOUNTER — Telehealth: Payer: Self-pay

## 2023-12-19 ENCOUNTER — Ambulatory Visit: Payer: Self-pay | Admitting: Internal Medicine

## 2023-12-19 DIAGNOSIS — M48062 Spinal stenosis, lumbar region with neurogenic claudication: Secondary | ICD-10-CM | POA: Diagnosis not present

## 2023-12-19 DIAGNOSIS — E1169 Type 2 diabetes mellitus with other specified complication: Secondary | ICD-10-CM

## 2023-12-19 DIAGNOSIS — M48061 Spinal stenosis, lumbar region without neurogenic claudication: Secondary | ICD-10-CM | POA: Diagnosis not present

## 2023-12-19 DIAGNOSIS — Z7982 Long term (current) use of aspirin: Secondary | ICD-10-CM | POA: Diagnosis not present

## 2023-12-19 MED ORDER — ATORVASTATIN CALCIUM 40 MG PO TABS: 40.0000 mg | ORAL_TABLET | Freq: Every day | ORAL | 0 refills | Status: AC

## 2023-12-19 NOTE — Telephone Encounter (Signed)
 Unsure of who told pt he cannot get his medicine until next appt. Refilled medicaine to pharmacy in chart.  Thanks!

## 2023-12-19 NOTE — Telephone Encounter (Signed)
 Copied from CRM #[id number]. Topic: Clinical - Prescription Issue >> Dec 19, 2023 11:57 AM Roselie BROCKS wrote: Reason for CRM: Patient states he is out of atorvastatin  (LIPITOR) 40 MG tablet, and was told it can not be filled until his next appointment on Oct 23rd and needs to know if can take something over the counter.

## 2024-01-16 DIAGNOSIS — M48062 Spinal stenosis, lumbar region with neurogenic claudication: Secondary | ICD-10-CM | POA: Diagnosis not present

## 2024-01-27 ENCOUNTER — Ambulatory Visit (INDEPENDENT_AMBULATORY_CARE_PROVIDER_SITE_OTHER): Admitting: Internal Medicine

## 2024-01-27 ENCOUNTER — Encounter: Payer: Self-pay | Admitting: Internal Medicine

## 2024-01-27 VITALS — BP 127/78 | HR 72 | Ht 71.0 in | Wt 224.0 lb

## 2024-01-27 DIAGNOSIS — K219 Gastro-esophageal reflux disease without esophagitis: Secondary | ICD-10-CM

## 2024-01-27 DIAGNOSIS — R1084 Generalized abdominal pain: Secondary | ICD-10-CM | POA: Diagnosis not present

## 2024-01-27 MED ORDER — ONDANSETRON HCL 4 MG PO TABS: 4.0000 mg | ORAL_TABLET | Freq: Three times a day (TID) | ORAL | 0 refills | Status: AC | PRN

## 2024-01-27 MED ORDER — PANTOPRAZOLE SODIUM 40 MG PO TBEC: 40.0000 mg | DELAYED_RELEASE_TABLET | Freq: Every day | ORAL | 0 refills | Status: DC

## 2024-01-27 NOTE — Assessment & Plan Note (Signed)
 Not currently on medication but with symptoms Will treat with pantoprazole x 30 days Add probiotic capsules

## 2024-01-27 NOTE — Patient Instructions (Signed)
 Take Zofran as needed for nausea  Start Omeprazole daily for reflux  Probiotics - [facility], [place] or [facility] brand

## 2024-01-27 NOTE — Progress Notes (Signed)
 Date:  01/27/2024   Name:  Wyatt Mata   DOB:  Feb 14, 1956   MRN:  [id number]   Chief Complaint: Abdominal Pain (Stomach pains and Thalia. Patient tried Tums. X 1 week. Gassy and belching. )  Abdominal Pain This is a new problem. The current episode started in the past 7 days. The problem occurs daily. The problem has been waxing and waning. The pain is located in the generalized abdominal region. The pain is mild. The quality of the pain is dull, aching and a sensation of fullness. The abdominal pain does not radiate. Associated symptoms include belching, flatus and nausea. Pertinent negatives include no anorexia, fever, vomiting or weight loss.    Review of Systems  Constitutional:  Negative for chills, fatigue, fever and weight loss.  Respiratory:  Negative for chest tightness and shortness of breath.   Cardiovascular:  Negative for chest pain.  Gastrointestinal:  Positive for abdominal pain, flatus and nausea. Negative for anorexia and vomiting.     Lab Results  Component Value Date   NA 142 12/16/2023   K 4.7 12/16/2023   CO2 20 12/16/2023   GLUCOSE 99 12/16/2023   BUN 25 12/16/2023   CREATININE 1.03 12/16/2023   CALCIUM  9.5 12/16/2023   EGFR 79 12/16/2023   GFRNONAA 75 06/20/2020   Lab Results  Component Value Date   CHOL 86 (L) 12/16/2023   HDL 43 12/16/2023   LDLCALC 28 12/16/2023   TRIG 66 12/16/2023   CHOLHDL 2.0 12/16/2023   Lab Results  Component Value Date   TSH 1.100 12/16/2023   Lab Results  Component Value Date   HGBA1C 5.7 (H) 12/16/2023   Lab Results  Component Value Date   WBC 6.8 12/16/2023   HGB 16.7 12/16/2023   HCT 50.2 12/16/2023   MCV 92 12/16/2023   PLT 181 12/16/2023   Lab Results  Component Value Date   ALT 35 12/16/2023   AST 25 12/16/2023   ALKPHOS 39 (L) 12/16/2023   BILITOT 0.4 12/16/2023   No results found for: MARIEN BOLLS, VD25OH   Patient Active Problem List   Diagnosis Date Noted   PAD (peripheral  artery disease) 12/16/2023   Obesity, morbid (HCC) 04/29/2023   Osteoarthritis of left knee 09/10/2021   Neck pain 02/29/2020   Coronary artery disease involving coronary bypass graft of native heart with angina pectoris 10/13/2018   AAA (abdominal aortic aneurysm) 02/12/2018   Spondylolisthesis of lumbar region 11/30/2017   Lumbar radiculopathy 11/11/2017   DDD (degenerative disc disease), lumbar 10/10/2017   Type II diabetes mellitus with complication (HCC) 10/10/2015   Low serum testosterone  10/10/2015   Hematuria, gross 10/15/2014   Allergic rhinitis 08/05/2014   Hyperlipidemia associated with type 2 diabetes mellitus (HCC) 08/05/2014   Epididymal pain 08/05/2014   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   Essential (primary) hypertension 08/05/2014   Acid reflux 08/05/2014   History of colon polyps 08/05/2014   Obstructive apnea 08/05/2014   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    Allergies  Allergen Reactions   Topiramate Other (See Comments)    Severe headache   Codeine Nausea Only   Cyclobenzaprine  Other (See Comments)    Abdominal pain    Past Surgical History:  Procedure Laterality Date   COLONOSCOPY  08/16/2014   tubular adenoma   COLONOSCOPY  08/24/2017   one polyp - repeat 5 yrs   CORONARY ANGIOPLASTY N/A 07/2020   CORONARY ANGIOPLASTY WITH STENT  PLACEMENT  2013   CORONARY ANGIOPLASTY WITH STENT PLACEMENT  01/2015   2 stents  - post circ and OM2   CORONARY ARTERY BYPASS GRAFT  2004    Social History   Tobacco Use   Smoking status: Former   Smokeless tobacco: Never  Vaping Use   Vaping status: Never Used  Substance Use Topics   Alcohol use: No    Alcohol/week: 0.0 standard drinks of alcohol   Drug use: No     Medication list has been reviewed and updated.  Current Meds  Medication Sig   amLODipine  (NORVASC ) 5 MG tablet TAKE 1 TABLET (5 MG TOTAL) BY MOUTH DAILY.    Aspirin-Acetaminophen-Caffeine 500-325-65 MG PACK Take 1 packet by mouth 2 (two) times daily. Goodys   atorvastatin  (LIPITOR) 40 MG tablet Take 1 tablet (40 mg total) by mouth daily.   Choline Fenofibrate  (FENOFIBRIC ACID ) 135 MG CPDR TAKE 1 CAPSULE BY MOUTH EVERY DAY   clopidogrel  (PLAVIX ) 75 MG tablet Take 1 tablet (75 mg total) by mouth daily.   gabapentin  (NEURONTIN ) 300 MG capsule TAKE 1 CAPSULE BY MOUTH FOUR TIMES A DAY   isosorbide mononitrate (IMDUR) 30 MG 24 hr tablet Take 30 mg by mouth daily.   JARDIANCE  25 MG TABS tablet TAKE 1 TABLET (25 MG TOTAL) BY MOUTH DAILY.   lisinopril  (ZESTRIL ) 10 MG tablet TAKE 1 TABLET BY MOUTH EVERY DAY   metFORMIN  (GLUCOPHAGE -XR) 500 MG 24 hr tablet TAKE 1 TABLET BY MOUTH EVERY DAY WITH BREAKFAST   metoprolol  succinate (TOPROL -XL) 100 MG 24 hr tablet TAKE 1 TABLET BY MOUTH EVERY DAY WITH OR IMMEDIATELY FOLLOWING A MEAL   ondansetron (ZOFRAN) 4 MG tablet Take 1 tablet (4 mg total) by mouth every 8 (eight) hours as needed for nausea or vomiting.   pantoprazole (PROTONIX) 40 MG tablet Take 1 tablet (40 mg total) by mouth daily.   REPATHA SURECLICK 140 MG/ML SOAJ Inject into the skin every 14 (fourteen) days.   tadalafil  (CIALIS ) 20 MG tablet TAKE 1/2 TO 1 TABLET (10-20 MG TOTAL) BY MOUTH EVERY OTHER DAY AS NEEDED FOR ERECTILE DYSFUNCTION   tirzepatide  (MOUNJARO ) 12.5 MG/0.5ML Pen Inject 12.5 mg into the skin once a week.       01/27/2024    2:59 PM 12/16/2023    7:49 AM 08/26/2023    8:59 AM 08/09/2023    8:52 AM  GAD 7 : Generalized Anxiety Score  Nervous, Anxious, on Edge 0 0 0 0  Control/stop worrying 0 0 0 0  Worry too much - different things 0 0 0 0  Trouble relaxing 0 0 0 0  Restless 0 0 0 0  Easily annoyed or irritable 0 0 0 0  Afraid - awful might happen 0 0 0 0  Total GAD 7 Score 0 0 0 0  Anxiety Difficulty Not difficult at all Not difficult at all Not difficult at all Not difficult at all       01/27/2024    2:59 PM 12/16/2023    7:48 AM  08/26/2023    8:58 AM  Depression screen PHQ 2/9  Decreased Interest 0 0 0  Down, Depressed, Hopeless 0 0 0  PHQ - 2 Score 0 0 0  Altered sleeping 0 0 0  Tired, decreased energy 0 0 0  Change in appetite 0 0 0  Feeling bad or failure about yourself  0 0 0  Trouble concentrating 0 0 0  Moving slowly or fidgety/restless 0 0 0  Suicidal thoughts 0 0 0  PHQ-9 Score 0 0  0   Difficult doing work/chores Not difficult at all Not difficult at all Not difficult at all     Data saved with a previous flowsheet row definition    BP Readings from Last 3 Encounters:  01/27/24 127/78  12/16/23 128/70  08/26/23 124/72    Physical Exam Vitals and nursing note reviewed.  Constitutional:      General: He is not in acute distress.    Appearance: He is well-developed.  HENT:     Head: Normocephalic and atraumatic.  Cardiovascular:     Rate and Rhythm: Normal rate and regular rhythm.  Pulmonary:     Effort: Pulmonary effort is normal. No respiratory distress.     Breath sounds: No wheezing or rhonchi.  Abdominal:     General: Abdomen is protuberant.     Palpations: Abdomen is soft.     Tenderness: There is abdominal tenderness in the epigastric area and left lower quadrant. There is no right CVA tenderness, left CVA tenderness, guarding or rebound.  Skin:    General: Skin is warm and dry.     Findings: No rash.  Neurological:     Mental Status: He is alert and oriented to person, place, and time.  Psychiatric:        Mood and Affect: Mood normal.        Behavior: Behavior normal.     Wt Readings from Last 3 Encounters:  01/27/24 224 lb (101.6 kg)  12/16/23 226 lb (102.5 kg)  08/26/23 248 lb (112.5 kg)    BP 127/78   Pulse 72   Ht 5' 11 (1.803 m)   Wt 224 lb (101.6 kg)   SpO2 95%   BMI 31.24 kg/m   Assessment and Plan:  Problem List Items Addressed This Visit       Unprioritized   Acid reflux (Chronic)   Not currently on medication but with symptoms Will treat with  pantoprazole x 30 days Add probiotic capsules      Relevant Medications   pantoprazole (PROTONIX) 40 MG tablet   ondansetron (ZOFRAN) 4 MG tablet   Other Visit Diagnoses       Generalized abdominal pain    -  Primary   rule out pancreatitis from Mounjaro  Zofran for nausea; may need to reduce Mounjaro  dose   Relevant Medications   ondansetron (ZOFRAN) 4 MG tablet   Other Relevant Orders   Amylase   Lipase       No follow-ups on file.    Leita HILARIO Adie, MD [facility] [place]

## 2024-01-28 LAB — AMYLASE: Amylase: 82 U/L (ref 31–110)

## 2024-01-28 LAB — LIPASE: Lipase: 63 U/L (ref 13–78)

## 2024-01-29 ENCOUNTER — Ambulatory Visit: Payer: Self-pay | Admitting: Internal Medicine

## 2024-01-30 ENCOUNTER — Other Ambulatory Visit: Payer: Self-pay | Admitting: Internal Medicine

## 2024-01-30 NOTE — Telephone Encounter (Unsigned)
 Copied from CRM [id number]. Topic: Clinical - Medication Refill >> Jan 30, 2024  9:19 AM Lonell PEDLAR wrote: Medication: REPATHA SURECLICK 140 MG/ML SOAJ   Has the patient contacted their pharmacy? Yes   This is the patient's preferred pharmacy:  [facility] [address], [place] - [address] [place] [address] Phone: [phone] Fax: [phone]   Is this the correct pharmacy for this prescription? Yes If no, delete pharmacy and type the correct one.   Has the prescription been filled recently? Yes  Is the patient out of the medication? Yes  Has the patient been seen for an appointment in the last year OR does the patient have an upcoming appointment? Yes  Can we respond through MyChart? No  Agent: Please be advised that Rx refills may take up to 3 business days. We ask that you follow-up with your pharmacy.

## 2024-01-30 NOTE — Telephone Encounter (Signed)
 Copied from CRM [id number]. Topic: Clinical - Medication Refill >> Jan 30, 2024  9:19 AM Lonell PEDLAR wrote: Medication: REPATHA SURECLICK 140 MG/ML SOAJ   Has the patient contacted their pharmacy? Yes   This is the patient's preferred pharmacy:  [facility] [address], [place] - [address] [place] [address] Phone: [phone] Fax: [phone]   Is this the correct pharmacy for this prescription? Yes If no, delete pharmacy and type the correct one.   Has the prescription been filled recently? Yes  Is the patient out of the medication? Yes  Has the patient been seen for an appointment in the last year OR does the patient have an upcoming appointment? Yes  Can we respond through MyChart? No  Agent: Please be advised that Rx refills may take up to 3 business days. We ask that you follow-up with your pharmacy. >> Jan 30, 2024  9:22 AM Lonell PEDLAR wrote: Patient is out of medication and would like this addressed ASAP

## 2024-01-31 ENCOUNTER — Other Ambulatory Visit: Payer: Self-pay

## 2024-01-31 ENCOUNTER — Other Ambulatory Visit: Payer: Self-pay | Admitting: Internal Medicine

## 2024-01-31 ENCOUNTER — Telehealth: Payer: Self-pay | Admitting: Internal Medicine

## 2024-01-31 MED ORDER — TIRZEPATIDE 12.5 MG/0.5ML ~~LOC~~ SOAJ: 12.5000 mg | SUBCUTANEOUS | 0 refills | Status: AC

## 2024-01-31 MED ORDER — REPATHA SURECLICK 140 MG/ML ~~LOC~~ SOAJ: 140.0000 mg | SUBCUTANEOUS | 0 refills | Status: DC

## 2024-01-31 NOTE — Telephone Encounter (Signed)
 Requested medication (s) are due for refill today - unsure  Requested medication (s) are on the active medication list -yes  Future visit scheduled -yes  Last refill: 07/24/21  Notes to clinic: Duplicate request- historical medication   Requested Prescriptions  Pending Prescriptions Disp Refills   REPATHA SURECLICK 140 MG/ML SOAJ 2 mL     Sig: Inject into the skin every 14 (fourteen) days.     Cardiovascular: PCSK9 Inhibitors Passed - 01/31/2024  3:59 PM      Passed - Valid encounter within last 12 months    Recent Outpatient Visits           4 days ago Generalized abdominal pain   [place] [facility] at [facility], Leita DEL, MD   1 month ago Annual physical exam   [facility] at [facility], Leita DEL, MD   5 months ago Essential (primary) hypertension   [place] [facility] at [facility], Leita DEL, MD   5 months ago Essential (primary) hypertension   [place] [facility] at [facility], Leita DEL, MD   8 months ago Degeneration of intervertebral disc of lumbar region with lower extremity pain   [place] [facility] at [facility], Leita DEL, MD              Passed - Lipid Panel completed within the last 12 months    Cholesterol, Total  Date Value Ref Range Status  12/16/2023 86 (L) 100 - 199 mg/dL Final   LDL Chol Calc (NIH)  Date Value Ref Range Status  12/16/2023 28 0 - 99 mg/dL Final   HDL  Date Value Ref Range Status  12/16/2023 43 >39 mg/dL Final   Triglycerides  Date Value Ref Range Status  12/16/2023 66 0 - 149 mg/dL Final            Requested Prescriptions  Pending Prescriptions Disp Refills   REPATHA SURECLICK 140 MG/ML SOAJ 2 mL     Sig: Inject into the skin every 14 (fourteen) days.     Cardiovascular: PCSK9 Inhibitors Passed - 01/31/2024  3:59 PM      Passed  - Valid encounter within last 12 months    Recent Outpatient Visits           4 days ago Generalized abdominal pain   [place] [facility] at [facility], Leita DEL, MD   1 month ago Annual physical exam   [facility] at [facility], Leita DEL, MD   5 months ago Essential (primary) hypertension   [place] [facility] at [facility], Leita DEL, MD   5 months ago Essential (primary) hypertension   [place] [facility] at [facility], Leita DEL, MD   8 months ago Degeneration of intervertebral disc of lumbar region with lower extremity pain   [place] [facility] at [facility], Leita DEL, MD              Passed - Lipid Panel completed within the last 12 months    Cholesterol, Total  Date Value Ref Range Status  12/16/2023 86 (L) 100 - 199 mg/dL Final   LDL Chol Calc (NIH)  Date Value Ref Range Status  12/16/2023 28 0 -  99 mg/dL Final   HDL  Date Value Ref Range Status  12/16/2023 43 >39 mg/dL Final   Triglycerides  Date Value Ref Range Status  12/16/2023 66 0 - 149 mg/dL Final

## 2024-01-31 NOTE — Telephone Encounter (Signed)
 Refill sent.

## 2024-01-31 NOTE — Telephone Encounter (Signed)
 Copied from CRM [id number]. Topic: Clinical - Medication Refill >> Jan 31, 2024  8:36 AM Donna BRAVO wrote: Medication: tirzepatide  (MOUNJARO ) 12.5 MG/0.5ML Pen  Has the patient contacted their pharmacy? Yes Pharmacy stated to call provider  This is the patient's preferred pharmacy:   [facility] [address], [place] - [address] [place] [address] Phone: [phone] Fax: [phone]  Is this the correct pharmacy for this prescription? Yes If no, delete pharmacy and type the correct one.   Has the prescription been filled recently? Yes  Is the patient out of the medication? Yes  Has the patient been seen for an appointment in the last year OR does the patient have an upcoming appointment? Yes  Can we respond through MyChart? No  Agent: Please be advised that Rx refills may take up to 3 business days. We ask that you follow-up with your pharmacy.

## 2024-01-31 NOTE — Telephone Encounter (Signed)
 Requested medication (s) are due for refill today - unsure  Requested medication (s) are on the active medication list -yes  Future visit scheduled -yes  Last refill: 07/20/21  Notes to clinic: listed as historical medication- sent for review   Requested Prescriptions  Pending Prescriptions Disp Refills   REPATHA SURECLICK 140 MG/ML SOAJ 2 mL     Sig: Inject into the skin every 14 (fourteen) days.     Cardiovascular: PCSK9 Inhibitors Passed - 01/31/2024  3:58 PM      Passed - Valid encounter within last 12 months    Recent Outpatient Visits           4 days ago Generalized abdominal pain   [place] [facility] at [facility], Leita DEL, MD   1 month ago Annual physical exam   [facility] at [facility], Leita DEL, MD   5 months ago Essential (primary) hypertension   [place] [facility] at [facility], Leita DEL, MD   5 months ago Essential (primary) hypertension   [place] [facility] at [facility], Leita DEL, MD   8 months ago Degeneration of intervertebral disc of lumbar region with lower extremity pain   [place] [facility] at [facility], Leita DEL, MD              Passed - Lipid Panel completed within the last 12 months    Cholesterol, Total  Date Value Ref Range Status  12/16/2023 86 (L) 100 - 199 mg/dL Final   LDL Chol Calc (NIH)  Date Value Ref Range Status  12/16/2023 28 0 - 99 mg/dL Final   HDL  Date Value Ref Range Status  12/16/2023 43 >39 mg/dL Final   Triglycerides  Date Value Ref Range Status  12/16/2023 66 0 - 149 mg/dL Final            Requested Prescriptions  Pending Prescriptions Disp Refills   REPATHA SURECLICK 140 MG/ML SOAJ 2 mL     Sig: Inject into the skin every 14 (fourteen) days.     Cardiovascular: PCSK9 Inhibitors Passed - 01/31/2024  3:58 PM       Passed - Valid encounter within last 12 months    Recent Outpatient Visits           4 days ago Generalized abdominal pain   [place] [facility] at [facility], Leita DEL, MD   1 month ago Annual physical exam   [facility] at [facility], Leita DEL, MD   5 months ago Essential (primary) hypertension   [place] [facility] at [facility], Leita DEL, MD   5 months ago Essential (primary) hypertension   [place] [facility] at [facility], Leita DEL, MD   8 months ago Degeneration of intervertebral disc of lumbar region with lower extremity pain   [place] [facility] at [facility], Leita DEL, MD              Passed - Lipid Panel completed within the last 12 months    Cholesterol, Total  Date Value Ref Range Status  12/16/2023 86 (L) 100 - 199 mg/dL Final   LDL Chol Calc (NIH)  Date Value Ref Range Status  12/16/2023 28 0 - 99 mg/dL Final   HDL  Date Value Ref Range Status  12/16/2023 43 >39 mg/dL Final   Triglycerides  Date Value Ref Range Status  12/16/2023 66 0 - 149 mg/dL Final

## 2024-01-31 NOTE — Telephone Encounter (Signed)
 Already requested in a separate refill encounter 01/31/24, refill pending.

## 2024-01-31 NOTE — Telephone Encounter (Signed)
 Pt called and stated that he has been out for over a week now and needs his medication. Please send in refill or call pt and advise.

## 2024-02-01 ENCOUNTER — Other Ambulatory Visit: Payer: Self-pay | Admitting: Internal Medicine

## 2024-02-01 DIAGNOSIS — I10 Essential (primary) hypertension: Secondary | ICD-10-CM

## 2024-02-01 DIAGNOSIS — E1169 Type 2 diabetes mellitus with other specified complication: Secondary | ICD-10-CM

## 2024-02-02 NOTE — Telephone Encounter (Signed)
 Requested Prescriptions  Pending Prescriptions Disp Refills   lisinopril  (ZESTRIL ) 10 MG tablet [Pharmacy Med Name: LISINOPRIL  10 MG TABLET] 90 tablet 1    Sig: TAKE 1 TABLET BY MOUTH EVERY DAY     Cardiovascular:  ACE Inhibitors Passed - 02/02/2024  3:32 PM      Passed - Cr in normal range and within 180 days    Creatinine, Ser  Date Value Ref Range Status  12/16/2023 1.03 0.76 - 1.27 mg/dL Final         Passed - K in normal range and within 180 days    Potassium  Date Value Ref Range Status  12/16/2023 4.7 3.5 - 5.2 mmol/L Final         Passed - Patient is not pregnant      Passed - Last BP in normal range    BP Readings from Last 1 Encounters:  01/27/24 127/78         Passed - Valid encounter within last 6 months    Recent Outpatient Visits           6 days ago Generalized abdominal pain   [place] [facility] at [facility], Leita DEL, MD   1 month ago Annual physical exam   [place] [facility] at [facility], Leita DEL, MD   5 months ago Essential (primary) hypertension   [place] [facility] at [facility], Leita DEL, MD   5 months ago Essential (primary) hypertension   [place] [facility] at [facility], Leita DEL, MD   8 months ago Degeneration of intervertebral disc of lumbar region with lower extremity pain   [place] [facility] at [facility], Leita DEL, MD               metFORMIN  (GLUCOPHAGE -XR) 500 MG 24 hr tablet [Pharmacy Med Name: METFORMIN  HCL ER 500 MG TABLET] 90 tablet 1    Sig: TAKE 1 TABLET BY MOUTH EVERY DAY WITH BREAKFAST     Endocrinology:  Diabetes - Biguanides Failed - 02/02/2024  3:32 PM      Failed - B12 Level in normal range and within 720 days    Vitamin B-12  Date Value Ref Range Status  12/02/2020 953 232 - 1,245 pg/mL Final         Passed - Cr in normal  range and within 360 days    Creatinine, Ser  Date Value Ref Range Status  12/16/2023 1.03 0.76 - 1.27 mg/dL Final         Passed - HBA1C is between 0 and 7.9 and within 180 days    Hgb A1c MFr Bld  Date Value Ref Range Status  12/16/2023 5.7 (H) 4.8 - 5.6 % Final    Comment:             Prediabetes: 5.7 - 6.4          Diabetes: >6.4          Glycemic control for adults with diabetes: <7.0          Passed - eGFR in normal range and within 360 days    GFR calc Af Amer  Date Value Ref Range Status  02/29/2020 82 >59 mL/min/1.73 Final    Comment:    **In accordance with recommendations from the NKF-ASN Task force,**   [facility] is in the process of  updating its eGFR calculation to the   2021 CKD-EPI creatinine equation that estimates kidney function   without a race variable.    GFR calc non Af Amer  Date Value Ref Range Status  06/20/2020 75  Final   eGFR  Date Value Ref Range Status  12/16/2023 79 >59 mL/min/1.73 Final         Passed - Valid encounter within last 6 months    Recent Outpatient Visits           6 days ago Generalized abdominal pain   [place] [facility] at [facility], Leita DEL, MD   1 month ago Annual physical exam   [facility] at [facility], Leita DEL, MD   5 months ago Essential (primary) hypertension   [place] [facility] at [facility], Leita DEL, MD   5 months ago Essential (primary) hypertension   [place] [facility] at [facility], Leita DEL, MD   8 months ago Degeneration of intervertebral disc of lumbar region with lower extremity pain   [place] [facility] at [facility], Leita DEL, MD              Passed - CBC within normal limits and completed in the last 12 months    WBC  Date Value Ref Range Status  12/16/2023 6.8 3.4 - 10.8 x10E3/uL Final   RBC   Date Value Ref Range Status  12/16/2023 5.47 4.14 - 5.80 x10E6/uL Final  10/09/2014 100 (A) 4.69 - 6.13 M/uL Preliminary   Hemoglobin  Date Value Ref Range Status  12/16/2023 16.7 13.0 - 17.7 g/dL Final   Hematocrit  Date Value Ref Range Status  12/16/2023 50.2 37.5 - 51.0 % Final   MCHC  Date Value Ref Range Status  12/16/2023 33.3 31.5 - 35.7 g/dL Final   [facility]  Date Value Ref Range Status  12/16/2023 30.5 26.6 - 33.0 pg Final   MCV  Date Value Ref Range Status  12/16/2023 92 79 - 97 fL Final   No results found for: PLTCOUNTKUC, LABPLAT, POCPLA RDW  Date Value Ref Range Status  12/16/2023 13.9 11.6 - 15.4 % Final          amLODipine  (NORVASC ) 5 MG tablet [Pharmacy Med Name: AMLODIPINE  BESYLATE 5 MG TAB] 90 tablet 1    Sig: TAKE 1 TABLET (5 MG TOTAL) BY MOUTH DAILY.     Cardiovascular: Calcium  Channel Blockers 2 Passed - 02/02/2024  3:32 PM      Passed - Last BP in normal range    BP Readings from Last 1 Encounters:  01/27/24 127/78         Passed - Last Heart Rate in normal range    Pulse Readings from Last 1 Encounters:  01/27/24 72         Passed - Valid encounter within last 6 months    Recent Outpatient Visits           6 days ago Generalized abdominal pain   [place] [facility] at [facility], Leita DEL, MD   1 month ago Annual physical exam   [facility] at [facility], Leita DEL, MD   5 months ago Essential (primary) hypertension   [facility] at [facility], Leita DEL,  MD   5 months ago Essential (primary) hypertension   [place] [facility] at [facility], Leita DEL, MD   8 months ago Degeneration of intervertebral disc of lumbar region with lower extremity pain   [place] [facility] at [facility], Leita DEL, MD               Ch[place] Fenofibrate   (FENOFIBRIC ACID ) 135 MG CPDR [Pharmacy Med Name: FENOFIBRIC ACID  DR 135 MG CAP] 90 capsule 1    Sig: TAKE 1 CAPSULE BY MOUTH EVERY DAY     Cardiovascular:  Antilipid - Fibric Acid Derivatives Failed - 02/02/2024  3:32 PM      Failed - Lipid Panel in normal range within the last 12 months    Cholesterol, Total  Date Value Ref Range Status  12/16/2023 86 (L) 100 - 199 mg/dL Final   LDL Chol Calc (NIH)  Date Value Ref Range Status  12/16/2023 28 0 - 99 mg/dL Final   HDL  Date Value Ref Range Status  12/16/2023 43 >39 mg/dL Final   Triglycerides  Date Value Ref Range Status  12/16/2023 66 0 - 149 mg/dL Final         Passed - ALT in normal range and within 360 days    ALT  Date Value Ref Range Status  12/16/2023 35 0 - 44 IU/L Final         Passed - AST in normal range and within 360 days    AST  Date Value Ref Range Status  12/16/2023 25 0 - 40 IU/L Final         Passed - Cr in normal range and within 360 days    Creatinine, Ser  Date Value Ref Range Status  12/16/2023 1.03 0.76 - 1.27 mg/dL Final         Passed - HGB in normal range and within 360 days    Hemoglobin  Date Value Ref Range Status  12/16/2023 16.7 13.0 - 17.7 g/dL Final         Passed - HCT in normal range and within 360 days    Hematocrit  Date Value Ref Range Status  12/16/2023 50.2 37.5 - 51.0 % Final         Passed - PLT in normal range and within 360 days    Platelets  Date Value Ref Range Status  12/16/2023 181 150 - 450 x10E3/uL Final         Passed - WBC in normal range and within 360 days    WBC  Date Value Ref Range Status  12/16/2023 6.8 3.4 - 10.8 x10E3/uL Final         Passed - eGFR is 30 or above and within 360 days    GFR calc Af Amer  Date Value Ref Range Status  02/29/2020 82 >59 mL/min/1.73 Final    Comment:    **In accordance with recommendations from the NKF-ASN Task force,**   [facility] is in the process of updating its eGFR calculation to the   2021 CKD-EPI  creatinine equation that estimates kidney function   without a race variable.    GFR calc non Af Amer  Date Value Ref Range Status  06/20/2020 75  Final   eGFR  Date Value Ref Range Status  12/16/2023 79 >59 mL/min/1.73 Final         Passed - Valid encounter within last 12 months    Recent  Outpatient Visits           6 days ago Generalized abdominal pain   [place] [facility] at [facility], Leita DEL, MD   1 month ago Annual physical exam   [facility] at [facility], Leita DEL, MD   5 months ago Essential (primary) hypertension   [place] [facility] at [facility], Leita DEL, MD   5 months ago Essential (primary) hypertension   [place] [facility] Medicine at [facility], Leita DEL, MD   8 months ago Degeneration of intervertebral disc of lumbar region with lower extremity pain   [place] [facility] Medicine at [facility], Leita DEL, MD               metoprolol  succinate (TOPROL -XL) 100 MG 24 hr tablet [Pharmacy Med Name: METOPROLOL  SUCC ER 100 MG TAB] 90 tablet 1    Sig: TAKE 1 TABLET BY MOUTH EVERY DAY WITH OR IMMEDIATELY FOLLOWING A MEAL     Cardiovascular:  Beta Blockers Passed - 02/02/2024  3:32 PM      Passed - Last BP in normal range    BP Readings from Last 1 Encounters:  01/27/24 127/78         Passed - Last Heart Rate in normal range    Pulse Readings from Last 1 Encounters:  01/27/24 72         Passed - Valid encounter within last 6 months    Recent Outpatient Visits           6 days ago Generalized abdominal pain   [place] [facility] at [facility], Leita DEL, MD   1 month ago Annual physical exam   [facility] at [facility], Leita DEL, MD   5 months ago Essential (primary) hypertension   [place]  [facility] Care & Sports Medicine at [facility], Leita DEL, MD   5 months ago Essential (primary) hypertension    Primary Care & Sports Medicine at [facility], Leita DEL, MD   8 months ago Degeneration of intervertebral disc of lumbar region with lower extremity pain   [facility] Medicine at [facility], Leita DEL, MD

## 2024-02-18 ENCOUNTER — Other Ambulatory Visit: Payer: Self-pay | Admitting: Internal Medicine

## 2024-02-18 DIAGNOSIS — K219 Gastro-esophageal reflux disease without esophagitis: Secondary | ICD-10-CM

## 2024-02-22 NOTE — Telephone Encounter (Signed)
 Requested Prescriptions  Pending Prescriptions Disp Refills   pantoprazole  (PROTONIX ) 40 MG tablet [Pharmacy Med Name: PANTOPRAZOLE  SOD DR 40 MG TAB] 90 tablet 0    Sig: TAKE 1 TABLET BY MOUTH EVERY DAY     Gastroenterology: Proton Pump Inhibitors Passed - 02/22/2024 12:25 PM      Passed - Valid encounter within last 12 months    Recent Outpatient Visits           3 weeks ago Generalized abdominal pain   [place] [facility] at [facility], Leita DEL, MD   2 months ago Annual physical exam   [facility] at [facility], Leita DEL, MD   6 months ago Essential (primary) hypertension   [place] [facility] Medicine at [facility], Leita DEL, MD   6 months ago Essential (primary) hypertension   [place] [facility] Medicine at [facility], Leita DEL, MD   9 months ago Degeneration of intervertebral disc of lumbar region with lower extremity pain   [facility] at [facility], Leita DEL, MD

## 2024-03-14 ENCOUNTER — Other Ambulatory Visit: Payer: Self-pay | Admitting: Internal Medicine

## 2024-03-14 DIAGNOSIS — E118 Type 2 diabetes mellitus with unspecified complications: Secondary | ICD-10-CM

## 2024-03-14 NOTE — Telephone Encounter (Signed)
 Requested Prescriptions  Pending Prescriptions Disp Refills   JARDIANCE  25 MG TABS tablet [Pharmacy Med Name: JARDIANCE  25 MG TABLET] 90 tablet 0    Sig: TAKE 1 TABLET (25 MG TOTAL) BY MOUTH DAILY.     Endocrinology:  Diabetes - SGLT2 Inhibitors Passed - 03/14/2024  4:27 PM      Passed - Cr in normal range and within 360 days    Creatinine, Ser  Date Value Ref Range Status  12/16/2023 1.03 0.76 - 1.27 mg/dL Final         Passed - HBA1C is between 0 and 7.9 and within 180 days    Hgb A1c MFr Bld  Date Value Ref Range Status  12/16/2023 5.7 (H) 4.8 - 5.6 % Final    Comment:             Prediabetes: 5.7 - 6.4          Diabetes: >6.4          Glycemic control for adults with diabetes: <7.0          Passed - eGFR in normal range and within 360 days    GFR calc Af Amer  Date Value Ref Range Status  02/29/2020 82 >59 mL/min/1.73 Final    Comment:    **In accordance with recommendations from the NKF-ASN Task force,**   [facility] is in the process of updating its eGFR calculation to the   2021 CKD-EPI creatinine equation that estimates kidney function   without a race variable.    GFR calc non Af Amer  Date Value Ref Range Status  06/20/2020 75  Final   eGFR  Date Value Ref Range Status  12/16/2023 79 >59 mL/min/1.73 Final         Passed - Valid encounter within last 6 months    Recent Outpatient Visits           1 month ago Generalized abdominal pain   [place] [facility] at [facility], Leita DEL, MD   2 months ago Annual physical exam   [facility] Primary Care & Sports Medicine at [facility], Leita DEL, MD   6 months ago Essential (primary) hypertension   [place] [facility] Medicine at [facility], Leita DEL, MD   7 months ago Essential (primary) hypertension   [place] [facility] Medicine at [facility], Leita DEL, MD   10 months ago Degeneration of intervertebral  disc of lumbar region with lower extremity pain   [facility] Care & Sports Medicine at [facility], Leita DEL, MD

## 2024-04-20 ENCOUNTER — Ambulatory Visit: Admitting: Student

## 2024-04-20 ENCOUNTER — Encounter: Payer: Self-pay | Admitting: Student

## 2024-04-20 VITALS — BP 121/78 | HR 71 | Temp 97.5°F | Ht 71.0 in | Wt 211.5 lb

## 2024-04-20 DIAGNOSIS — Z7984 Long term (current) use of oral hypoglycemic drugs: Secondary | ICD-10-CM | POA: Diagnosis not present

## 2024-04-20 DIAGNOSIS — N529 Male erectile dysfunction, unspecified: Secondary | ICD-10-CM | POA: Diagnosis not present

## 2024-04-20 DIAGNOSIS — I10 Essential (primary) hypertension: Secondary | ICD-10-CM | POA: Diagnosis not present

## 2024-04-20 DIAGNOSIS — M7500 Adhesive capsulitis of unspecified shoulder: Secondary | ICD-10-CM | POA: Insufficient documentation

## 2024-04-20 DIAGNOSIS — I25709 Atherosclerosis of coronary artery bypass graft(s), unspecified, with unspecified angina pectoris: Secondary | ICD-10-CM

## 2024-04-20 DIAGNOSIS — E785 Hyperlipidemia, unspecified: Secondary | ICD-10-CM | POA: Diagnosis not present

## 2024-04-20 DIAGNOSIS — E1169 Type 2 diabetes mellitus with other specified complication: Secondary | ICD-10-CM | POA: Diagnosis not present

## 2024-04-20 DIAGNOSIS — Z7985 Long-term (current) use of injectable non-insulin antidiabetic drugs: Secondary | ICD-10-CM | POA: Diagnosis not present

## 2024-04-20 DIAGNOSIS — E118 Type 2 diabetes mellitus with unspecified complications: Secondary | ICD-10-CM | POA: Diagnosis not present

## 2024-04-20 DIAGNOSIS — M7502 Adhesive capsulitis of left shoulder: Secondary | ICD-10-CM

## 2024-04-20 LAB — POCT GLYCOSYLATED HEMOGLOBIN (HGB A1C): Hemoglobin A1C: 5.4 % (ref 4.0–5.6)

## 2024-04-20 MED ORDER — METOPROLOL SUCCINATE ER 100 MG PO TB24: 100.0000 mg | ORAL_TABLET | Freq: Every day | ORAL | 1 refills | Status: AC

## 2024-04-20 NOTE — Assessment & Plan Note (Addendum)
 Well controlled on repatha , statin, and fenofibrate

## 2024-04-20 NOTE — Assessment & Plan Note (Signed)
 Tightness for the last few months, has stopped working out due to this. Recommended home exercise, follow up with ortho if not helping may benefit from corticosteroid injection.

## 2024-04-20 NOTE — Assessment & Plan Note (Addendum)
 Does not check his glucoses  at home. A1c today 5.4%, weight is down 10 lbs since last night. Currently on metformin  500mg  daily, Jardiance  25 mg daily, and mounjaro  12.5 mg weekly. Reports he rarely feels lightheaded when he has not eaten, improved with eating declined to check his glucose then. Recommend carrying snack and eating regular meals. May need to decrease his medications if this is happening more frequently. Continue current medications for now. Follow up 4 months.

## 2024-04-20 NOTE — Assessment & Plan Note (Signed)
 Feels cialis  not helping with discontinue given he is also on nitrates for heart

## 2024-04-20 NOTE — Assessment & Plan Note (Signed)
 Well controlled on amlodipine  5 mg daily, lisinopril  10 mg daily, and metoprolol  100 mg daily. Is out of BB will refill. Continue current medications

## 2024-04-20 NOTE — Assessment & Plan Note (Signed)
 Currently on mounjaro  with continue weight loss 15 pound weight loss since visit 4 months ago. Continue mounjaro  for diabetes, continue exercise and healthy diet.

## 2024-04-20 NOTE — Progress Notes (Signed)
 "  Established Patient Office Visit  Subjective   Patient ID: Wyatt Mata, male    DOB: Mar 10, 1956  Age: 69 y.o. MRN: [id number]  Chief Complaint  Patient presents with   Diabetes   Hypertension   Shoulder Pain    Sore in both shoulder joints, joint pain in left arm, hard to sleep on his side, on going for couple of months    Wyatt Mata is a 69 y.o. person with medical hx listed below who presents today for transfer of care. Previously seeing Dr. Justus. Please refer to problem based charting for further details and assessment and plan of current problem and chronic medical conditions.   Patient Active Problem List   Diagnosis Date Noted   Adhesive capsulitis 04/20/2024   PAD (peripheral artery disease) 12/16/2023   Obesity, morbid (HCC) 04/29/2023   Osteoarthritis of left knee 09/10/2021   Neck pain 02/29/2020   Coronary artery disease involving coronary bypass graft of native heart with angina pectoris 10/13/2018   AAA (abdominal aortic aneurysm) 02/12/2018   Spondylolisthesis of lumbar region 11/30/2017   Lumbar radiculopathy 11/11/2017   DDD (degenerative disc disease), lumbar 10/10/2017   Type II diabetes mellitus with complication (HCC) 10/10/2015   Low serum testosterone  10/10/2015   Hematuria, gross 10/15/2014   Allergic rhinitis 08/05/2014   Hyperlipidemia associated with type 2 diabetes mellitus (HCC) 08/05/2014   Epididymal pain 08/05/2014   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   Essential (primary) hypertension 08/05/2014   Acid reflux 08/05/2014   History of colon polyps 08/05/2014   Obstructive apnea 08/05/2014   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   Aspirin-Acetaminophen-Caffeine 500-325-65 MG PACK  Take 1 packet by mouth 2 (two) times daily. Goodys (Patient taking differently: Take 1 packet by mouth as needed. Goodys)   atorvastatin  (LIPITOR) 40 MG tablet Take 1 tablet (40 mg total) by mouth daily.   Choline Fenofibrate  (FENOFIBRIC ACID ) 135 MG CPDR TAKE 1 CAPSULE BY MOUTH EVERY DAY   clopidogrel  (PLAVIX ) 75 MG tablet Take 1 tablet (75 mg total) by mouth daily.   empagliflozin  (JARDIANCE ) 25 MG TABS tablet TAKE 1 TABLET (25 MG TOTAL) BY MOUTH DAILY.   gabapentin  (NEURONTIN ) 300 MG capsule TAKE 1 CAPSULE BY MOUTH FOUR TIMES A DAY   isosorbide mononitrate (IMDUR) 30 MG 24 hr tablet Take 30 mg by mouth daily.   lisinopril  (ZESTRIL ) 10 MG tablet TAKE 1 TABLET BY MOUTH EVERY DAY   metFORMIN  (GLUCOPHAGE -XR) 500 MG 24 hr tablet TAKE 1 TABLET BY MOUTH EVERY DAY WITH BREAKFAST   ondansetron  (ZOFRAN ) 4 MG tablet Take 1 tablet (4 mg total) by mouth every 8 (eight) hours as needed for nausea or vomiting.   pantoprazole  (PROTONIX ) 40 MG tablet TAKE 1 TABLET BY MOUTH EVERY DAY   REPATHA  SURECLICK 140 MG/ML SOAJ Inject 140 mg into the skin every 14 (fourteen) days.   tirzepatide  (MOUNJARO ) 12.5 MG/0.5ML Pen Inject 12.5 mg into the skin once a week.   [DISCONTINUED] tadalafil  (CIALIS ) 20 MG tablet TAKE 1/2 TO 1 TABLET (10-20 MG TOTAL) BY MOUTH EVERY OTHER DAY AS NEEDED FOR ERECTILE DYSFUNCTION   metoprolol  succinate (TOPROL -XL) 100 MG 24 hr tablet Take 1 tablet (100 mg total) by mouth  daily. Take with or immediately following a meal.   [DISCONTINUED] metoprolol  succinate (TOPROL -XL) 100 MG 24 hr tablet TAKE 1 TABLET BY MOUTH EVERY DAY WITH OR IMMEDIATELY FOLLOWING A MEAL (Patient not taking: Reported on 04/20/2024)   [DISCONTINUED] REPATHA  SURECLICK 140 MG/ML SOAJ Inject 140 mg into the skin every 14 (fourteen) days.   No facility-administered encounter medications on file as of 04/20/2024.    BP 121/78   Pulse 71   Temp (!) 97.5 F (36.4 C) (Oral)   Ht 5' 11 (1.803 m)   Wt 211 lb 8 oz (95.9 kg)    SpO2 98%   BMI 29.50 kg/m  BP Readings from Last 3 Encounters:  04/20/24 121/78  01/27/24 127/78  12/16/23 128/70    Physical Exam Constitutional:      Appearance: Normal appearance.  HENT:     Mouth/Throat:     Mouth: Mucous membranes are moist.     Pharynx: Oropharynx is clear.  Cardiovascular:     Rate and Rhythm: Normal rate and regular rhythm.  Pulmonary:     Effort: Pulmonary effort is normal.     Breath sounds: No rhonchi or rales.  Abdominal:     General: Abdomen is flat. Bowel sounds are normal. There is no distension.     Palpations: Abdomen is soft.     Tenderness: There is no abdominal tenderness.  Musculoskeletal:     Right lower leg: No edema.     Left lower leg: No edema.     Comments: Tenderness of the L shoulder with abduction, internal and external rotation. TTP of the L AC joint  Skin:    General: Skin is warm and dry.     Capillary Refill: Capillary refill takes less than 2 seconds.  Neurological:     General: No focal deficit present.     Mental Status: He is alert and oriented to person, place, and time.  Psychiatric:        Mood and Affect: Mood normal.        Behavior: Behavior normal.        04/20/2024   10:34 AM 01/27/2024    2:59 PM 12/16/2023    7:48 AM  Depression screen PHQ 2/9  Decreased Interest 0 0 0  Down, Depressed, Hopeless 0 0 0  PHQ - 2 Score 0 0 0  Altered sleeping  0 0  Tired, decreased energy  0 0  Change in appetite  0 0  Feeling bad or failure about yourself   0 0  Trouble concentrating  0 0  Moving slowly or fidgety/restless  0 0  Suicidal thoughts  0 0  PHQ-9 Score  0 0   Difficult doing work/chores  Not difficult at all Not difficult at all     Data saved with a previous flowsheet row definition       04/20/2024   10:34 AM 01/27/2024    2:59 PM 12/16/2023    7:49 AM 08/26/2023    8:59 AM  GAD 7 : Generalized Anxiety Score  Nervous, Anxious, on Edge 0 0  0  0   Control/stop worrying 0 0  0  0   Worry too much  - different things  0  0  0   Trouble relaxing  0  0  0   Restless  0  0  0   Easily annoyed or irritable  0  0  0   Afraid - awful might happen  0  0  0   Total GAD 7 Score  0 0 0  Anxiety Difficulty  Not difficult at all Not difficult at all Not difficult at all     Data saved with a previous flowsheet row definition    Results for orders placed or performed in visit on 04/20/24  POCT HgB A1C  Result Value Ref Range   Hemoglobin A1C 5.4 4.0 - 5.6 %    Last CBC Lab Results  Component Value Date   WBC 6.8 12/16/2023   HGB 16.7 12/16/2023   HCT 50.2 12/16/2023   MCV 92 12/16/2023   MCH 30.5 12/16/2023   RDW 13.9 12/16/2023   PLT 181 12/16/2023   Last metabolic panel Lab Results  Component Value Date   GLUCOSE 99 12/16/2023   NA 142 12/16/2023   K 4.7 12/16/2023   CL 105 12/16/2023   CO2 20 12/16/2023   BUN 25 12/16/2023   CREATININE 1.03 12/16/2023   EGFR 79 12/16/2023   CALCIUM  9.5 12/16/2023   PROT 6.3 12/16/2023   ALBUMIN 4.4 12/16/2023   LABGLOB 1.9 12/16/2023   AGRATIO 2.2 12/07/2021   BILITOT 0.4 12/16/2023   ALKPHOS 39 (L) 12/16/2023   AST 25 12/16/2023   ALT 35 12/16/2023   Last lipids Lab Results  Component Value Date   CHOL 86 (L) 12/16/2023   HDL 43 12/16/2023   LDLCALC 28 12/16/2023   TRIG 66 12/16/2023   CHOLHDL 2.0 12/16/2023   Last hemoglobin A1c Lab Results  Component Value Date   HGBA1C 5.4 04/20/2024   Last thyroid  functions Lab Results  Component Value Date   TSH 1.100 12/16/2023      The ASCVD Risk score (Arnett DK, et al., 2019) failed to calculate for the following reasons:   The valid total cholesterol range is 130 to 320 mg/dL    Assessment & Plan:  Type II diabetes mellitus with complication [facility]) Assessment & Plan: Does not check his glucoses  at home. A1c today, weight is down 10 lbs since last night. Currently on metformin  500mg  daily, Jardiance  25 mg daily, and mounjaro  12.5 mg weekly.    Orders: -     POCT  glycosylated hemoglobin (Hb A1C)  Essential (primary) hypertension Assessment & Plan: Well controlled on amlodipine  5 mg daily, lisinopril  10 mg daily, and metoprolol  100 mg daily. Is out of BB will refill. Continue current medications   Orders: -     Metoprolol  Succinate ER; Take 1 tablet (100 mg total) by mouth daily. Take with or immediately following a meal.  Dispense: 90 tablet; Refill: 1  Obesity, morbid (HCC) Assessment & Plan: Currently on mounjaro  with continue weight loss 15 pound weight loss since visit 4 months ago. Continue mounjaro  for diabetes, continue exercise and healthy diet.    Hyperlipidemia associated with type 2 diabetes mellitus (HCC) Assessment & Plan: Well controlled on repatha , statin, and fenofibrate   Coronary artery disease involving coronary bypass graft of native heart with angina pectoris Assessment & Plan: Not currently having anginal symptoms, is not taking imdur AE when he takes it, will have him follow up with cardiology regarding this. Is also on Cialis  which I have asked him to stop.    ED (erectile dysfunction) of organic origin Assessment & Plan: Feels cialis  not helping with discontinue given he is also on nitrates for heart    Adhesive capsulitis of left shoulder Assessment & Plan: Tightness for the last few months, has stopped working out due to this. Recommended home  exercise, follow up with ortho if not helping may benefit from corticosteroid injection.       Return in about 4 months (around 08/18/2024) for DM.    Harlene Saddler, MD "

## 2024-04-20 NOTE — Assessment & Plan Note (Signed)
 Not currently having anginal symptoms, is not taking imdur AE when he takes it, will have him follow up with cardiology regarding this. Is also on Cialis  which I have asked him to stop.

## 2024-04-26 ENCOUNTER — Other Ambulatory Visit: Payer: Self-pay

## 2024-04-26 MED ORDER — REPATHA SURECLICK 140 MG/ML ~~LOC~~ SOAJ: 140.0000 mg | SUBCUTANEOUS | 3 refills | Status: AC

## 2024-08-21 ENCOUNTER — Ambulatory Visit: Admitting: Student
